# Patient Record
Sex: Female | Born: 1950
Health system: Southern US, Community
[De-identification: ages and names within clinical notes are randomized; demographics above are authoritative.]

## PROBLEM LIST (undated history)

## (undated) DIAGNOSIS — K219 Gastro-esophageal reflux disease without esophagitis: Secondary | ICD-10-CM

## (undated) DIAGNOSIS — B029 Zoster without complications: Secondary | ICD-10-CM

## (undated) DIAGNOSIS — I2510 Atherosclerotic heart disease of native coronary artery without angina pectoris: Secondary | ICD-10-CM

## (undated) DIAGNOSIS — M2550 Pain in unspecified joint: Secondary | ICD-10-CM

## (undated) DIAGNOSIS — R600 Localized edema: Secondary | ICD-10-CM

## (undated) DIAGNOSIS — G479 Sleep disorder, unspecified: Secondary | ICD-10-CM

## (undated) DIAGNOSIS — J439 Emphysema, unspecified: Secondary | ICD-10-CM

## (undated) DIAGNOSIS — E785 Hyperlipidemia, unspecified: Secondary | ICD-10-CM

## (undated) DIAGNOSIS — R0602 Shortness of breath: Secondary | ICD-10-CM

## (undated) DIAGNOSIS — M8580 Other specified disorders of bone density and structure, unspecified site: Secondary | ICD-10-CM

## (undated) DIAGNOSIS — K649 Unspecified hemorrhoids: Secondary | ICD-10-CM

## (undated) DIAGNOSIS — M549 Dorsalgia, unspecified: Secondary | ICD-10-CM

## (undated) DIAGNOSIS — K589 Irritable bowel syndrome without diarrhea: Secondary | ICD-10-CM

## (undated) DIAGNOSIS — I8390 Asymptomatic varicose veins of unspecified lower extremity: Secondary | ICD-10-CM

## (undated) DIAGNOSIS — E559 Vitamin D deficiency, unspecified: Secondary | ICD-10-CM

## (undated) DIAGNOSIS — J449 Chronic obstructive pulmonary disease, unspecified: Secondary | ICD-10-CM

## (undated) DIAGNOSIS — E039 Hypothyroidism, unspecified: Secondary | ICD-10-CM

## (undated) DIAGNOSIS — I10 Essential (primary) hypertension: Secondary | ICD-10-CM

## (undated) DIAGNOSIS — R609 Edema, unspecified: Secondary | ICD-10-CM

## (undated) DIAGNOSIS — M1990 Unspecified osteoarthritis, unspecified site: Secondary | ICD-10-CM

## (undated) DIAGNOSIS — I509 Heart failure, unspecified: Secondary | ICD-10-CM

## (undated) DIAGNOSIS — R251 Tremor, unspecified: Secondary | ICD-10-CM

## (undated) DIAGNOSIS — H3530 Unspecified macular degeneration: Secondary | ICD-10-CM

## (undated) HISTORY — PX: COLONOSCOPY: SHX174

## (undated) HISTORY — PX: OVARIAN CYST REMOVAL: SHX89

## (undated) HISTORY — PX: JOINT REPLACEMENT: SHX530

## (undated) HISTORY — DX: Localized edema: R60.0

## (undated) HISTORY — DX: Emphysema, unspecified: J43.9

## (undated) HISTORY — DX: Chronic obstructive pulmonary disease, unspecified: J44.9

## (undated) HISTORY — DX: Shortness of breath: R06.02

## (undated) HISTORY — DX: Other specified disorders of bone density and structure, unspecified site: M85.80

## (undated) HISTORY — DX: Unspecified macular degeneration: H35.30

## (undated) HISTORY — DX: Dorsalgia, unspecified: M54.9

## (undated) HISTORY — DX: Unspecified osteoarthritis, unspecified site: M19.90

## (undated) HISTORY — DX: Unspecified hemorrhoids: K64.9

## (undated) HISTORY — PX: INCISION / DRAINAGE HAND / FINGER: SUR695

## (undated) HISTORY — DX: Vitamin D deficiency, unspecified: E55.9

## (undated) HISTORY — DX: Pain in unspecified joint: M25.50

## (undated) HISTORY — DX: Irritable bowel syndrome, unspecified: K58.9

## (undated) HISTORY — DX: Gastro-esophageal reflux disease without esophagitis: K21.9

## (undated) HISTORY — DX: Hypothyroidism, unspecified: E03.9

## (undated) HISTORY — PX: CATARACT EXTRACTION: SUR2

## (undated) HISTORY — PX: ROTATOR CUFF REPAIR: SHX139

## (undated) HISTORY — PX: ESOPHAGOGASTRODUODENOSCOPY: SHX1529

## (undated) HISTORY — DX: Hyperlipidemia, unspecified: E78.5

## (undated) HISTORY — DX: Zoster without complications: B02.9

## (undated) HISTORY — DX: Asymptomatic varicose veins of unspecified lower extremity: I83.90

---
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
## 1992-08-17 HISTORY — PX: CHOLECYSTECTOMY: SHX55

## 1998-02-27 ENCOUNTER — Ambulatory Visit (HOSPITAL_COMMUNITY): Admission: RE | Admit: 1998-02-27 | Discharge: 1998-02-27 | Payer: Self-pay | Admitting: Obstetrics & Gynecology

## 1998-05-13 ENCOUNTER — Other Ambulatory Visit: Admission: RE | Admit: 1998-05-13 | Discharge: 1998-05-13 | Payer: Self-pay | Admitting: Obstetrics & Gynecology

## 1998-08-17 HISTORY — PX: ABDOMINAL HYSTERECTOMY: SHX81

## 2002-08-17 DIAGNOSIS — B029 Zoster without complications: Secondary | ICD-10-CM

## 2002-08-17 HISTORY — PX: ERCP: SHX60

## 2002-08-17 HISTORY — DX: Zoster without complications: B02.9

## 2006-11-24 ENCOUNTER — Ambulatory Visit (HOSPITAL_COMMUNITY): Admission: RE | Admit: 2006-11-24 | Discharge: 2006-11-24 | Payer: Self-pay | Admitting: Gastroenterology

## 2007-01-06 ENCOUNTER — Ambulatory Visit: Payer: Self-pay | Admitting: Internal Medicine

## 2007-01-27 ENCOUNTER — Encounter: Payer: Self-pay | Admitting: Internal Medicine

## 2007-01-27 ENCOUNTER — Ambulatory Visit: Payer: Self-pay | Admitting: Internal Medicine

## 2007-01-27 DIAGNOSIS — K2100 Gastro-esophageal reflux disease with esophagitis, without bleeding: Secondary | ICD-10-CM | POA: Insufficient documentation

## 2007-06-10 ENCOUNTER — Ambulatory Visit (HOSPITAL_COMMUNITY): Admission: RE | Admit: 2007-06-10 | Discharge: 2007-06-10 | Payer: Self-pay | Admitting: Orthopedic Surgery

## 2007-07-27 ENCOUNTER — Emergency Department (HOSPITAL_COMMUNITY): Admission: EM | Admit: 2007-07-27 | Discharge: 2007-07-27 | Payer: Self-pay | Admitting: Family Medicine

## 2008-01-06 ENCOUNTER — Telehealth: Payer: Self-pay | Admitting: Internal Medicine

## 2008-04-30 ENCOUNTER — Emergency Department (HOSPITAL_COMMUNITY): Admission: EM | Admit: 2008-04-30 | Discharge: 2008-04-30 | Payer: Self-pay | Admitting: Emergency Medicine

## 2008-11-06 ENCOUNTER — Ambulatory Visit (HOSPITAL_COMMUNITY): Admission: RE | Admit: 2008-11-06 | Discharge: 2008-11-06 | Payer: Self-pay | Admitting: Orthopedic Surgery

## 2008-11-07 ENCOUNTER — Inpatient Hospital Stay (HOSPITAL_COMMUNITY): Admission: RE | Admit: 2008-11-07 | Discharge: 2008-11-09 | Payer: Self-pay | Admitting: Orthopedic Surgery

## 2009-01-31 ENCOUNTER — Ambulatory Visit (HOSPITAL_COMMUNITY): Admission: RE | Admit: 2009-01-31 | Discharge: 2009-01-31 | Payer: Self-pay | Admitting: Gastroenterology

## 2009-01-31 ENCOUNTER — Encounter (INDEPENDENT_AMBULATORY_CARE_PROVIDER_SITE_OTHER): Payer: Self-pay | Admitting: Gastroenterology

## 2009-05-14 ENCOUNTER — Emergency Department (HOSPITAL_COMMUNITY): Admission: EM | Admit: 2009-05-14 | Discharge: 2009-05-14 | Payer: Self-pay | Admitting: Family Medicine

## 2009-10-28 ENCOUNTER — Emergency Department (HOSPITAL_COMMUNITY): Admission: EM | Admit: 2009-10-28 | Discharge: 2009-10-28 | Payer: Self-pay | Admitting: Emergency Medicine

## 2009-11-18 ENCOUNTER — Ambulatory Visit (HOSPITAL_COMMUNITY): Admission: RE | Admit: 2009-11-18 | Discharge: 2009-11-18 | Payer: Self-pay | Admitting: Orthopedic Surgery

## 2009-12-13 ENCOUNTER — Ambulatory Visit (HOSPITAL_COMMUNITY): Admission: RE | Admit: 2009-12-13 | Discharge: 2009-12-13 | Payer: Self-pay | Admitting: Orthopedic Surgery

## 2009-12-18 ENCOUNTER — Encounter: Admission: RE | Admit: 2009-12-18 | Discharge: 2010-03-10 | Payer: Self-pay | Admitting: Orthopedic Surgery

## 2010-02-26 ENCOUNTER — Ambulatory Visit (HOSPITAL_COMMUNITY): Admission: RE | Admit: 2010-02-26 | Discharge: 2010-02-26 | Payer: Self-pay | Admitting: Orthopedic Surgery

## 2010-06-17 HISTORY — PX: TOTAL HIP ARTHROPLASTY: SHX124

## 2010-06-18 ENCOUNTER — Inpatient Hospital Stay (HOSPITAL_COMMUNITY)
Admission: RE | Admit: 2010-06-18 | Discharge: 2010-06-22 | Payer: Self-pay | Source: Home / Self Care | Admitting: Orthopedic Surgery

## 2010-07-24 ENCOUNTER — Encounter
Admission: RE | Admit: 2010-07-24 | Discharge: 2010-08-14 | Payer: Self-pay | Source: Home / Self Care | Attending: Orthopedic Surgery | Admitting: Orthopedic Surgery

## 2010-08-18 ENCOUNTER — Encounter
Admission: RE | Admit: 2010-08-18 | Discharge: 2010-09-16 | Payer: Self-pay | Source: Home / Self Care | Attending: Orthopedic Surgery | Admitting: Orthopedic Surgery

## 2010-08-22 ENCOUNTER — Encounter: Admit: 2010-08-22 | Payer: Self-pay | Admitting: Orthopedic Surgery

## 2010-08-26 ENCOUNTER — Encounter: Admit: 2010-08-26 | Payer: Self-pay | Admitting: Orthopedic Surgery

## 2010-08-29 ENCOUNTER — Encounter: Admit: 2010-08-29 | Payer: Self-pay | Admitting: Orthopedic Surgery

## 2010-09-07 ENCOUNTER — Encounter: Payer: Self-pay | Admitting: Gastroenterology

## 2010-09-15 ENCOUNTER — Encounter
Admit: 2010-09-15 | Discharge: 2010-09-16 | Payer: Self-pay | Attending: Orthopedic Surgery | Admitting: Orthopedic Surgery

## 2010-09-17 ENCOUNTER — Ambulatory Visit: Payer: Commercial Managed Care - PPO | Attending: Orthopedic Surgery | Admitting: Physical Therapy

## 2010-09-17 ENCOUNTER — Ambulatory Visit: Payer: Commercial Managed Care - PPO | Admitting: Physical Therapy

## 2010-09-17 ENCOUNTER — Ambulatory Visit: Payer: Self-pay | Admitting: Physical Therapy

## 2010-09-17 ENCOUNTER — Encounter: Payer: Self-pay | Admitting: Physical Therapy

## 2010-09-17 DIAGNOSIS — R262 Difficulty in walking, not elsewhere classified: Secondary | ICD-10-CM | POA: Insufficient documentation

## 2010-09-17 DIAGNOSIS — IMO0001 Reserved for inherently not codable concepts without codable children: Secondary | ICD-10-CM | POA: Insufficient documentation

## 2010-09-17 DIAGNOSIS — M25579 Pain in unspecified ankle and joints of unspecified foot: Secondary | ICD-10-CM | POA: Insufficient documentation

## 2010-09-22 ENCOUNTER — Encounter: Payer: Self-pay | Admitting: Physical Therapy

## 2010-09-24 ENCOUNTER — Ambulatory Visit: Payer: Commercial Managed Care - PPO | Admitting: Physical Therapy

## 2010-09-24 ENCOUNTER — Encounter: Payer: Self-pay | Admitting: Physical Therapy

## 2010-10-28 LAB — BASIC METABOLIC PANEL WITH GFR
BUN: 9 mg/dL (ref 6–23)
CO2: 30 meq/L (ref 19–32)
Calcium: 8.5 mg/dL (ref 8.4–10.5)
Chloride: 105 meq/L (ref 96–112)
Creatinine, Ser: 0.87 mg/dL (ref 0.4–1.2)
GFR calc non Af Amer: >60 mL/min
Glucose, Bld: 106 mg/dL — ABNORMAL HIGH (ref 70–99)
Potassium: 4.4 meq/L (ref 3.5–5.1)
Sodium: 140 meq/L (ref 135–145)

## 2010-10-28 LAB — CBC
HCT: 31.9 % — ABNORMAL LOW (ref 36.0–46.0)
HCT: 32.5 % — ABNORMAL LOW (ref 36.0–46.0)
Hemoglobin: 10.1 g/dL — ABNORMAL LOW (ref 12.0–15.0)
Hemoglobin: 10.9 g/dL — ABNORMAL LOW (ref 12.0–15.0)
Hemoglobin: 11.2 g/dL — ABNORMAL LOW (ref 12.0–15.0)
MCH: 31.6 pg (ref 26.0–34.0)
MCH: 32.2 pg (ref 26.0–34.0)
MCHC: 33.9 g/dL (ref 30.0–36.0)
MCHC: 34.2 g/dL (ref 30.0–36.0)
MCV: 93.2 fL (ref 78.0–100.0)
MCV: 93.4 fL (ref 78.0–100.0)
MCV: 93.4 fL (ref 78.0–100.0)
RBC: 3.2 MIL/uL — ABNORMAL LOW (ref 3.87–5.11)
RBC: 3.48 MIL/uL — ABNORMAL LOW (ref 3.87–5.11)
RDW: 13.1 % (ref 11.5–15.5)

## 2010-10-28 LAB — TYPE AND SCREEN
ABO/RH(D): B POS
Antibody Screen: NEGATIVE

## 2010-10-28 LAB — PROTIME-INR
INR: 1.08 (ref 0.00–1.49)
INR: 2.27 — ABNORMAL HIGH (ref 0.00–1.49)

## 2010-10-28 LAB — BASIC METABOLIC PANEL
BUN: 10 mg/dL (ref 6–23)
CO2: 28 mEq/L (ref 19–32)
Chloride: 105 mEq/L (ref 96–112)
Chloride: 108 mEq/L (ref 96–112)
GFR calc Af Amer: >60 mL/min (ref >60)
GFR calc non Af Amer: >60 mL/min (ref >60)
Glucose, Bld: 100 mg/dL — ABNORMAL HIGH (ref 70–99)
Glucose, Bld: 117 mg/dL — ABNORMAL HIGH (ref 70–99)
Potassium: 6 mEq/L — ABNORMAL HIGH (ref 3.5–5.1)
Sodium: 138 mEq/L (ref 135–145)
Sodium: 139 mEq/L (ref 135–145)

## 2010-10-28 LAB — HEMOGLOBIN AND HEMATOCRIT, BLOOD
HCT: 31.8 % — ABNORMAL LOW (ref 36.0–46.0)
Hemoglobin: 10.8 g/dL — ABNORMAL LOW (ref 12.0–15.0)

## 2010-10-28 LAB — URINALYSIS, ROUTINE W REFLEX MICROSCOPIC
Bilirubin Urine: NEGATIVE
Glucose, UA: NEGATIVE mg/dL
Ketones, ur: NEGATIVE mg/dL
pH: 5.5 (ref 5.0–8.0)

## 2010-10-28 LAB — ABO/RH: ABO/RH(D): B POS

## 2010-10-29 LAB — CBC
HCT: 43 % (ref 36.0–46.0)
MCV: 92.2 fL (ref 78.0–100.0)
Platelets: 296 10*3/uL (ref 150–400)
RBC: 4.66 MIL/uL (ref 3.87–5.11)
WBC: 6.6 10*3/uL (ref 4.0–10.5)

## 2010-10-29 LAB — URINALYSIS, ROUTINE W REFLEX MICROSCOPIC
Bilirubin Urine: NEGATIVE
Glucose, UA: NEGATIVE mg/dL
Hgb urine dipstick: NEGATIVE
Specific Gravity, Urine: 1.022 (ref 1.005–1.030)
Urobilinogen, UA: 0.2 mg/dL (ref 0.0–1.0)
pH: 5.5 (ref 5.0–8.0)

## 2010-10-29 LAB — URINE MICROSCOPIC-ADD ON

## 2010-10-29 LAB — COMPREHENSIVE METABOLIC PANEL
ALT: 15 U/L (ref 0–35)
Albumin: 3.8 g/dL (ref 3.5–5.2)
Alkaline Phosphatase: 92 U/L (ref 39–117)
BUN: 14 mg/dL (ref 6–23)
Chloride: 104 mEq/L (ref 96–112)
Glucose, Bld: 91 mg/dL (ref 70–99)
Potassium: 4.6 mEq/L (ref 3.5–5.1)
Sodium: 138 mEq/L (ref 135–145)
Total Bilirubin: 0.4 mg/dL (ref 0.3–1.2)

## 2010-10-29 LAB — SURGICAL PCR SCREEN
MRSA, PCR: NEGATIVE
Staphylococcus aureus: NEGATIVE

## 2010-10-29 LAB — PROTIME-INR: INR: 0.96 (ref 0.00–1.49)

## 2010-11-17 ENCOUNTER — Other Ambulatory Visit: Payer: Self-pay | Admitting: Obstetrics & Gynecology

## 2010-11-17 DIAGNOSIS — R928 Other abnormal and inconclusive findings on diagnostic imaging of breast: Secondary | ICD-10-CM

## 2010-11-18 ENCOUNTER — Ambulatory Visit
Admission: RE | Admit: 2010-11-18 | Discharge: 2010-11-18 | Disposition: A | Discharge status: Home / Self Care | Payer: Commercial Managed Care - PPO | Source: Ambulatory Visit | Attending: Obstetrics & Gynecology | Admitting: Obstetrics & Gynecology

## 2010-11-18 DIAGNOSIS — R928 Other abnormal and inconclusive findings on diagnostic imaging of breast: Secondary | ICD-10-CM

## 2010-11-27 LAB — GRAM STAIN

## 2010-11-27 LAB — ANAEROBIC CULTURE

## 2010-11-27 LAB — WOUND CULTURE: Culture: NO GROWTH

## 2010-11-27 LAB — CBC
HCT: 41.4 % (ref 36.0–46.0)
Hemoglobin: 14.1 g/dL (ref 12.0–15.0)
MCHC: 34 g/dL (ref 30.0–36.0)
MCV: 92.4 fL (ref 78.0–100.0)
Platelets: 243 10*3/uL (ref 150–400)
RBC: 4.48 MIL/uL (ref 3.87–5.11)
RDW: 13.5 % (ref 11.5–15.5)
WBC: 7 10*3/uL (ref 4.0–10.5)

## 2010-12-30 NOTE — Op Note (Signed)
NAMEMARGARETMARY, [name]               ACCOUNT NO.:  [id number]   MEDICAL RECORD NO.:  [id number]          PATIENT TYPE:  AMB   LOCATION:  ENDO                         FACILITY:  [facility]   PHYSICIAN:  Anselmo Rod, M.D.  DATE OF BIRTH:  Nov 26, 1950   DATE OF PROCEDURE:  01/31/2009  DATE OF DISCHARGE:  01/31/2009                               OPERATIVE REPORT   PROCEDURE PERFORMED:  Colonoscopy with snare polypectomy x 2.   ENDOSCOPIST:  Anselmo Rod, M.D.   INSTRUMENT USED:  Pentax video colonoscope.   INDICATIONS FOR PROCEDURE:  A 60 year old white female with a personal  history of colonic polyps, underwent surveillance colonoscopy to rule  out recurrent colonic polyps.   PREPROCEDURE PREPARATION:  Informed consent was procured from the  patient. The patient was fasted for 4 hours prior to the procedure and  prepped with Moviprep the night of and the morning of the procedure.  Risks and benefits of the procedure including a 10% miss rate of polyps  and cancer were discussed with the patient as well.   PREPROCEDURE PHYSICAL:  The patient had stable vital signs.  NECK:  Supple.  CHEST:  Clear to auscultation. S1-S2 regular.  ABDOMEN:  Soft with normal bowel sounds.   DESCRIPTION OF PROCEDURE:  The patient was placed in a left lateral  decubitus position and sedated with 325 mg of Propofol, 500 mcg of  Alfentanil and 2.5 mg of Versed.  Once the patient was adequately  sedated and maintained on low-flow oxygen and continuous cardiac  monitoring, the Pentax video colonoscope was advanced from the rectum to  the cecum without difficulty.  The patient had a good prep.  The  appendiceal orifice and ileocecal valve were clearly visualized and  photographed.  The terminal ileum appeared healthy.  The ascending  colon, transverse colon, left colon, and the rectosigmoid colon appeared  normal.  Two small flat rectal polyps were removed by hot snare  (150/50).  Six small sessile rectal  polyps were ablated with the tip of  the snare.  Retroflexion in the rectum revealed no abnormalities.  The  patient tolerated the procedure well without any immediate  complications.   IMPRESSION:  Two rectal polyps removed by hot snare and 6 small sessile  rectal polyps ablated with the tip of the snare.  Otherwise, normal  colonoscopy to the terminal ileum.   RECOMMENDATIONS:  1. Await pathology results.  2. Avoid all nonsteroidals including aspirin for the next 2 weeks.  3. Repeat colonoscopy depending on pathology results, most probably      within the next 5 years  4. Outpatient followup as need arises in the future.      Anselmo Rod, M.D.  Electronically Signed     JNM/MEDQ  D:  02/01/2009  T:  02/01/2009  Job:  [id number]   cc:   Gretta Arab. Valentina Lucks, M.D.  Fax: [phone]

## 2010-12-30 NOTE — Op Note (Signed)
Annette [name], Annette [name]               ACCOUNT NO.:  [id number]   MEDICAL RECORD NO.:  [id number]          PATIENT TYPE:  INP   LOCATION:  5016                         FACILITY:  [facility]   PHYSICIAN:  Artist Pais. Weingold, M.D.DATE OF BIRTH:  12-15-1950   DATE OF PROCEDURE:  11/07/2008  DATE OF DISCHARGE:                               OPERATIVE REPORT   PREOPERATIVE DIAGNOSIS:  Left index finger flexor sheath infection.   POSTOPERATIVE DIAGNOSIS:  Left index finger flexor sheath infection.   PROCEDURE:  Incision and drainage, left index finger flexor sheath with  cultures and placement of indwelling catheter.   SURGEON:  Artist Pais. Mina Marble, MD   ASSISTANT:  None.   ANESTHESIA:  General.   TOURNIQUET TIME:  21 minutes.   COMPLICATIONS:  No complication.   DRAINS:  No drains.   One indwelling catheter placed.   Cultures x2 sent.   OPERATIVE REPORT:  The patient was taken to the operating suite.  After  induction of adequate general anesthesia, left upper extremity was  prepped and draped in usual sterile fashion.  An Esmarch was used to  exsanguinate the limb.  Tourniquet was then inflated to 250 mmHg.  At  this point in time, an incision was made over the A1 pulley area of the  left index finger.  Skin was incised.  Neurovascular bundle was  carefully identified and retracted.  Once the flexor sheath was entered,  a cloudy fluid was encountered, and it was cultured.  The edge of the A1  pulley was identified.  At this point in time, an incision was made mid  lateral over the ulnar side of the distal interphalangeal joint, index  finger, left.  Dissection was carried down the flexor sheath and just  distal to the A4 pulley, sheath was entered.  Cloudy fluid was seen  there as well.  At this point in time, a #5 pediatric feeding tube was  placed in the flexor sheath, and the flexor sheath was irrigated with  300 mL of normal saline.  Also, this was  then followed by 10 mL of  Marcaine.  The proximal wound was closed with  4-0 nylon as well as the distal wound loosely to allow drainage.  The  patient was then dressed with Xeroform, 4x4s fluffs, and a compressive  dressing.  The patient tolerated the procedure well and taken to  recovery in stable fashion.      Artist Pais Mina Marble, M.D.  Electronically Signed     MAW/MEDQ  D:  11/07/2008  T:  11/08/2008  Job:  [id number]

## 2010-12-30 NOTE — Assessment & Plan Note (Signed)
[facility]                         GASTROENTEROLOGY OFFICE NOTE   SHAWNE, BULOW                      MRN:          [id number]  DATE:01/06/2007                            DOB:          03/07/1951    [name] is a 60 year old, white female.  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  She comes today with several GI  issues.  One is loose stools like sludge that occur two to three times a  day which is different from her usual bowel habits of having formed  stool daily.  She also has bouts of diarrhea.  This has been occurring  now off and on for several months.  Another complaint has been increased  gastroesophageal reflux which occurs mostly at night and wakes her up.  This usually results in esophageal spasm.  Several years ago, cardiac  evaluation of chest pain did not show any primary cardiac problems and  the patient was diagnosed with esophageal spasm.  She occasionally takes  Pepcid AC or antacids.  Her weight has recently increased from her usual  165 pounds to 197 pounds since she stopped smoking 8 months ago.  She  has had increased stress with her elderly parents.  The third problem  has been a right upper quadrant pressure which occurs 1-2 days at a time  and results in loose stools.  This is localized anteriorly and does not  radiate to the back.  [name] has a history of jaundice which  occurred in 2004.  She recalled uncomplicated cholecystectomy for  cholelithiasis in 1992, and underwent ERCP in 2004, with findings of a  sludge in the common bile duct.  It is not clear whether she had a  sphincterotomy at that time.  She has not had any recurrence of the  symptoms since then.  Upper endoscopy in 1992, apparently was normal.   MEDICATIONS:  None.   PAST MEDICAL HISTORY:  Arthritis.   PAST SURGICAL HISTORY:  1. Cholecystectomy.  2. Hysterectomy.  3. Inguinal hernia repair.   FAMILY HISTORY:   Positive for heart disease in her mother and father.  Prostate cancer in father.  Breast cancer in sister.  Alcoholism in  mother.   SOCIAL HISTORY:  Married with one child.  She works as a Engineer, civil (consulting).  She  smokes.  She drinks alcohol rarely.   REVIEW OF SYSTEMS:  Positive for skin rash, night sweats, new headaches,  no chills or musculoskeletal pains.   PHYSICAL EXAMINATION:  VITAL SIGNS:  Blood pressure 118/82, pulse 64,  weight 197 pounds.  GENERAL:  She was alert and oriented in no distress.  LUNGS:  No cough or hoarseness.  Clear to auscultation.  CARDIAC:  S1, S2.  ABDOMEN:  Soft with normoactive bowel sounds.  Mild tenderness in the  left lower quadrant above the inguinal area.  No recurrent hernia.  No  distention.  Liver edge at costal margin.  Post laparoscopic  cholecystectomy scar well-healed.  RECTAL:  Normal rectal tone.  Stool was Hemoccult negative.   IMPRESSION:  82. A 60 year old,  white female with variety of digestive symptoms.      She has a gastroesophageal reflux disease which needs to be treated      with proton pump inhibitor.  This could have been precipitated by      stress as well as by weight gain.  2. Right upper quadrant discomfort.  This could be due to irritable      bowel syndrome.  There is a history of jaundice due to extrahepatic      biliary problems.  This should have been taken care of by      endoscopic retrograde cholangiopancreatography and possible      sphincterotomy.  We need to obtain the records from [place] to      review that.  3. Change in bowel habits.  This needs to be further evaluated.      Recent CT scan of the abdomen showed questionable swelling or edema      of the small bowel wall suggestive of inflammatory bowel disease,      but her terminal ileum was normal.  Her ultrasound of the upper      abdomen within the last 3 weeks was unremarkable with normal size      common bile duct.   PLAN:  1. Nexium 40 mg daily.  Samples  as well as prescription given.  2. Levsin sublingual 0.25 mg every 4-6 hours p.r.n. abdominal      discomfort.  3. Request records from ERCP and endoscopy from [facility].  4. Schedule upper endoscopy.  5. Chantix package x3 months to stop smoking.  6. Xanax 0.25 mg dispense #30, one p.o. p.r.n.  7. She may need further evaluation depending on the review of the      records from [facility].     Dora M. Juanda Chance, MD  Electronically Signed    DMB/MedQ  DD: 01/06/2007  DT: 01/06/2007  Job #: [id number]

## 2011-09-11 ENCOUNTER — Encounter: Payer: Self-pay | Admitting: Gastroenterology

## 2011-10-15 ENCOUNTER — Other Ambulatory Visit: Payer: Self-pay | Admitting: Obstetrics & Gynecology

## 2011-10-15 DIAGNOSIS — N6009 Solitary cyst of unspecified breast: Secondary | ICD-10-CM

## 2011-11-23 ENCOUNTER — Ambulatory Visit
Admission: RE | Admit: 2011-11-23 | Discharge: 2011-11-23 | Disposition: A | Discharge status: Home / Self Care | Payer: 59 | Source: Ambulatory Visit | Attending: Obstetrics & Gynecology | Admitting: Obstetrics & Gynecology

## 2011-11-23 DIAGNOSIS — N6009 Solitary cyst of unspecified breast: Secondary | ICD-10-CM

## 2012-03-15 ENCOUNTER — Telehealth: Payer: Self-pay

## 2012-03-15 NOTE — Telephone Encounter (Signed)
Delorse Lek (now known as Leanord Hawking). 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 Has had colonoscopy previously with Dr. Charna Elizabeth, feels she will be due for another in about a year. Can you get records sent from Dr. Kenna Gilbert office, previous colonoscopies, any associated pathology reports for me to review, determine proper timing of next exam. Her contact number is [phone] Thanks    Pt needs to fill out Release Left message on machine to call back

## 2012-03-17 NOTE — Telephone Encounter (Signed)
The pt returned call and will have records sent here for review

## 2012-04-19 ENCOUNTER — Other Ambulatory Visit: Payer: Self-pay | Admitting: Obstetrics & Gynecology

## 2012-04-19 DIAGNOSIS — N6489 Other specified disorders of breast: Secondary | ICD-10-CM

## 2012-04-19 DIAGNOSIS — N6459 Other signs and symptoms in breast: Secondary | ICD-10-CM

## 2012-04-21 ENCOUNTER — Ambulatory Visit
Admission: RE | Admit: 2012-04-21 | Discharge: 2012-04-21 | Disposition: A | Discharge status: Home / Self Care | Payer: 59 | Source: Ambulatory Visit | Attending: Obstetrics & Gynecology | Admitting: Obstetrics & Gynecology

## 2012-04-21 DIAGNOSIS — N6459 Other signs and symptoms in breast: Secondary | ICD-10-CM

## 2012-04-21 DIAGNOSIS — N6489 Other specified disorders of breast: Secondary | ICD-10-CM

## 2012-09-13 ENCOUNTER — Other Ambulatory Visit (HOSPITAL_COMMUNITY): Payer: Self-pay | Admitting: Orthopedic Surgery

## 2012-09-13 DIAGNOSIS — T84038A Mechanical loosening of other internal prosthetic joint, initial encounter: Secondary | ICD-10-CM

## 2012-09-13 DIAGNOSIS — M25551 Pain in right hip: Secondary | ICD-10-CM

## 2012-09-22 ENCOUNTER — Encounter (HOSPITAL_COMMUNITY)
Admission: RE | Admit: 2012-09-22 | Discharge: 2012-09-22 | Disposition: A | Discharge status: Home / Self Care | Payer: 59 | Source: Ambulatory Visit | Attending: Orthopedic Surgery | Admitting: Orthopedic Surgery

## 2012-09-22 DIAGNOSIS — Z96649 Presence of unspecified artificial hip joint: Secondary | ICD-10-CM | POA: Insufficient documentation

## 2012-09-22 DIAGNOSIS — T84038A Mechanical loosening of other internal prosthetic joint, initial encounter: Secondary | ICD-10-CM

## 2012-09-22 DIAGNOSIS — M25559 Pain in unspecified hip: Secondary | ICD-10-CM | POA: Insufficient documentation

## 2012-09-22 DIAGNOSIS — M25551 Pain in right hip: Secondary | ICD-10-CM

## 2012-09-22 MED ORDER — TECHNETIUM TC 99M MEDRONATE IV KIT
25.0000 | PACK | Freq: Once | INTRAVENOUS | Status: AC | PRN
  Administered 2012-09-22: 25 via INTRAVENOUS

## 2012-12-21 ENCOUNTER — Other Ambulatory Visit (HOSPITAL_COMMUNITY): Payer: Self-pay | Admitting: Orthopedic Surgery

## 2012-12-21 DIAGNOSIS — M25551 Pain in right hip: Secondary | ICD-10-CM

## 2012-12-24 ENCOUNTER — Ambulatory Visit (HOSPITAL_COMMUNITY): Payer: 59

## 2012-12-26 ENCOUNTER — Ambulatory Visit (HOSPITAL_COMMUNITY): Payer: 59

## 2013-01-25 ENCOUNTER — Other Ambulatory Visit (HOSPITAL_COMMUNITY): Payer: Self-pay | Admitting: Orthopedic Surgery

## 2013-01-25 DIAGNOSIS — M25551 Pain in right hip: Secondary | ICD-10-CM

## 2013-01-27 ENCOUNTER — Ambulatory Visit (HOSPITAL_COMMUNITY)
Admission: RE | Admit: 2013-01-27 | Discharge: 2013-01-27 | Disposition: A | Discharge status: Home / Self Care | Payer: 59 | Source: Ambulatory Visit | Attending: Orthopedic Surgery | Admitting: Orthopedic Surgery

## 2013-01-27 DIAGNOSIS — M25551 Pain in right hip: Secondary | ICD-10-CM

## 2013-01-27 DIAGNOSIS — Z96649 Presence of unspecified artificial hip joint: Secondary | ICD-10-CM | POA: Insufficient documentation

## 2013-01-27 DIAGNOSIS — M1610 Unilateral primary osteoarthritis, unspecified hip: Secondary | ICD-10-CM | POA: Insufficient documentation

## 2013-01-27 DIAGNOSIS — M169 Osteoarthritis of hip, unspecified: Secondary | ICD-10-CM | POA: Insufficient documentation

## 2013-01-27 DIAGNOSIS — M25559 Pain in unspecified hip: Secondary | ICD-10-CM | POA: Insufficient documentation

## 2013-06-01 ENCOUNTER — Other Ambulatory Visit (INDEPENDENT_AMBULATORY_CARE_PROVIDER_SITE_OTHER): Payer: 59

## 2013-06-01 ENCOUNTER — Ambulatory Visit (INDEPENDENT_AMBULATORY_CARE_PROVIDER_SITE_OTHER): Payer: 59 | Admitting: Internal Medicine

## 2013-06-01 ENCOUNTER — Other Ambulatory Visit: Payer: 59

## 2013-06-01 ENCOUNTER — Encounter: Payer: Self-pay | Admitting: Internal Medicine

## 2013-06-01 VITALS — BP 110/70 | HR 66 | Ht 67.0 in | Wt 183.0 lb

## 2013-06-01 DIAGNOSIS — R198 Other specified symptoms and signs involving the digestive system and abdomen: Secondary | ICD-10-CM

## 2013-06-01 DIAGNOSIS — R1915 Other abnormal bowel sounds: Secondary | ICD-10-CM

## 2013-06-01 DIAGNOSIS — R197 Diarrhea, unspecified: Secondary | ICD-10-CM

## 2013-06-01 DIAGNOSIS — R109 Unspecified abdominal pain: Secondary | ICD-10-CM

## 2013-06-01 LAB — CBC WITH DIFFERENTIAL/PLATELET
Basophils Absolute: 0.1 10*3/uL (ref 0.0–0.1)
Eosinophils Absolute: 0.4 10*3/uL (ref 0.0–0.7)
Lymphocytes Relative: 23.9 % (ref 12.0–46.0)
MCHC: 34 g/dL (ref 30.0–36.0)
Neutrophils Relative %: 63.1 % (ref 43.0–77.0)
RDW: 13.5 % (ref 11.5–14.6)

## 2013-06-01 NOTE — Progress Notes (Signed)
Subjective:    Patient ID: Annette [name], female    DOB: 11-Nov-1950, 62 y.o.   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 She has tried align with slight benefit at best. She cannot identify triggers and denies significant stressors (new). No bleeding. Has had a colonoscopy 2012 with diminutive rectal hyperplastic polyps otherwise NL into terminal ileum.  Milk products do not bother her and she does not reall use, has tried gluten-free w/o benefit.  She has had IBS problems when under significant stress - especially at time of hu  Allergies  Allergen Reactions  . Augmentin [Amoxicillin-Pot Clavulanate]   . Codeine Other (See Comments)    Causes her to stay awake   . Other     IV contrast    Outpatient Prescriptions Prior to Visit  Medication Sig Dispense Refill  . azithromycin (ZITHROMAX) 1 G powder Take 1 packet by mouth once.       No facility-administered medications prior to visit.   Past Medical History  Diagnosis Date  . Arthritis     Right hip, end stage  . Shingles 2004  . Varicose veins   . Pneumonia   . Gallbladder disease   . Measles     as a child  . Mumps     as a child   Past Surgical History  Procedure Laterality Date  . Esophagogastroduodenoscopy    . Abdominal hysterectomy  2000  . Rotator cuff repair      left  . Cholecystectomy  1994  . Ovarian cyst removal  [id number]  . Total hip arthroplasty  06/2010    right  . Colonoscopy    . Ercp  2004   History   Social History  . Marital Status: Divorced    Spouse Name: N/A    Number of Children: 1  . Years of Education: RN   Occupational History  . RN [employer]   Social History Main Topics  . Smoking status: Former Games developer  . Smokeless tobacco: None  . Alcohol Use: Yes     Comment: social  . Drug Use: No    Family History  Problem Relation Age of Onset  .  Alzheimer's disease Father   . Stroke Mother        Review of Systems This is positive for those things mentiones in the HPI, also positive for  Arthritis, insomnia, voice change. All other review of systems are negative.      Objective:   Physical Exam General:  Well-developed, well-nourished and in no acute distress Eyes:  anicteric. ENT:   Mouth and posterior pharynx free of lesions.  Neck:   supple w/o thyromegaly or mass.  Lungs: Clear to auscultation bilaterally. Heart:  S1S2, no rubs, murmurs, gallops. Abdomen:  soft, non-tender, no hepatosplenomegaly, hernia, or mass and BS+.  Extremities:   no edema Skin   no rash. Neuro:  A&O x 3.  Psych:  appropriate mood and  Affect.   Data Reviewed: Prior [facility] GI notes, colonoscopy, pathology, labs    Assessment & Plan:  Frequent loose stools - Plan: CBC with Differential, Ova and parasite screen, Clostridium Difficile by PCR, Fecal lactoferrin  Borborygmi - Plan: CBC with Differential, Ova and parasite screen, Clostridium Difficile by PCR, Fecal lactoferrin  Abdominal cramps - Plan: CBC with Differential, Ova and parasite screen, Clostridium Difficile by PCR, Fecal lactoferrin  1. Most likely has IBS exacerbation but will look for infection/inflammation. 2. Pending these results consider empiric Abx (? SIBO), anti-cholinergics, other w/u.

## 2013-06-01 NOTE — Patient Instructions (Signed)
Your physician has requested that you go to the basement for lab work before leaving today.     I appreciate the opportunity to care for you.   

## 2013-06-02 LAB — OVA AND PARASITE SCREEN: OP: NONE SEEN

## 2013-06-02 LAB — FECAL LACTOFERRIN, QUANT: Lactoferrin: NEGATIVE

## 2013-06-03 ENCOUNTER — Encounter: Payer: Self-pay | Admitting: Internal Medicine

## 2013-06-04 LAB — CLOSTRIDIUM DIFFICILE BY PCR: Toxigenic C. Difficile by PCR: NOT DETECTED

## 2013-06-05 ENCOUNTER — Other Ambulatory Visit: Payer: Self-pay

## 2013-06-05 MED ORDER — DICYCLOMINE HCL 10 MG PO CAPS: 10.0000 mg | ORAL_CAPSULE | Freq: Three times a day (TID) | ORAL | Status: DC

## 2013-06-05 NOTE — Progress Notes (Signed)
Quick Note:  Let her know all tests negative - no signs infection or inflammation Suspect IBS problem  Try dicyclomine 20 mg before meals #90 1 refill Ask her to let me know how that is working in 1-2 weeks (call me or tell me when she sees me) This is anti-spasmodic to slow things down after she eats if too strong (sleepy, dry mouth) take 1/2 tab and could even start that way ______

## 2013-12-21 ENCOUNTER — Encounter: Payer: Self-pay | Admitting: Internal Medicine

## 2013-12-21 ENCOUNTER — Ambulatory Visit (INDEPENDENT_AMBULATORY_CARE_PROVIDER_SITE_OTHER): Payer: 59 | Admitting: Internal Medicine

## 2013-12-21 VITALS — BP 118/84 | HR 80 | Ht 66.5 in | Wt 193.2 lb

## 2013-12-21 DIAGNOSIS — K648 Other hemorrhoids: Secondary | ICD-10-CM

## 2013-12-21 DIAGNOSIS — K589 Irritable bowel syndrome without diarrhea: Secondary | ICD-10-CM | POA: Insufficient documentation

## 2013-12-21 MED ORDER — HYDROCORTISONE ACETATE 25 MG RE SUPP: 25.0000 mg | Freq: Every day | RECTAL | Status: DC

## 2013-12-21 MED ORDER — ALOSETRON HCL 0.5 MG PO TABS: 0.5000 mg | ORAL_TABLET | Freq: Two times a day (BID) | ORAL | Status: DC

## 2013-12-21 NOTE — Assessment & Plan Note (Signed)
These are present in all 3 positions, grade 2, she has the largest one in the left lateral decubitus position seen on anoscopy. I think banding hemorrhoids is an option and she is willing to do this. For now [facility] suppositories and then control the IBS diarrhea and then if still having problems consider hemorrhoid banding.

## 2013-12-21 NOTE — Assessment & Plan Note (Signed)
Dicyclomine helps but she has significant side effects. I think the loose stools are contributing to hemorrhoid problems. We discussed using Lotronex and she will try 0.5 mg twice a day. Informed consent and signed, I reviewed that she needs to stop this if she becomes constipated. Further plans pending clinical course, she will contact me over the next few weeks with the results of this therapeutic trial.

## 2013-12-21 NOTE — Patient Instructions (Addendum)
Today you have read/signed a consent for Lotronex and been given a written rx for this.  Stop your generic bentyl.  We have sent the following medications to your pharmacy for you to pick up at your convenience: [facility]   Let Dr. Carlean Purl know how your doing when you see him at the hospital.   I appreciate the opportunity to care for you.

## 2013-12-21 NOTE — Progress Notes (Signed)
Subjective:    Patient ID: Annette [name], female    DOB: [birth year], 63 y.o.   MRN: [id number]  HPI The patient is here because of concerns about her rectal mass. She does have known hemorrhoids and swelling of the normal dose and is seeing some bright red blood on the tissue paper. She continues to have urgent postprandial defecation and loose stools it is helped by dicyclomine but dicyclomine makes her very sleepy. Her quality of life and social life is significantly impaired by this. She had a colonoscopy in 2010 with 2 rectal hyperplastic polyps. Allergies  Allergen Reactions  . Augmentin [Amoxicillin-Pot Clavulanate] Nausea And Vomiting  . Codeine Other (See Comments)    Causes her to stay awake   . Other Hives    IV contrast    Outpatient Prescriptions Prior to Visit  Medication Sig Dispense Refill  . levothyroxine (SYNTHROID, LEVOTHROID) 75 MCG tablet Take 75 mcg by mouth daily before breakfast.      . propranolol (INDERAL) 60 MG tablet Take 60 mg by mouth daily.      Marland Kitchen dicyclomine (BENTYL) 10 MG capsule Take 1 capsule (10 mg total) by mouth 3 (three) times daily before meals.  90 capsule  1   No facility-administered medications prior to visit.   Past Medical History  Diagnosis Date  . Arthritis     Right hip, end stage  . Shingles 2004  . Varicose veins   . Pneumonia   . Gallbladder disease   . Measles     as a child  . Mumps     as a child  . Hypothyroidism   . Irritable bowel syndrome   . Hemorrhoids    Past Surgical History  Procedure Laterality Date  . Esophagogastroduodenoscopy    . Abdominal hysterectomy  2000  . Rotator cuff repair      left  . Cholecystectomy  1994  . Ovarian cyst removal  [id number]  . Total hip arthroplasty  06/2010    right  . Colonoscopy    . Ercp  2004    sludge in CBD (jaundiced)   Review of Systems As above    Objective:   Physical Exam Well-developed nourished no acute distress  Rectal exam with Patti  Martinique CMA present demonstrates some anal tags, otherwise normal anoderm. There is soft stool in the vault, it is brown. No tenderness. Normal descent with Valsalva normal resting tone.  Anoscopy is performed after verbal informed consent and it demonstrates regular hemorrhoids in all positions the most prominent left lateral decubitus.       Assessment & Plan:  IBS (irritable bowel syndrome) Dicyclomine helps but she has significant side effects. I think the loose stools are contributing to hemorrhoid problems. We discussed using Lotronex and she will try 0.5 mg twice a day. Informed consent and signed, I reviewed that she needs to stop this if she becomes constipated. Further plans pending clinical course, she will contact me over the next few weeks with the results of this therapeutic trial.  Internal hemorrhoids with prolapse and bleeding These are present in all 3 positions, grade 2, she has the largest one in the left lateral decubitus position seen on anoscopy. I think banding hemorrhoids is an option and she is willing to do this. For now [facility] suppositories and then control the IBS diarrhea and then if still having problems consider hemorrhoid banding.    Meds ordered this encounter  Medications  . alosetron (LOTRONEX) 0.5  MG tablet    Sig: Take 1 tablet (0.5 mg total) by mouth 2 (two) times daily.    Dispense:  60 tablet    Refill:  1  . hydrocortisone (ANUSOL-HC) 25 MG suppository    Sig: Place 1 suppository (25 mg total) rectally at bedtime. X 5 days then as needed    Dispense:  24 suppository    Refill:  0   I appreciate the opportunity to care for this patient. CC: Osborne Casco, MD

## 2014-02-22 ENCOUNTER — Ambulatory Visit (INDEPENDENT_AMBULATORY_CARE_PROVIDER_SITE_OTHER): Payer: 59 | Admitting: Internal Medicine

## 2014-02-22 VITALS — BP 110/70 | HR 68 | Ht 66.5 in | Wt 196.2 lb

## 2014-02-22 DIAGNOSIS — K648 Other hemorrhoids: Secondary | ICD-10-CM

## 2014-02-22 DIAGNOSIS — K5900 Constipation, unspecified: Secondary | ICD-10-CM | POA: Insufficient documentation

## 2014-02-22 NOTE — Assessment & Plan Note (Signed)
LL banded today Benefiber +/- dulcolax , MiraLax

## 2014-02-22 NOTE — Patient Instructions (Addendum)
Today you have been given a handout on benefiber to read and follow.  Take 2 tablespoons daily.  You may use dulcolax as needed for constipation.  We will see you back at your next appointment.  HEMORRHOID BANDING PROCEDURE    FOLLOW-UP CARE   1. The procedure you have had should have been relatively painless since the banding of the area involved does not have nerve endings and there is no pain sensation.  The rubber band cuts off the blood supply to the hemorrhoid and the band may fall off as soon as 48 hours after the banding (the band may occasionally be seen in the toilet bowl following a bowel movement). You may notice a temporary feeling of fullness in the rectum which should respond adequately to plain Tylenol or Motrin.  2. Following the banding, avoid strenuous exercise that evening and resume full activity the next day.  A sitz bath (soaking in a warm tub) or bidet is soothing, and can be useful for cleansing the area after bowel movements.     3. To avoid constipation, take two tablespoons of natural wheat bran, natural oat bran, flax, Benefiber or any over the counter fiber supplement and increase your water intake to 7-8 glasses daily.    4. Unless you have been prescribed anorectal medication, do not put anything inside your rectum for two weeks: No suppositories, enemas, fingers, etc.  5. Occasionally, you may have more bleeding than usual after the banding procedure.  This is often from the untreated hemorrhoids rather than the treated one.  Don't be concerned if there is a tablespoon or so of blood.  If there is more blood than this, lie flat with your bottom higher than your head and apply an ice pack to the area. If the bleeding does not stop within a half an hour or if you feel faint, call our office at [phone] or go to the emergency room.  6. Problems are not common; however, if there is a substantial amount of bleeding, severe pain, chills, fever or difficulty  passing urine (very rare) or other problems, you should call us at [phone] or report to the nearest emergency room.  7. Do not stay seated continuously for more than 2-3 hours for a day or two after the procedure.  Tighten your buttock muscles 10-15 times every two hours and take 10-15 deep breaths every 1-2 hours.  Do not spend more than a few minutes on the toilet if you cannot empty your bowel; instead re-visit the toilet at a later time.       I appreciate the opportunity to care for you.

## 2014-02-22 NOTE — Assessment & Plan Note (Signed)
Change in IBS - benefiber +/- dulcolax

## 2014-02-22 NOTE — Progress Notes (Signed)
Patient ID: Annette [name], female   DOB: 01/07/1951, 63 y.o.   MRN: [id number]  The patient is here for hemorrhoidal ligation, hemorrhoids previously diagnosed. She was having IBS and diarrhea but is now been constipated for about a week or so.  PROCEDURE NOTE: The patient presents with symptomatic grade 2  hemorrhoids, requesting rubber band ligation of his/her hemorrhoidal disease.  All risks, benefits and alternative forms of therapy were described and informed consent was obtained.  The decision was made to band the LL internal hemorrhoid, and the [place] was used to perform band ligation without complication.  Digital anorectal examination was then performed to assure proper positioning of the band, and to adjust the banded tissue as required.  The patient was discharged home without pain or other issues.  Dietary and behavioral recommendations were given and along with follow-up instructions.     The following adjunctive treatments were recommended:  Benefiber for constipation, intermittent Dulcolax is okay, may need to move to MiraLax.  The patient will return July 30 for  follow-up and possible additional banding as required. No complications were encountered and the patient tolerated the procedure well.  I appreciate the opportunity to care for this patient. CC: Osborne Casco, MD

## 2014-04-04 ENCOUNTER — Encounter: Payer: 59 | Admitting: Internal Medicine

## 2014-04-12 ENCOUNTER — Encounter: Payer: 59 | Admitting: Internal Medicine

## 2014-05-18 ENCOUNTER — Other Ambulatory Visit (HOSPITAL_COMMUNITY): Payer: Self-pay | Admitting: Orthopedic Surgery

## 2014-05-18 DIAGNOSIS — T84030A Mechanical loosening of internal right hip prosthetic joint, initial encounter: Secondary | ICD-10-CM

## 2014-05-18 DIAGNOSIS — M8430XA Stress fracture, unspecified site, initial encounter for fracture: Secondary | ICD-10-CM

## 2014-05-31 ENCOUNTER — Encounter (HOSPITAL_COMMUNITY): Payer: 59

## 2014-12-11 ENCOUNTER — Other Ambulatory Visit: Payer: Self-pay | Admitting: Family Medicine

## 2014-12-11 ENCOUNTER — Ambulatory Visit
Admission: RE | Admit: 2014-12-11 | Discharge: 2014-12-11 | Disposition: A | Discharge status: Home / Self Care | Payer: Medicare Other | Source: Ambulatory Visit | Attending: Family Medicine | Admitting: Family Medicine

## 2014-12-11 DIAGNOSIS — R600 Localized edema: Secondary | ICD-10-CM

## 2015-02-11 ENCOUNTER — Other Ambulatory Visit: Payer: Self-pay

## 2015-03-05 ENCOUNTER — Ambulatory Visit: Payer: Self-pay | Admitting: Orthopedic Surgery

## 2015-03-05 NOTE — Progress Notes (Signed)
Preoperative surgical orders have been place into the Epic hospital system for Annette [name] on 03/05/2015, 12:55 PM  by Mickel Crow for surgery on 03-22-2015.  Preop Total Hip - Anterior Approach orders including IV Tylenol, and IV Decadron as long as there are no contraindications to the above medications. Arlee Muslim, PA-C

## 2015-03-14 NOTE — Patient Instructions (Addendum)
YOUR PROCEDURE IS SCHEDULED ON :  03/22/15  REPORT TO [facility] MAIN ENTRANCE FOLLOW SIGNS TO EAST ELEVATOR - GO TO 3rd FLOOR CHECK IN AT 3 EAST NURSES STATION (SHORT STAY) AT:  12:00 PM  CALL THIS NUMBER IF YOU HAVE PROBLEMS THE MORNING OF SURGERY [phone]  REMEMBER:ONLY 1 PER PERSON MAY GO TO SHORT STAY WITH YOU TO GET READY THE MORNING OF YOUR SURGERY  DO NOT EAT FOOD  AFTER MIDNIGHT  MAY HAVE CLEAR LIQUIDS UNTIL 8:30 AM  TAKE THESE MEDICINES THE MORNING OF SURGERY: PROPRANOLOL / LEVOTHYROXINE     CLEAR LIQUID DIET   Foods Allowed                                                                     Foods Excluded  Coffee and tea, regular and decaf                             liquids that you cannot  Plain Jell-O in any flavor                                             see through such as: Fruit ices (not with fruit pulp)                                     milk, soups, orange juice  Iced Popsicles                                                 All solid food Carbonated beverages, regular and diet                                    Cranberry, grape and apple juices Sports drinks like Gatorade Lightly seasoned clear broth or consume(fat free) Sugar, honey syrup  _____________________________________________________________________  YOU MAY NOT HAVE ANY METAL ON YOUR BODY INCLUDING HAIR PINS AND PIERCING'S. DO NOT WEAR JEWELRY, MAKEUP, LOTIONS, POWDERS OR PERFUMES. DO NOT WEAR NAIL POLISH. DO NOT SHAVE 48 HRS PRIOR TO SURGERY. MEN MAY SHAVE FACE AND NECK.  DO NOT [facility]. [facility] IS NOT RESPONSIBLE FOR VALUABLES.  CONTACTS, DENTURES OR PARTIALS MAY NOT BE WORN TO SURGERY. LEAVE SUITCASE IN CAR. CAN BE BROUGHT TO ROOM AFTER SURGERY.  PATIENTS DISCHARGED THE DAY OF SURGERY WILL NOT BE ALLOWED TO DRIVE HOME.  PLEASE READ OVER THE FOLLOWING INSTRUCTION  SHEETS _________________________________________________________________________________                                          [facility] - PREPARING FOR SURGERY  Before surgery, you can play an important role.  Because skin is  not sterile, your skin needs to be as free of germs as possible.  You can reduce the number of germs on your skin by washing with CHG (chlorahexidine gluconate) soap before surgery.  CHG is an antiseptic cleaner which kills germs and bonds with the skin to continue killing germs even after washing. Please DO NOT use if you have an allergy to CHG or antibacterial soaps.  If your skin becomes reddened/irritated stop using the CHG and inform your nurse when you arrive at Short Stay. Do not shave (including legs and underarms) for at least 48 hours prior to the first CHG shower.  You may shave your face. Please follow these instructions carefully:   1.  Shower with CHG Soap the night before surgery and the  morning of Surgery.   2.  If you choose to wash your hair, wash your hair first as usual with your  normal  Shampoo.   3.  After you shampoo, rinse your hair and body thoroughly to remove the  shampoo.                                         4.  Use CHG as you would any other liquid soap.  You can apply chg directly  to the skin and wash . Gently wash with scrungie or clean wascloth    5.  Apply the CHG Soap to your body ONLY FROM THE NECK DOWN.   Do not use on open                           Wound or open sores. Avoid contact with eyes, ears mouth and genitals (private parts).                        Genitals (private parts) with your normal soap.              6.  Wash thoroughly, paying special attention to the area where your surgery  will be performed.   7.  Thoroughly rinse your body with warm water from the neck down.   8.  DO NOT shower/wash with your normal soap after using and rinsing off  the CHG Soap .                9.  Pat yourself dry with a clean  towel.             10.  Wear clean night clothes to bed after shower             11.  Place clean sheets on your bed the night of your first shower and do not  sleep with pets.  Day of Surgery : Do not apply any lotions/deodorants the morning of surgery.  Please wear clean clothes to the hospital/surgery center.  FAILURE TO FOLLOW THESE INSTRUCTIONS MAY RESULT IN THE CANCELLATION OF YOUR SURGERY    PATIENT SIGNATURE_________________________________  ______________________________________________________________________     Annette [name]  An incentive spirometer is a tool that can help keep your lungs clear and active. This tool measures how well you are filling your lungs with each breath. Taking long deep breaths may help reverse or decrease the chance of developing breathing (pulmonary) problems (especially infection) following:  A long period of time when you are unable to move or be active.  BEFORE THE PROCEDURE   If the spirometer includes an indicator to show your best effort, your nurse or respiratory therapist will set it to a desired goal.  If possible, sit up straight or lean slightly forward. Try not to slouch.  Hold the incentive spirometer in an upright position. INSTRUCTIONS FOR USE   Sit on the edge of your bed if possible, or sit up as far as you can in bed or on a chair.  Hold the incentive spirometer in an upright position.  Breathe out normally.  Place the mouthpiece in your mouth and seal your lips tightly around it.  Breathe in slowly and as deeply as possible, raising the piston or the ball toward the top of the column.  Hold your breath for 3-5 seconds or for as long as possible. Allow the piston or ball to fall to the bottom of the column.  Remove the mouthpiece from your mouth and breathe out normally.  Rest for a few seconds and repeat Steps 1 through 7 at least 10 times every 1-2 hours when you are awake. Take your time and take a few  normal breaths between deep breaths.  The spirometer may include an indicator to show your best effort. Use the indicator as a goal to work toward during each repetition.  After each set of 10 deep breaths, practice coughing to be sure your lungs are clear. If you have an incision (the cut made at the time of surgery), support your incision when coughing by placing a pillow or rolled up towels firmly against it. Once you are able to get out of bed, walk around indoors and cough well. You may stop using the incentive spirometer when instructed by your caregiver.  RISKS AND COMPLICATIONS  Take your time so you do not get dizzy or light-headed.  If you are in pain, you may need to take or ask for pain medication before doing incentive spirometry. It is harder to take a deep breath if you are having pain. AFTER USE  Rest and breathe slowly and easily.  It can be helpful to keep track of a log of your progress. Your caregiver can provide you with a simple table to help with this. If you are using the spirometer at home, follow these instructions: [place] IF:   You are having difficultly using the spirometer.  You have trouble using the spirometer as often as instructed.  Your pain medication is not giving enough relief while using the spirometer.  You develop fever of 100.5 F (38.1 C) or higher. SEEK IMMEDIATE MEDICAL CARE IF:   You cough up bloody sputum that had not been present before.  You develop fever of 102 F (38.9 C) or greater.  You develop worsening pain at or near the incision site. MAKE SURE YOU:   Understand these instructions.  Will watch your condition.  Will get help right away if you are not doing well or get worse. Document Released: 12/14/2006 Document Revised: 10/26/2011 Document Reviewed: 02/14/2007 Exit[employer] Patient Information 2014 Exit[employer], [place].   ________________________________________________________________________  WHAT IS A BLOOD  TRANSFUSION? Blood Transfusion Information  A transfusion is the replacement of blood or some of its parts. Blood is made up of multiple cells which provide different functions.  Red blood cells carry oxygen and are used for blood loss replacement.  White blood cells fight against infection.  Platelets control bleeding.  Plasma helps clot blood.  Other blood products are available for specialized needs, such  as hemophilia or other clotting disorders. BEFORE THE TRANSFUSION  Who gives blood for transfusions?   Healthy volunteers who are fully evaluated to make sure their blood is safe. This is blood bank blood. Transfusion therapy is the safest it has ever been in the practice of medicine. Before blood is taken from a donor, a complete history is taken to make sure that person has no history of diseases nor engages in risky social behavior (examples are intravenous drug use or sexual activity with multiple partners). The donor's travel history is screened to minimize risk of transmitting infections, such as malaria. The donated blood is tested for signs of infectious diseases, such as HIV and hepatitis. The blood is then tested to be sure it is compatible with you in order to minimize the chance of a transfusion reaction. If you or a relative donates blood, this is often done in anticipation of surgery and is not appropriate for emergency situations. It takes many days to process the donated blood. RISKS AND COMPLICATIONS Although transfusion therapy is very safe and saves many lives, the main dangers of transfusion include:   Getting an infectious disease.  Developing a transfusion reaction. This is an allergic reaction to something in the blood you were given. Every precaution is taken to prevent this. The decision to have a blood transfusion has been considered carefully by your caregiver before blood is given. Blood is not given unless the benefits outweigh the risks. AFTER THE  TRANSFUSION  Right after receiving a blood transfusion, you will usually feel much better and more energetic. This is especially true if your red blood cells have gotten low (anemic). The transfusion raises the level of the red blood cells which carry oxygen, and this usually causes an energy increase.  The nurse administering the transfusion will monitor you carefully for complications. HOME CARE INSTRUCTIONS  No special instructions are needed after a transfusion. You may find your energy is better. Speak with your caregiver about any limitations on activity for underlying diseases you may have. SEEK MEDICAL CARE IF:   Your condition is not improving after your transfusion.  You develop redness or irritation at the intravenous (IV) site. SEEK IMMEDIATE MEDICAL CARE IF:  Any of the following symptoms occur over the next 12 hours:  Shaking chills.  You have a temperature by mouth above 102 F (38.9 C), not controlled by medicine.  Chest, back, or muscle pain.  People around you feel you are not acting correctly or are confused.  Shortness of breath or difficulty breathing.  Dizziness and fainting.  You get a rash or develop hives.  You have a decrease in urine output.  Your urine turns a dark color or changes to pink, red, or brown. Any of the following symptoms occur over the next 10 days:  You have a temperature by mouth above 102 F (38.9 C), not controlled by medicine.  Shortness of breath.  Weakness after normal activity.  The white part of the eye turns yellow (jaundice).  You have a decrease in the amount of urine or are urinating less often.  Your urine turns a dark color or changes to pink, red, or brown. Document Released: 07/31/2000 Document Revised: 10/26/2011 Document Reviewed: 03/19/2008 [facility] Patient Information 2014 [place], [place].  _______________________________________________________________________

## 2015-03-14 NOTE — H&P (Signed)
TOTAL HIP ADMISSION H&P  Patient is admitted for left total hip arthroplasty.  Subjective:  Chief Complaint: left hip pain  HPI: Annette [name], 64 y.o. female, has a history of pain and functional disability in the left hip(s) due to arthritis and patient has failed non-surgical conservative treatments for greater than 12 weeks to include NSAID's and/or analgesics, corticosteriod injections and activity modification.  Onset of symptoms was gradual starting 3 years ago with gradually worsening course since that time.The patient noted no past surgery on the left hip(s).  Patient currently rates pain in the left hip at 8 out of 10 with activity. Patient has night pain, worsening of pain with activity and weight bearing, pain that interfers with activities of daily living and pain with passive range of motion. Patient has evidence of periarticular osteophytes and joint space narrowing by imaging studies. This condition presents safety issues increasing the risk of falls.  There is no current active infection.  Patient Active Problem List   Diagnosis Date Noted  . constipation 02/22/2014  . IBS (irritable bowel syndrome) 12/21/2013  . Internal hemorrhoids with prolapse and bleeding 12/21/2013  . ESOPHAGITIS, REFLUX 01/27/2007   Past Medical History  Diagnosis Date  . Arthritis     Right hip, end stage  . Shingles 2004  . Varicose veins   . Pneumonia   . Gallbladder disease   . Measles     as a child  . Mumps     as a child  . Hypothyroidism   . Irritable bowel syndrome   . Hemorrhoids     Past Surgical History  Procedure Laterality Date  . Esophagogastroduodenoscopy    . Abdominal hysterectomy  2000  . Rotator cuff repair      left  . Cholecystectomy  1994  . Ovarian cyst removal  [id number]  . Total hip arthroplasty  06/2010    right  . Colonoscopy    . Ercp  2004    sludge in CBD (jaundiced)      Current outpatient prescriptions:  .  aspirin EC 81 MG tablet, Take 81 mg  by mouth daily., Disp: , Rfl:  .  Coenzyme Q10 200 MG capsule, Take 200 mg by mouth daily., Disp: , Rfl:  .  hydrochlorothiazide (MICROZIDE) 12.5 MG capsule, Take 12.5 mg by mouth every morning., Disp: , Rfl:  .  levothyroxine (SYNTHROID, LEVOTHROID) 88 MCG tablet, Take 88 mcg by mouth daily before breakfast., Disp: , Rfl:  .  propranolol (INDERAL) 60 MG tablet, Take 60 mg by mouth every morning. , Disp: , Rfl:   Allergies  Allergen Reactions  . Augmentin [Amoxicillin-Pot Clavulanate] Nausea And Vomiting  . Codeine Other (See Comments)    Causes her to stay awake   . Other Hives    IV contrast     History  Substance Use Topics  . Smoking status: Former Research scientist (life sciences)  . Smokeless tobacco: Never Used  . Alcohol Use: Yes     Comment: social    Family History  Problem Relation Age of Onset  . Alzheimer's disease Father   . Stroke Mother      Review of Systems  Constitutional: Negative.   HENT: Negative.   Eyes: Negative.   Respiratory: Negative.   Cardiovascular: Negative.   Gastrointestinal: Negative.   Genitourinary: Negative.   Musculoskeletal: Positive for joint pain. Negative for myalgias, back pain, falls and neck pain.  Skin: Negative.   Neurological: Positive for tremors. Negative for dizziness, tingling, sensory change,  speech change, focal weakness, seizures and loss of consciousness.  Endo/Heme/Allergies: Negative.   Psychiatric/Behavioral: Negative.     Objective:  Physical Exam  Constitutional: She is oriented to person, place, and time. She appears well-developed. No distress.  Overweight  HENT:  Head: Normocephalic and atraumatic.  Right Ear: External ear normal.  Left Ear: External ear normal.  Nose: Nose normal.  Mouth/Throat: Oropharynx is clear and moist.  Eyes: Conjunctivae and EOM are normal.  Neck: Normal range of motion. Neck supple.  Cardiovascular: Normal rate, regular rhythm, normal heart sounds and intact distal pulses.   No murmur  heard. Respiratory: Effort normal and breath sounds normal. No respiratory distress. She has no wheezes.  GI: Soft. Bowel sounds are normal. She exhibits no distension. There is no tenderness.  Musculoskeletal:       Right hip: Normal.       Left hip: She exhibits decreased range of motion.       Right knee: Normal.       Left knee: Normal.  Her left hip shows flexion to about 95, no internal rotation, about 10 to 20 of external rotation, 20 abduction. Her right hip, flexion 120, rotation in 30, out 40, abduction 40 without discomfort.  Neurological: She is alert and oriented to person, place, and time. She has normal strength and normal reflexes. No sensory deficit.  Skin: No rash noted. She is not diaphoretic. No erythema.  Psychiatric: She has a normal mood and affect. Her behavior is normal.     Vitals  Weight: 200 lb Height: 67in Body Surface Area: 2.02 m Body Mass Index: 31.32 kg/m  Pulse: 76 (Regular)  BP: 122/84 (Sitting, Left Arm, Standard)   Imaging Review Plain radiographs demonstrate severe degenerative joint disease of the left hip(s). The bone quality appears to be good for age and reported activity level.  Assessment/Plan:  End stage primary osteoarthritis, left hip(s)  The patient history, physical examination, clinical judgement of the provider and imaging studies are consistent with end stage degenerative joint disease of the left hip(s) and total hip arthroplasty is deemed medically necessary. The treatment options including medical management, injection therapy, arthroscopy and arthroplasty were discussed at length. The risks and benefits of total hip arthroplasty were presented and reviewed. The risks due to aseptic loosening, infection, stiffness, dislocation/subluxation,  thromboembolic complications and other imponderables were discussed.  The patient acknowledged the explanation, agreed to proceed with the plan and consent was signed. Patient is being  admitted for inpatient treatment for surgery, pain control, PT, OT, prophylactic antibiotics, VTE prophylaxis, progressive ambulation and ADL's and discharge planning.The patient is planning to be discharged home with home health services    PCP: Dr. Laurann Montana TXA IV   Ardeen Jourdain, PA-C

## 2015-03-15 ENCOUNTER — Encounter (HOSPITAL_COMMUNITY)
Admission: RE | Admit: 2015-03-15 | Discharge: 2015-03-15 | Disposition: A | Discharge status: Home / Self Care | Payer: Medicare Other | Source: Ambulatory Visit | Attending: Orthopedic Surgery | Admitting: Orthopedic Surgery

## 2015-03-15 ENCOUNTER — Encounter (HOSPITAL_COMMUNITY): Payer: Self-pay

## 2015-03-15 DIAGNOSIS — M1612 Unilateral primary osteoarthritis, left hip: Secondary | ICD-10-CM | POA: Diagnosis not present

## 2015-03-15 DIAGNOSIS — Z01812 Encounter for preprocedural laboratory examination: Secondary | ICD-10-CM | POA: Insufficient documentation

## 2015-03-15 HISTORY — DX: Edema, unspecified: R60.9

## 2015-03-15 HISTORY — DX: Sleep disorder, unspecified: G47.9

## 2015-03-15 HISTORY — DX: Tremor, unspecified: R25.1

## 2015-03-15 HISTORY — DX: Essential (primary) hypertension: I10

## 2015-03-15 LAB — URINALYSIS, ROUTINE W REFLEX MICROSCOPIC
Bilirubin Urine: NEGATIVE
GLUCOSE, UA: NEGATIVE mg/dL
HGB URINE DIPSTICK: NEGATIVE
KETONES UR: NEGATIVE mg/dL
Leukocytes, UA: NEGATIVE
NITRITE: NEGATIVE
PROTEIN: NEGATIVE mg/dL
Specific Gravity, Urine: 1.014 (ref 1.005–1.030)
Urobilinogen, UA: 0.2 mg/dL (ref 0.0–1.0)
pH: 6 (ref 5.0–8.0)

## 2015-03-15 LAB — CBC
HEMATOCRIT: 42.4 % (ref 36.0–46.0)
Hemoglobin: 14.2 g/dL (ref 12.0–15.0)
MCH: 31.1 pg (ref 26.0–34.0)
MCHC: 33.5 g/dL (ref 30.0–36.0)
MCV: 92.8 fL (ref 78.0–100.0)
PLATELETS: 332 10*3/uL (ref 150–400)
RBC: 4.57 MIL/uL (ref 3.87–5.11)
RDW: 13.2 % (ref 11.5–15.5)
WBC: 7.4 10*3/uL (ref 4.0–10.5)

## 2015-03-15 LAB — PROTIME-INR
INR: 1.01 (ref 0.00–1.49)
Prothrombin Time: 13.5 seconds (ref 11.6–15.2)

## 2015-03-15 LAB — SURGICAL PCR SCREEN
MRSA, PCR: NEGATIVE
STAPHYLOCOCCUS AUREUS: NEGATIVE

## 2015-03-15 LAB — APTT: APTT: 31 s (ref 24–37)

## 2015-03-21 LAB — TYPE AND SCREEN
ABO/RH(D): B POS
Antibody Screen: NEGATIVE

## 2015-03-22 ENCOUNTER — Inpatient Hospital Stay (HOSPITAL_COMMUNITY): Payer: Medicare Other | Admitting: Anesthesiology

## 2015-03-22 ENCOUNTER — Encounter (HOSPITAL_COMMUNITY)
Admission: RE | Disposition: A | Discharge status: Home Health | Payer: Self-pay | Source: Ambulatory Visit | Attending: Orthopedic Surgery

## 2015-03-22 ENCOUNTER — Inpatient Hospital Stay (HOSPITAL_COMMUNITY): Payer: Medicare Other

## 2015-03-22 ENCOUNTER — Inpatient Hospital Stay (HOSPITAL_COMMUNITY)
Admission: RE | Admit: 2015-03-22 | Discharge: 2015-03-23 | DRG: 470 | Disposition: A | Discharge status: Home Health | Payer: Medicare Other | Source: Ambulatory Visit | Attending: Orthopedic Surgery | Admitting: Orthopedic Surgery

## 2015-03-22 ENCOUNTER — Encounter (HOSPITAL_COMMUNITY): Payer: Self-pay | Admitting: *Deleted

## 2015-03-22 DIAGNOSIS — Z01812 Encounter for preprocedural laboratory examination: Secondary | ICD-10-CM

## 2015-03-22 DIAGNOSIS — K210 Gastro-esophageal reflux disease with esophagitis: Secondary | ICD-10-CM | POA: Diagnosis present

## 2015-03-22 DIAGNOSIS — M1612 Unilateral primary osteoarthritis, left hip: Principal | ICD-10-CM | POA: Diagnosis present

## 2015-03-22 DIAGNOSIS — Z91041 Radiographic dye allergy status: Secondary | ICD-10-CM | POA: Diagnosis not present

## 2015-03-22 DIAGNOSIS — Z79899 Other long term (current) drug therapy: Secondary | ICD-10-CM

## 2015-03-22 DIAGNOSIS — Z90710 Acquired absence of both cervix and uterus: Secondary | ICD-10-CM

## 2015-03-22 DIAGNOSIS — M169 Osteoarthritis of hip, unspecified: Secondary | ICD-10-CM | POA: Diagnosis present

## 2015-03-22 DIAGNOSIS — Z87891 Personal history of nicotine dependence: Secondary | ICD-10-CM | POA: Diagnosis not present

## 2015-03-22 DIAGNOSIS — Z823 Family history of stroke: Secondary | ICD-10-CM

## 2015-03-22 DIAGNOSIS — M25552 Pain in left hip: Secondary | ICD-10-CM | POA: Diagnosis present

## 2015-03-22 DIAGNOSIS — K589 Irritable bowel syndrome without diarrhea: Secondary | ICD-10-CM | POA: Diagnosis present

## 2015-03-22 DIAGNOSIS — I10 Essential (primary) hypertension: Secondary | ICD-10-CM | POA: Diagnosis present

## 2015-03-22 DIAGNOSIS — Z820 Family history of epilepsy and other diseases of the nervous system: Secondary | ICD-10-CM | POA: Diagnosis not present

## 2015-03-22 DIAGNOSIS — E039 Hypothyroidism, unspecified: Secondary | ICD-10-CM | POA: Diagnosis present

## 2015-03-22 DIAGNOSIS — Z885 Allergy status to narcotic agent status: Secondary | ICD-10-CM

## 2015-03-22 DIAGNOSIS — Z880 Allergy status to penicillin: Secondary | ICD-10-CM

## 2015-03-22 DIAGNOSIS — Z7982 Long term (current) use of aspirin: Secondary | ICD-10-CM

## 2015-03-22 DIAGNOSIS — Z96649 Presence of unspecified artificial hip joint: Secondary | ICD-10-CM

## 2015-03-22 HISTORY — PX: TOTAL HIP ARTHROPLASTY: SHX124

## 2015-03-22 SURGERY — ARTHROPLASTY, HIP, TOTAL, ANTERIOR APPROACH
Anesthesia: Spinal | Site: Hip | Laterality: Left

## 2015-03-22 MED ORDER — ACETAMINOPHEN 10 MG/ML IV SOLN
INTRAVENOUS | Status: AC
  Filled 2015-03-22: qty 100

## 2015-03-22 MED ORDER — PROPRANOLOL HCL 60 MG PO TABS: 60.0000 mg | ORAL_TABLET | Freq: Every morning | ORAL | Status: DC

## 2015-03-22 MED ORDER — DEXAMETHASONE SODIUM PHOSPHATE 10 MG/ML IJ SOLN
10.0000 mg | Freq: Once | INTRAMUSCULAR | Status: AC
  Administered 2015-03-23: 10 mg via INTRAVENOUS
  Filled 2015-03-22: qty 1

## 2015-03-22 MED ORDER — MIDAZOLAM HCL 5 MG/5ML IJ SOLN
INTRAMUSCULAR | Status: DC | PRN
  Administered 2015-03-22: 2 mg via INTRAVENOUS

## 2015-03-22 MED ORDER — BUPIVACAINE HCL (PF) 0.5 % IJ SOLN
INTRAMUSCULAR | Status: DC | PRN
  Administered 2015-03-22: 3 mL

## 2015-03-22 MED ORDER — LACTATED RINGERS IV SOLN
INTRAVENOUS | Status: DC
  Administered 2015-03-22: 1000 mL via INTRAVENOUS
  Administered 2015-03-22 (×2): via INTRAVENOUS

## 2015-03-22 MED ORDER — DOCUSATE SODIUM 100 MG PO CAPS
100.0000 mg | ORAL_CAPSULE | Freq: Two times a day (BID) | ORAL | Status: DC
  Administered 2015-03-22 – 2015-03-23 (×2): 100 mg via ORAL

## 2015-03-22 MED ORDER — FENTANYL CITRATE (PF) 100 MCG/2ML IJ SOLN
INTRAMUSCULAR | Status: DC | PRN
  Administered 2015-03-22: 100 ug via INTRAVENOUS

## 2015-03-22 MED ORDER — ONDANSETRON HCL 4 MG PO TABS: 4.0000 mg | ORAL_TABLET | Freq: Four times a day (QID) | ORAL | Status: DC | PRN

## 2015-03-22 MED ORDER — BISACODYL 10 MG RE SUPP: 10.0000 mg | Freq: Every day | RECTAL | Status: DC | PRN

## 2015-03-22 MED ORDER — LIDOCAINE HCL (CARDIAC) 20 MG/ML IV SOLN
INTRAVENOUS | Status: AC
  Filled 2015-03-22: qty 5

## 2015-03-22 MED ORDER — HYDROCHLOROTHIAZIDE 12.5 MG PO CAPS
12.5000 mg | ORAL_CAPSULE | Freq: Every morning | ORAL | Status: DC
  Filled 2015-03-22: qty 1

## 2015-03-22 MED ORDER — MENTHOL 3 MG MT LOZG: 1.0000 | LOZENGE | OROMUCOSAL | Status: DC | PRN

## 2015-03-22 MED ORDER — ACETAMINOPHEN 325 MG PO TABS: 650.0000 mg | ORAL_TABLET | Freq: Four times a day (QID) | ORAL | Status: DC | PRN

## 2015-03-22 MED ORDER — ONDANSETRON HCL 4 MG/2ML IJ SOLN
INTRAMUSCULAR | Status: DC | PRN
  Administered 2015-03-22: 4 mg via INTRAVENOUS

## 2015-03-22 MED ORDER — PHENYLEPHRINE 40 MCG/ML (10ML) SYRINGE FOR IV PUSH (FOR BLOOD PRESSURE SUPPORT)
PREFILLED_SYRINGE | INTRAVENOUS | Status: AC
  Filled 2015-03-22: qty 10

## 2015-03-22 MED ORDER — EPHEDRINE SULFATE 50 MG/ML IJ SOLN
INTRAMUSCULAR | Status: AC
  Filled 2015-03-22: qty 1

## 2015-03-22 MED ORDER — PROMETHAZINE HCL 25 MG/ML IJ SOLN: 6.2500 mg | INTRAMUSCULAR | Status: DC | PRN

## 2015-03-22 MED ORDER — HYDROMORPHONE HCL 2 MG PO TABS
2.0000 mg | ORAL_TABLET | ORAL | Status: DC | PRN
Start: 2015-03-22 — End: 2015-03-23

## 2015-03-22 MED ORDER — DEXTROSE 5 % IV SOLN
500.0000 mg | Freq: Four times a day (QID) | INTRAVENOUS | Status: DC | PRN
  Administered 2015-03-22: 500 mg via INTRAVENOUS
  Filled 2015-03-22 (×2): qty 5

## 2015-03-22 MED ORDER — SODIUM CHLORIDE 0.9 % IV SOLN
INTRAVENOUS | Status: DC
  Administered 2015-03-22: 18:00:00 via INTRAVENOUS

## 2015-03-22 MED ORDER — EPHEDRINE SULFATE 50 MG/ML IJ SOLN
INTRAMUSCULAR | Status: DC | PRN
  Administered 2015-03-22 (×3): 5 mg via INTRAVENOUS
  Administered 2015-03-22: 10 mg via INTRAVENOUS

## 2015-03-22 MED ORDER — HYDROMORPHONE HCL 2 MG PO TABS: 2.0000 mg | ORAL_TABLET | ORAL | Status: DC | PRN

## 2015-03-22 MED ORDER — FENTANYL CITRATE (PF) 100 MCG/2ML IJ SOLN: 25.0000 ug | INTRAMUSCULAR | Status: DC | PRN

## 2015-03-22 MED ORDER — PHENYLEPHRINE HCL 10 MG/ML IJ SOLN
INTRAMUSCULAR | Status: DC | PRN
  Administered 2015-03-22: 40 ug via INTRAVENOUS
  Administered 2015-03-22: 80 ug via INTRAVENOUS
  Administered 2015-03-22 (×3): 40 ug via INTRAVENOUS

## 2015-03-22 MED ORDER — PROPOFOL 10 MG/ML IV BOLUS
INTRAVENOUS | Status: AC
  Filled 2015-03-22: qty 20

## 2015-03-22 MED ORDER — CEFAZOLIN SODIUM-DEXTROSE 2-3 GM-% IV SOLR
2.0000 g | INTRAVENOUS | Status: AC
  Administered 2015-03-22: 2 g via INTRAVENOUS

## 2015-03-22 MED ORDER — BUPIVACAINE HCL (PF) 0.25 % IJ SOLN
INTRAMUSCULAR | Status: DC | PRN
  Administered 2015-03-22: 30 mL

## 2015-03-22 MED ORDER — MEPERIDINE HCL 50 MG/ML IJ SOLN: 6.2500 mg | INTRAMUSCULAR | Status: DC | PRN

## 2015-03-22 MED ORDER — BUPIVACAINE HCL (PF) 0.5 % IJ SOLN
INTRAMUSCULAR | Status: AC
  Filled 2015-03-22: qty 30

## 2015-03-22 MED ORDER — METOCLOPRAMIDE HCL 5 MG/ML IJ SOLN: 5.0000 mg | Freq: Three times a day (TID) | INTRAMUSCULAR | Status: DC | PRN

## 2015-03-22 MED ORDER — METOCLOPRAMIDE HCL 5 MG PO TABS
5.0000 mg | ORAL_TABLET | Freq: Three times a day (TID) | ORAL | Status: DC | PRN
  Filled 2015-03-22: qty 2

## 2015-03-22 MED ORDER — SODIUM CHLORIDE 0.9 % IV SOLN
1000.0000 mg | INTRAVENOUS | Status: AC
  Administered 2015-03-22: 1000 mg via INTRAVENOUS
  Filled 2015-03-22: qty 10

## 2015-03-22 MED ORDER — DEXAMETHASONE SODIUM PHOSPHATE 10 MG/ML IJ SOLN
INTRAMUSCULAR | Status: AC
  Filled 2015-03-22: qty 1

## 2015-03-22 MED ORDER — LACTATED RINGERS IV SOLN: INTRAVENOUS | Status: DC

## 2015-03-22 MED ORDER — ACETAMINOPHEN 500 MG PO TABS
1000.0000 mg | ORAL_TABLET | Freq: Four times a day (QID) | ORAL | Status: DC
  Administered 2015-03-22 – 2015-03-23 (×3): 1000 mg via ORAL
  Filled 2015-03-22 (×4): qty 2

## 2015-03-22 MED ORDER — CHLORHEXIDINE GLUCONATE 4 % EX LIQD: 60.0000 mL | Freq: Once | CUTANEOUS | Status: DC

## 2015-03-22 MED ORDER — LIDOCAINE HCL (CARDIAC) 20 MG/ML IV SOLN
INTRAVENOUS | Status: DC | PRN
  Administered 2015-03-22: 50 mg via INTRAVENOUS

## 2015-03-22 MED ORDER — ACETAMINOPHEN 650 MG RE SUPP: 650.0000 mg | Freq: Four times a day (QID) | RECTAL | Status: DC | PRN

## 2015-03-22 MED ORDER — CEFAZOLIN SODIUM-DEXTROSE 2-3 GM-% IV SOLR
2.0000 g | Freq: Four times a day (QID) | INTRAVENOUS | Status: AC
  Administered 2015-03-22 – 2015-03-23 (×2): 2 g via INTRAVENOUS
  Filled 2015-03-22 (×2): qty 50

## 2015-03-22 MED ORDER — PROPRANOLOL HCL ER 60 MG PO CP24
60.0000 mg | ORAL_CAPSULE | Freq: Every day | ORAL | Status: DC
  Filled 2015-03-22: qty 1

## 2015-03-22 MED ORDER — METHOCARBAMOL 500 MG PO TABS: 500.0000 mg | ORAL_TABLET | Freq: Four times a day (QID) | ORAL | Status: DC | PRN

## 2015-03-22 MED ORDER — FENTANYL CITRATE (PF) 100 MCG/2ML IJ SOLN
INTRAMUSCULAR | Status: AC
  Filled 2015-03-22: qty 4

## 2015-03-22 MED ORDER — CEFAZOLIN SODIUM-DEXTROSE 2-3 GM-% IV SOLR
INTRAVENOUS | Status: AC
  Filled 2015-03-22: qty 50

## 2015-03-22 MED ORDER — RIVAROXABAN 10 MG PO TABS: 10.0000 mg | ORAL_TABLET | Freq: Every day | ORAL | Status: DC

## 2015-03-22 MED ORDER — SODIUM CHLORIDE 0.9 % IV SOLN: INTRAVENOUS | Status: DC

## 2015-03-22 MED ORDER — 0.9 % SODIUM CHLORIDE (POUR BTL) OPTIME
TOPICAL | Status: DC | PRN
  Administered 2015-03-22: 1000 mL

## 2015-03-22 MED ORDER — RIVAROXABAN 10 MG PO TABS
10.0000 mg | ORAL_TABLET | Freq: Every day | ORAL | Status: DC
  Administered 2015-03-23: 10 mg via ORAL
  Filled 2015-03-22 (×2): qty 1

## 2015-03-22 MED ORDER — MIDAZOLAM HCL 2 MG/2ML IJ SOLN
INTRAMUSCULAR | Status: AC
  Filled 2015-03-22: qty 4

## 2015-03-22 MED ORDER — METHOCARBAMOL 500 MG PO TABS
500.0000 mg | ORAL_TABLET | Freq: Four times a day (QID) | ORAL | Status: DC | PRN
  Administered 2015-03-23: 500 mg via ORAL
  Filled 2015-03-22: qty 1

## 2015-03-22 MED ORDER — TRAMADOL HCL 50 MG PO TABS
50.0000 mg | ORAL_TABLET | Freq: Four times a day (QID) | ORAL | Status: DC | PRN
  Administered 2015-03-22 – 2015-03-23 (×3): 50 mg via ORAL
  Filled 2015-03-22 (×3): qty 1

## 2015-03-22 MED ORDER — FLEET ENEMA 7-19 GM/118ML RE ENEM: 1.0000 | ENEMA | Freq: Once | RECTAL | Status: AC | PRN

## 2015-03-22 MED ORDER — BUPIVACAINE HCL (PF) 0.25 % IJ SOLN
INTRAMUSCULAR | Status: AC
  Filled 2015-03-22: qty 30

## 2015-03-22 MED ORDER — PHENOL 1.4 % MT LIQD
1.0000 | OROMUCOSAL | Status: DC | PRN
  Filled 2015-03-22: qty 177

## 2015-03-22 MED ORDER — POLYETHYLENE GLYCOL 3350 17 G PO PACK: 17.0000 g | PACK | Freq: Every day | ORAL | Status: DC | PRN

## 2015-03-22 MED ORDER — DIPHENHYDRAMINE HCL 12.5 MG/5ML PO ELIX: 12.5000 mg | ORAL_SOLUTION | ORAL | Status: DC | PRN

## 2015-03-22 MED ORDER — PROPOFOL INFUSION 10 MG/ML OPTIME
INTRAVENOUS | Status: DC | PRN
  Administered 2015-03-22: 50 ug/kg/min via INTRAVENOUS

## 2015-03-22 MED ORDER — TRAMADOL HCL 50 MG PO TABS: 50.0000 mg | ORAL_TABLET | Freq: Four times a day (QID) | ORAL | Status: DC | PRN

## 2015-03-22 MED ORDER — DEXAMETHASONE SODIUM PHOSPHATE 10 MG/ML IJ SOLN
10.0000 mg | Freq: Once | INTRAMUSCULAR | Status: AC
  Administered 2015-03-22: 10 mg via INTRAVENOUS

## 2015-03-22 MED ORDER — HYDROMORPHONE HCL 1 MG/ML IJ SOLN
0.5000 mg | INTRAMUSCULAR | Status: DC | PRN
  Administered 2015-03-22 (×2): 0.5 mg via INTRAVENOUS
  Administered 2015-03-23 (×2): 1 mg via INTRAVENOUS
  Filled 2015-03-22 (×4): qty 1

## 2015-03-22 MED ORDER — ONDANSETRON HCL 4 MG/2ML IJ SOLN
INTRAMUSCULAR | Status: AC
  Filled 2015-03-22: qty 2

## 2015-03-22 MED ORDER — KETOROLAC TROMETHAMINE 15 MG/ML IJ SOLN: 7.5000 mg | Freq: Four times a day (QID) | INTRAMUSCULAR | Status: AC | PRN

## 2015-03-22 MED ORDER — ACETAMINOPHEN 10 MG/ML IV SOLN
1000.0000 mg | Freq: Once | INTRAVENOUS | Status: AC
  Administered 2015-03-22: 1000 mg via INTRAVENOUS
  Filled 2015-03-22: qty 100

## 2015-03-22 MED ORDER — ONDANSETRON HCL 4 MG/2ML IJ SOLN: 4.0000 mg | Freq: Four times a day (QID) | INTRAMUSCULAR | Status: DC | PRN

## 2015-03-22 MED ORDER — LEVOTHYROXINE SODIUM 88 MCG PO TABS
88.0000 ug | ORAL_TABLET | Freq: Every day | ORAL | Status: DC
  Administered 2015-03-23: 88 ug via ORAL
  Filled 2015-03-22 (×2): qty 1

## 2015-03-22 SURGICAL SUPPLY — 43 items
BAG DECANTER FOR FLEXI CONT (MISCELLANEOUS) ×3 IMPLANT
BAG SPEC THK2 15X12 ZIP CLS (MISCELLANEOUS)
BAG ZIPLOCK 12X15 (MISCELLANEOUS) IMPLANT
BLADE EXTENDED COATED 6.5IN (ELECTRODE) ×3 IMPLANT
BLADE SAG 18X100X1.27 (BLADE) ×3 IMPLANT
CAPT HIP TOTAL 2 ×2 IMPLANT
CLOSURE WOUND 1/2 X4 (GAUZE/BANDAGES/DRESSINGS) ×1
COVER PERINEAL POST (MISCELLANEOUS) ×3 IMPLANT
DECANTER SPIKE VIAL GLASS SM (MISCELLANEOUS) ×3 IMPLANT
DRAPE C-ARM 42X120 X-RAY (DRAPES) ×3 IMPLANT
DRAPE STERI IOBAN 125X83 (DRAPES) ×3 IMPLANT
DRAPE U-SHAPE 47X51 STRL (DRAPES) ×9 IMPLANT
DRSG ADAPTIC 3X8 NADH LF (GAUZE/BANDAGES/DRESSINGS) ×3 IMPLANT
DRSG MEPILEX BORDER 4X4 (GAUZE/BANDAGES/DRESSINGS) ×3 IMPLANT
DRSG MEPILEX BORDER 4X8 (GAUZE/BANDAGES/DRESSINGS) ×3 IMPLANT
DURAPREP 26ML APPLICATOR (WOUND CARE) ×3 IMPLANT
ELECT REM PT RETURN 9FT ADLT (ELECTROSURGICAL) ×3
ELECTRODE REM PT RTRN 9FT ADLT (ELECTROSURGICAL) ×1 IMPLANT
EVACUATOR 1/8 PVC DRAIN (DRAIN) ×3 IMPLANT
FACESHIELD WRAPAROUND (MASK) ×12 IMPLANT
FACESHIELD WRAPAROUND OR TEAM (MASK) ×4 IMPLANT
GLOVE BIO SURGEON STRL SZ7.5 (GLOVE) ×3 IMPLANT
GLOVE BIO SURGEON STRL SZ8 (GLOVE) ×6 IMPLANT
GLOVE BIOGEL PI IND STRL 8 (GLOVE) ×2 IMPLANT
GLOVE BIOGEL PI INDICATOR 8 (GLOVE) ×4
GOWN STRL REUS W/TWL LRG LVL3 (GOWN DISPOSABLE) ×3 IMPLANT
GOWN STRL REUS W/TWL XL LVL3 (GOWN DISPOSABLE) ×3 IMPLANT
KIT BASIN OR (CUSTOM PROCEDURE TRAY) ×3 IMPLANT
NDL SAFETY ECLIPSE 18X1.5 (NEEDLE) ×1 IMPLANT
NEEDLE HYPO 18GX1.5 SHARP (NEEDLE) ×3
PACK TOTAL JOINT (CUSTOM PROCEDURE TRAY) ×3 IMPLANT
PEN SKIN MARKING BROAD (MISCELLANEOUS) ×3 IMPLANT
STRIP CLOSURE SKIN 1/2X4 (GAUZE/BANDAGES/DRESSINGS) ×2 IMPLANT
SUT ETHIBOND NAB CT1 #1 30IN (SUTURE) ×3 IMPLANT
SUT MNCRL AB 4-0 PS2 18 (SUTURE) ×3 IMPLANT
SUT VIC AB 2-0 CT1 27 (SUTURE) ×6
SUT VIC AB 2-0 CT1 TAPERPNT 27 (SUTURE) ×2 IMPLANT
SUT VLOC 180 0 24IN GS25 (SUTURE) ×3 IMPLANT
SYR 30ML LL (SYRINGE) ×3 IMPLANT
SYR 50ML LL SCALE MARK (SYRINGE) IMPLANT
TOWEL OR 17X26 10 PK STRL BLUE (TOWEL DISPOSABLE) ×3 IMPLANT
TOWEL OR NON WOVEN STRL DISP B (DISPOSABLE) ×2 IMPLANT
TRAY FOLEY W/METER SILVER 14FR (SET/KITS/TRAYS/PACK) ×3 IMPLANT

## 2015-03-22 NOTE — Anesthesia Preprocedure Evaluation (Signed)
Anesthesia Evaluation  Patient identified by MRN, date of birth, ID band Patient awake    Reviewed: Allergy & Precautions, NPO status , Patient's Chart, lab work & pertinent test results  Airway Mallampati: II  TM Distance: >3 FB Neck ROM: Full    Dental no notable dental hx. (+) Caps   Pulmonary neg pulmonary ROS, former smoker,  breath sounds clear to auscultation  Pulmonary exam normal       Cardiovascular hypertension, Pt. on medications Normal cardiovascular examRhythm:Regular Rate:Normal     Neuro/Psych negative neurological ROS  negative psych ROS   GI/Hepatic negative GI ROS, Neg liver ROS,   Endo/Other  negative endocrine ROS  Renal/GU negative Renal ROS  negative genitourinary   Musculoskeletal negative musculoskeletal ROS (+)   Abdominal   Peds negative pediatric ROS (+)  Hematology negative hematology ROS (+)   Anesthesia Other Findings   Reproductive/Obstetrics negative OB ROS                             Anesthesia Physical Anesthesia Plan  ASA: II  Anesthesia Plan: Spinal   Post-op Pain Management:    Induction: Intravenous  Airway Management Planned: Simple Face Mask  Additional Equipment:   Intra-op Plan:   Post-operative Plan:   Informed Consent: I have reviewed the patients History and Physical, chart, labs and discussed the procedure including the risks, benefits and alternatives for the proposed anesthesia with the patient or authorized representative who has indicated his/her understanding and acceptance.   Dental advisory given  Plan Discussed with: CRNA  Anesthesia Plan Comments:         Anesthesia Quick Evaluation

## 2015-03-22 NOTE — Anesthesia Postprocedure Evaluation (Signed)
  Anesthesia Post-op Note  Patient: Annette [name]  Procedure(s) Performed: Procedure(s) (LRB): LEFT TOTAL HIP ARTHROPLASTY ANTERIOR APPROACH (Left)  Patient Location: PACU  Anesthesia Type: Spinal  Level of Consciousness: awake and alert   Airway and Oxygen Therapy: Patient Spontanous Breathing  Post-op Pain: mild  Post-op Assessment: Post-op Vital signs reviewed, Patient's Cardiovascular Status Stable, Respiratory Function Stable, Patent Airway and No signs of Nausea or vomiting  Last Vitals:  Filed Vitals:   03/22/15 1724  BP: 101/54  Pulse: 42  Temp: 36.7 C  Resp: 14    Post-op Vital Signs: stable   Complications: No apparent anesthesia complications

## 2015-03-22 NOTE — Anesthesia Procedure Notes (Signed)
Spinal Patient location during procedure: OR End time: 03/22/2015 2:10 PM Staffing Resident/CRNA: ,  D Performed by: anesthesiologist and resident/CRNA  Preanesthetic Checklist Completed: patient identified, site marked, surgical consent, pre-op evaluation, timeout performed, IV checked, risks and benefits discussed and monitors and equipment checked Spinal Block Patient position: sitting Prep: Betadine Patient monitoring: heart rate, continuous pulse ox and blood pressure Approach: midline Location: L2-3 Injection technique: single-shot Needle Needle type: Sprotte  Needle gauge: 24 G Needle length: 9 cm Assessment Sensory level: T6 Additional Notes Expiration date of kit checked and confirmed. Patient tolerated procedure well, without complications.     

## 2015-03-22 NOTE — Interval H&P Note (Signed)
History and Physical Interval Note:  03/22/2015 1:49 PM  Annette [name]  has presented today for surgery, with the diagnosis of OA LEFT HIP  The various methods of treatment have been discussed with the patient and family. After consideration of risks, benefits and other options for treatment, the patient has consented to  Procedure(s): LEFT TOTAL HIP ARTHROPLASTY ANTERIOR APPROACH (Left) as a surgical intervention .  The patient's history has been reviewed, patient examined, no change in status, stable for surgery.  I have reviewed the patient's chart and labs.  Questions were answered to the patient's satisfaction.     Gearlean Alf

## 2015-03-22 NOTE — Op Note (Signed)
OPERATIVE REPORT  PREOPERATIVE DIAGNOSIS: Osteoarthritis of the Left hip.   POSTOPERATIVE DIAGNOSIS: Osteoarthritis of the Left  hip.   PROCEDURE: Left total hip arthroplasty, anterior approach.   SURGEON: Gaynelle Arabian, MD   ASSISTANT: Arlee Muslim, PA-C  ANESTHESIA:  Spinal  ESTIMATED BLOOD LOSS:-300 ml    DRAINS: Hemovac x1.   COMPLICATIONS: None   CONDITION: PACU - hemodynamically stable.   BRIEF CLINICAL NOTE: Annette [name] is a 64 y.o. female who has advanced end-  stage arthritis of her Left  hip with progressively worsening pain and  dysfunction.The patient has failed nonoperative management and presents for  total hip arthroplasty.   PROCEDURE IN DETAIL: After successful administration of spinal  anesthetic, the traction boots for the [facility] bed were placed on both  feet and the patient was placed onto the [facility] bed, boots placed into the leg  holders. The Left hip was then isolated from the perineum with plastic  drapes and prepped and draped in the usual sterile fashion. ASIS and  greater trochanter were marked and a oblique incision was made, starting  at about 1 cm lateral and 2 cm distal to the ASIS and coursing towards  the anterior cortex of the femur. The skin was cut with a 10 blade  through subcutaneous tissue to the level of the fascia overlying the  tensor fascia lata muscle. The fascia was then incised in line with the  incision at the junction of the anterior third and posterior 2/3rd. The  muscle was teased off the fascia and then the interval between the TFL  and the rectus was developed. The Hohmann retractor was then placed at  the top of the femoral neck over the capsule. The vessels overlying the  capsule were cauterized and the fat on top of the capsule was removed.  A Hohmann retractor was then placed anterior underneath the rectus  femoris to give exposure to the entire anterior capsule. A T-shaped  capsulotomy was performed. The  edges were tagged and the femoral head  was identified.       Osteophytes are removed off the superior acetabulum.  The femoral neck was then cut in situ with an oscillating saw. Traction  was then applied to the left lower extremity utilizing the [facility]  traction. The femoral head was then removed. Retractors were placed  around the acetabulum and then circumferential removal of the labrum was  performed. Osteophytes were also removed. Reaming starts at 47 mm to  medialize and  Increased in 2 mm increments to 49 mm. We reamed in  approximately 40 degrees of abduction, 20 degrees anteversion. A 50 mm  pinnacle acetabular shell was then impacted in anatomic position under  fluoroscopic guidance with excellent purchase. We did not need to place  any additional dome screws. A 32 mm neutral + 4 marathon liner was then  placed into the acetabular shell.       The femoral lift was then placed along the lateral aspect of the femur  just distal to the vastus ridge. The leg was  externally rotated and capsule  was stripped off the inferior aspect of the femoral neck down to the  level of the lesser trochanter, this was done with electrocautery. The femur was lifted after this was performed. The  leg was then placed and extended in adducted position to essentially delivering the femur. We also removed the capsule superiorly and the  piriformis from the  piriformis fossa to gain excellent exposure of the  proximal femur. Rongeur was used to remove some cancellous bone to get  into the lateral portion of the proximal femur for placement of the  initial starter reamer. The starter broaches was placed  the starter broach  and was shown to go down the center of the canal. Broaching  with the  Corail system was then performed starting at size 8, coursing  Up to size 13. A size 13 had excellent torsional and rotational  and axial stability. The trial high offset neck was then placed  with a 32 + 5 trial head.  The hip was then reduced. We confirmed that  the stem was in the canal both on AP and lateral x-rays. It also has excellent sizing. The hip was reduced with outstanding stability through full extension, full external rotation,  and then flexion in adduction internal rotation. AP pelvis was taken  and the leg lengths were measured and found to be exactly equal. Hip  was then dislocated again and the femoral head and neck removed. The  femoral broach was removed. Size 13 Corail stem with a high offset  neck was then impacted into the femur following native anteversion. Has  excellent purchase in the canal. Excellent torsional and rotational and  axial stability. It is confirmed to be in the canal on AP and lateral  fluoroscopic views. The 32 + 5 ceramic head was placed and the hip  reduced with outstanding stability. Again AP pelvis was taken and it  confirmed that the leg lengths were equal. The wound was then copiously  irrigated with saline solution and the capsule reattached and repaired  with Ethibond suture.  30 ml of .25% Bupivicaine injected into the capsule and into the edge of the tensor fascia lata as well as subcutaneous tissue. The fascia overlying the tensor fascia lata was  then closed with a running #1 V-Loc. Subcu was closed with interrupted  2-0 Vicryl and subcuticular running 4-0 Monocryl. Incision was cleaned  and dried. Steri-Strips and a bulky sterile dressing applied. Hemovac  drain was hooked to suction and then she was awakened and transported to  recovery in stable condition.        Please note that a surgical assistant was a medical necessity for this procedure to perform it in a safe and expeditious manner. Assistant was necessary to provide appropriate retraction of vital neurovascular structures and to prevent femoral fracture and allow for anatomic placement of the prosthesis.  Gaynelle Arabian, M.D.

## 2015-03-22 NOTE — Discharge Instructions (Signed)
Dr. Gaynelle Arabian Total Joint Specialist [facility] [address] [place], [place] [address] [phone]  ANTERIOR APPROACH TOTAL HIP REPLACEMENT POSTOPERATIVE DIRECTIONS   Hip Rehabilitation, Guidelines Following Surgery  The results of a hip operation are greatly improved after range of motion and muscle strengthening exercises. Follow all safety measures which are given to protect your hip. If any of these exercises cause increased pain or swelling in your joint, decrease the amount until you are comfortable again. Then slowly increase the exercises. Call your caregiver if you have problems or questions.   HOME CARE INSTRUCTIONS  Remove items at home which could result in a fall. This includes throw rugs or furniture in walking pathways.   ICE to the affected hip every three hours for 30 minutes at a time and then as needed for pain and swelling.  Continue to use ice on the hip for pain and swelling from surgery. You may notice swelling that will progress down to the foot and ankle.  This is normal after surgery.  Elevate the leg when you are not up walking on it.    Continue to use the breathing machine which will help keep your temperature down.  It is common for your temperature to cycle up and down following surgery, especially at night when you are not up moving around and exerting yourself.  The breathing machine keeps your lungs expanded and your temperature down.   DIET You may resume your previous home diet once your are discharged from the hospital.  DRESSING / WOUND CARE / SHOWERING You may shower 3 days after surgery, but keep the wounds dry during showering.  You may use an occlusive plastic wrap (Press'n Seal for example), NO SOAKING/SUBMERGING IN THE BATHTUB.  If the bandage gets wet, change with a clean dry gauze.  If the incision gets wet, pat the wound dry with a clean towel. You may start showering once you are discharged home but do not  submerge the incision under water. Just pat the incision dry and apply a dry gauze dressing on daily. Change the surgical dressing daily and reapply a dry dressing each time.  ACTIVITY Walk with your walker as instructed. Use walker as long as suggested by your caregivers. Avoid periods of inactivity such as sitting longer than an hour when not asleep. This helps prevent blood clots.  You may resume a sexual relationship in one month or when given the OK by your doctor.  You may return to work once you are cleared by your doctor.  Do not drive a car for 6 weeks or until released by you surgeon.  Do not drive while taking narcotics.  WEIGHT BEARING Other:  Weight bearing as tolerated  POSTOPERATIVE CONSTIPATION PROTOCOL Constipation - defined medically as fewer than three stools per week and severe constipation as less than one stool per week.  One of the most common issues patients have following surgery is constipation.  Even if you have a regular bowel pattern at home, your normal regimen is likely to be disrupted due to multiple reasons following surgery.  Combination of anesthesia, postoperative narcotics, change in appetite and fluid intake all can affect your bowels.  In order to avoid complications following surgery, here are some recommendations in order to help you during your recovery period.  Colace (docusate) - Pick up an over-the-counter form of Colace or another stool softener and take twice a day as long as you are requiring postoperative pain medications.  Take with a full glass of water daily.  If you experience loose stools or diarrhea, hold the colace until you stool forms back up.  If your symptoms do not get better within 1 week or if they get worse, check with your doctor.  Dulcolax (bisacodyl) - Pick up over-the-counter and take as directed by the product packaging as needed to assist with the movement of your bowels.  Take with a full glass of water.  Use this product as  needed if not relieved by Colace only.   MiraLax (polyethylene glycol) - Pick up over-the-counter to have on hand.  MiraLax is a solution that will increase the amount of water in your bowels to assist with bowel movements.  Take as directed and can mix with a glass of water, juice, soda, coffee, or tea.  Take if you go more than two days without a movement. Do not use MiraLax more than once per day. Call your doctor if you are still constipated or irregular after using this medication for 7 days in a row.  If you continue to have problems with postoperative constipation, please contact the office for further assistance and recommendations.  If you experience "the worst abdominal pain ever" or develop nausea or vomiting, please contact the office immediatly for further recommendations for treatment.  ITCHING  If you experience itching with your medications, try taking only a single pain pill, or even half a pain pill at a time.  You can also use Benadryl over the counter for itching or also to help with sleep.   TED HOSE STOCKINGS Wear the elastic stockings on both legs for three weeks following surgery during the day but you may remove then at night for sleeping.  MEDICATIONS See your medication summary on the After Visit Summary that the nursing staff will review with you prior to discharge.  You may have some home medications which will be placed on hold until you complete the course of blood thinner medication.  It is important for you to complete the blood thinner medication as prescribed by your surgeon.  Continue your approved medications as instructed at time of discharge.  PRECAUTIONS If you experience chest pain or shortness of breath - call 911 immediately for transfer to the hospital emergency department.  If you develop a fever greater that 101 F, purulent drainage from wound, increased redness or drainage from wound, foul odor from the wound/dressing, or calf pain - CONTACT YOUR  SURGEON.                                                   FOLLOW-UP APPOINTMENTS Make sure you keep all of your appointments after your operation with your surgeon and caregivers. You should call the office at the above phone number and make an appointment for approximately two weeks after the date of your surgery or on the date instructed by your surgeon outlined in the "After Visit Summary".  RANGE OF MOTION AND STRENGTHENING EXERCISES  These exercises are designed to help you keep full movement of your hip joint. Follow your caregiver's or physical therapist's instructions. Perform all exercises about fifteen times, three times per day or as directed. Exercise both hips, even if you have had only one joint replacement. These exercises can be done on a training (exercise) mat, on the floor, on a table  or on a bed. Use whatever works the best and is most comfortable for you. Use music or television while you are exercising so that the exercises are a pleasant break in your day. This will make your life better with the exercises acting as a break in routine you can look forward to.  Lying on your back, slowly slide your foot toward your buttocks, raising your knee up off the floor. Then slowly slide your foot back down until your leg is straight again.  Lying on your back spread your legs as far apart as you can without causing discomfort.  Lying on your side, raise your upper leg and foot straight up from the floor as far as is comfortable. Slowly lower the leg and repeat.  Lying on your back, tighten up the muscle in the front of your thigh (quadriceps muscles). You can do this by keeping your leg straight and trying to raise your heel off the floor. This helps strengthen the largest muscle supporting your knee.  Lying on your back, tighten up the muscles of your buttocks both with the legs straight and with the knee bent at a comfortable angle while keeping your heel on the floor.   IF YOU ARE  TRANSFERRED TO A SKILLED REHAB FACILITY If the patient is transferred to a skilled rehab facility following release from the hospital, a list of the current medications will be sent to the facility for the patient to continue.  When discharged from the skilled rehab facility, please have the facility set up the patient's [place] prior to being released. Also, the skilled facility will be responsible for providing the patient with their medications at time of release from the facility to include their pain medication, the muscle relaxants, and their blood thinner medication. If the patient is still at the rehab facility at time of the two week follow up appointment, the skilled rehab facility will also need to assist the patient in arranging follow up appointment in our office and any transportation needs.  MAKE SURE YOU:  Understand these instructions.  Get help right away if you are not doing well or get worse.    Pick up stool softner and laxative for home use following surgery while on pain medications. Do not submerge incision under water. Please use good hand washing techniques while changing dressing each day. May shower starting three days after surgery. Please use a clean towel to pat the incision dry following showers. Continue to use ice for pain and swelling after surgery. Do not use any lotions or creams on the incision until instructed by your surgeon.

## 2015-03-22 NOTE — Transfer of Care (Signed)
Immediate Anesthesia Transfer of Care Note  Patient: Annette [name]  Procedure(s) Performed: Procedure(s): LEFT TOTAL HIP ARTHROPLASTY ANTERIOR APPROACH (Left)  Patient Location: PACU  Anesthesia Type:Regional  Level of Consciousness: awake, alert  and oriented  Airway & Oxygen Therapy: Patient Spontanous Breathing and Patient connected to face mask oxygen  Post-op Assessment: Report given to RN and Post -op Vital signs reviewed and stable  Post vital signs: Reviewed and stable  Last Vitals:  Filed Vitals:   03/22/15 1145  BP: 127/68  Pulse: 64  Temp: 36.4 C  Resp: 16    Complications: No apparent anesthesia complications

## 2015-03-23 LAB — BASIC METABOLIC PANEL
ANION GAP: 7 (ref 5–15)
BUN: 11 mg/dL (ref 6–20)
CALCIUM: 8.8 mg/dL — AB (ref 8.9–10.3)
CO2: 27 mmol/L (ref 22–32)
CREATININE: 0.71 mg/dL (ref 0.44–1.00)
Chloride: 102 mmol/L (ref 101–111)
GFR calc non Af Amer: >60 mL/min (ref >60)
Glucose, Bld: 155 mg/dL — ABNORMAL HIGH (ref 65–99)
POTASSIUM: 4.4 mmol/L (ref 3.5–5.1)
Sodium: 136 mmol/L (ref 135–145)

## 2015-03-23 LAB — CBC
HCT: 33.1 % — ABNORMAL LOW (ref 36.0–46.0)
Hemoglobin: 11.2 g/dL — ABNORMAL LOW (ref 12.0–15.0)
MCH: 30.4 pg (ref 26.0–34.0)
MCHC: 33.8 g/dL (ref 30.0–36.0)
MCV: 89.7 fL (ref 78.0–100.0)
Platelets: 228 10*3/uL (ref 150–400)
RBC: 3.69 MIL/uL — AB (ref 3.87–5.11)
RDW: 12.7 % (ref 11.5–15.5)
WBC: 11.6 10*3/uL — AB (ref 4.0–10.5)

## 2015-03-23 NOTE — Progress Notes (Signed)
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  Spouse present and reviewed stairs and car transfers.  Follow Up Recommendations  Home health PT     Equipment Recommendations  None recommended by PT    Recommendations for Other Services OT consult     Precautions / Restrictions Precautions Precautions: Fall Restrictions Weight Bearing Restrictions: No Other Position/Activity Restrictions: WBAT    Mobility  Bed Mobility Overal bed mobility: Needs Assistance Bed Mobility: Supine to Sit;Sit to Supine     Supine to sit: Min assist Sit to supine: Min assist   General bed mobility comments: assist for LLE  Transfers Overall transfer level: Needs assistance Equipment used: Rolling walker (2 wheeled) Transfers: Sit to/from Stand Sit to Stand: Supervision         General transfer comment: cues for LE management and use of UEs to self assist  Ambulation/Gait Ambulation/Gait assistance: Min guard;Supervision Ambulation Distance (Feet): 150 Feet Assistive device: Rolling walker (2 wheeled) Gait Pattern/deviations: Step-to pattern;Step-through pattern;Decreased step length - right;Decreased step length - left;Shuffle;Trunk flexed     General Gait Details: cues for seqence, posture and position from RW   Stairs Stairs: Yes Stairs assistance: Min assist Stair Management: Two rails;Step to pattern;Forwards Number of Stairs: 5 General stair comments: cues for sequence and foot placement  Wheelchair Mobility    Modified Rankin (Stroke Patients Only)       Balance                                    Cognition Arousal/Alertness: Awake/alert Behavior During Therapy: WFL for tasks assessed/performed Overall Cognitive Status: Within Functional Limits for tasks  assessed                      Exercises      General Comments        Pertinent Vitals/Pain Pain Assessment: 0-10 Pain Score: 4  Pain Location: L hip/groin Pain Descriptors / Indicators: Aching;Sore Pain Intervention(s): Limited activity within patient's tolerance;Monitored during session;Premedicated before session;Ice applied    Home Living Family/patient expects to be discharged to:: Private residence Living Arrangements: Spouse/significant other Available Help at Discharge: Family         Home Equipment: Bedside commode;Shower seat      Prior Function Level of Independence: Independent          PT Goals (current goals can now be found in the care plan section) Acute Rehab PT Goals Patient Stated Goal: Move with less pain PT Goal Formulation: With patient Time For Goal Achievement: 03/26/15 Potential to Achieve Goals: Good Progress towards PT goals: Progressing toward goals    Frequency  7X/week    PT Plan Current plan remains appropriate    Co-evaluation             End of Session Equipment Utilized During Treatment: Gait belt Activity Tolerance: Patient tolerated treatment well Patient left: in bed;with call bell/phone within reach;with family/visitor present     Time: 1341-1410 PT Time Calculation (min) (ACUTE ONLY): 29 min  Charges:  $Gait Training: 8-22 mins $Therapeutic Activity: 8-22 mins                    G  Codes:      Annette [name] 03/23/2015, 3:33 PM

## 2015-03-23 NOTE — Evaluation (Signed)
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 All education was completed.  No further OT is needed at this time.    Follow Up Recommendations  No OT follow up    Equipment Recommendations  None recommended by OT    Recommendations for Other Services       Precautions / Restrictions Precautions Precautions: Fall      Mobility Bed Mobility   Bed Mobility: Supine to Sit     Supine to sit: Min assist Sit to supine: Min assist   General bed mobility comments: assist for LLE  Transfers   Equipment used: Rolling walker (2 wheeled) Transfers: Sit to/from Stand Sit to Stand: Min guard         General transfer comment: cues for UE placement from bed and LE placement from bed and 3:1    Balance                                            ADL Overall ADL's : Needs assistance/impaired     Grooming: Wash/dry face;Wash/dry hands;Standing;Supervision/safety                   Armed forces technical officer: Min guard;Ambulation;RW;Comfort height toilet   Toileting- Clothing Manipulation and Hygiene: Min guard;Sit to/from stand         General ADL Comments: ambulated to bathroom with min guard for safety.  She has used AE in the past and didn't feel she needed to review this. Pt will wait until she is at home to wash up.   She has a Secondary school teacher but is considering a new AE kit vs. buying a long netted sponge  Reviewed sequence for shower; pt did not wish to practice as her ledge is shorter     Vision     Perception     Praxis      Pertinent Vitals/Pain Pain Score: 7  (when stepping; 2 when standing) Pain Location: Lhip and groin Pain Descriptors / Indicators: Aching Pain Intervention(s): Limited activity within patient's  tolerance;Monitored during session;Premedicated before session;Repositioned;Ice applied     Hand Dominance     Extremity/Trunk Assessment Upper Extremity Assessment Upper Extremity Assessment: Overall WFL for tasks assessed           Communication Communication Communication: No difficulties   Cognition Arousal/Alertness: Awake/alert Behavior During Therapy: WFL for tasks assessed/performed Overall Cognitive Status: Within Functional Limits for tasks assessed                     General Comments       Exercises       Shoulder Instructions      Home Living Family/patient expects to be discharged to:: Private residence Living Arrangements: Spouse/significant other Available Help at Discharge: Family               Bathroom Shower/Tub: Walk-in Psychologist, prison and probation services: Standard     Home Equipment: Bedside commode;Shower seat          Prior Functioning/Environment Level of Independence: Independent             OT Diagnosis: Acute pain   OT Problem List:  OT Treatment/Interventions:      OT Goals(Current goals can be found in the care plan section) Acute Rehab OT Goals Patient Stated Goal: Move with less pain  OT Frequency:     Barriers to D/C:            Co-evaluation              End of Session    Activity Tolerance: Patient tolerated treatment well Patient left: in bed;with call bell/phone within reach;with family/visitor present   Time: 3539-1225 OT Time Calculation (min): 25 min Charges:  OT General Charges $OT Visit: 1 Procedure OT Evaluation $Initial OT Evaluation Tier I: 1 Procedure G-Codes:    Roshawn Lacina 2015-04-20, 12:59 PM  Lesle Chris, OTR/L [phone] 04-20-15

## 2015-03-23 NOTE — Discharge Summary (Signed)
Reviewed d/c instructions with pt and spouse including follow-up appointments, incision care, medications, and precautions.  Pt verbalized good understanding without questions.  Pt being d/c to home with [facility] care into care of spouse.

## 2015-03-23 NOTE — Care Management Note (Signed)
Case Management Note  Patient Details  Name: VERLEY PARISEAU MRN: [id number] Date of Birth: 12-Jun-1951  Subjective/Objective:                  Left Total Hip  Action/Plan: Home health services  Expected Discharge Date:  03/23/15               Expected Discharge Plan:  [place]  In-House Referral:  NA  Discharge planning Services  CM Consult  Post Acute Care Choice:  Home Health Choice offered to:  Patient  DME Arranged:  N/A DME Agency:  NA  HH Arranged:  PT HH Agency:  [place]  Status of Service:  Completed, signed off  Medicare Important Message Given:    Date Medicare IM Given:    Medicare IM give by:    Date Additional Medicare IM Given:    Additional Medicare Important Message give by:     If discussed at [place] of Stay Meetings, dates discussed:    Additional Comments: CM spoke with patient at the bedside. [place] was set-up preoperatively, patient agrees. Has a RW, BSC, shower chair and cane at home. Patient has assistance at home.  Apolonio Schneiders, RN 03/23/2015, 11:36 AM

## 2015-03-23 NOTE — Progress Notes (Signed)
   Subjective: 1 Day Post-Op Procedure(s) (LRB): LEFT TOTAL HIP ARTHROPLASTY ANTERIOR APPROACH (Left)  Pt c/o mild to moderate pain but tolerable Would like to go home today after PT Denies any new symptoms or issues Patient reports pain as moderate.  Objective:   VITALS:   Filed Vitals:   03/23/15 0537  BP: 103/38  Pulse: 45  Temp: 98 F (36.7 C)  Resp: 17    Left hip incision healing well nv intact distally No rashes or edema Drain pulled  LABS  Recent Labs  03/23/15 0440  HGB 11.2*  HCT 33.1*  WBC 11.6*  PLT 228     Recent Labs  03/23/15 0440  NA 136  K 4.4  BUN 11  CREATININE 0.71  GLUCOSE 155*     Assessment/Plan: 1 Day Post-Op Procedure(s) (LRB): LEFT TOTAL HIP ARTHROPLASTY ANTERIOR APPROACH (Left) PT/OT If safe from therapy standpoint, d/c home later today Pain control as needed    Merla Riches, MPAS, PA-C  03/23/2015, 7:27 AM

## 2015-03-23 NOTE — Evaluation (Signed)
Physical Therapy Evaluation Patient Details Name: Annette [name] MRN: [id number] DOB: 08-21-1950 Today's Date: 03/23/2015   History of Present Illness  L THR - s/p R THR (11)  Clinical Impression  Pt s/p L THR presents with decreased L LE strength/ROM and post op pain limiting functional mobility.  Pt should progress to dc home with family assist and HHPT follow up.      Follow Up Recommendations Home health PT    Equipment Recommendations  None recommended by PT    Recommendations for Other Services OT consult     Precautions / Restrictions Precautions Precautions: Fall Restrictions Weight Bearing Restrictions: No Other Position/Activity Restrictions: WBAT      Mobility  Bed Mobility Overal bed mobility: Needs Assistance Bed Mobility: Supine to Sit     Supine to sit: Min assist     General bed mobility comments: cues for sequence and [place] of R LE to self assist  Transfers Overall transfer level: Needs assistance Equipment used: Rolling walker (2 wheeled) Transfers: Sit to/from Stand Sit to Stand: Min guard         General transfer comment: cues for LE management and use of UEs to self assist  Ambulation/Gait Ambulation/Gait assistance: Min assist Ambulation Distance (Feet): 60 Feet Assistive device: Rolling walker (2 wheeled) Gait Pattern/deviations: Step-to pattern;Decreased step length - right;Decreased step length - left;Shuffle;Trunk flexed     General Gait Details: cues for seqence, posture and position from [employer]            Wheelchair Mobility    Modified Rankin (Stroke Patients Only)       Balance                                             Pertinent Vitals/Pain Pain Assessment: 0-10 Pain Score: 5  Pain Location: L hip Pain Descriptors / Indicators: Aching;Sore Pain Intervention(s): Limited activity within patient's tolerance;Monitored during session;Premedicated before session;Ice applied;Patient requesting  pain meds-RN notified    Home Living Family/patient expects to be discharged to:: Private residence Living Arrangements: Spouse/significant other Available Help at Discharge: Family Type of Home: House Home Access: Stairs to enter Entrance Stairs-Rails: Psychiatric nurse of Steps: 2 Home Layout: One level Home Equipment: Environmental consultant - 2 wheels      Prior Function Level of Independence: Independent               Hand Dominance        Extremity/Trunk Assessment   Upper Extremity Assessment: Overall WFL for tasks assessed           Lower Extremity Assessment: LLE deficits/detail   LLE Deficits / Details: 2+/5 hip with AAROM at hip to 80 flex and 20 abd  Cervical / Trunk Assessment: Normal  Communication   Communication: No difficulties  Cognition Arousal/Alertness: Awake/alert Behavior During Therapy: WFL for tasks assessed/performed Overall Cognitive Status: Within Functional Limits for tasks assessed                      General Comments      Exercises Total Joint Exercises Ankle Circles/Pumps: AROM;Both;15 reps;Supine Quad Sets: AROM;Both;10 reps;Supine Heel Slides: AAROM;15 reps;Supine;Left Hip ABduction/ADduction: AAROM;Left;10 reps;Supine      Assessment/Plan    PT Assessment Patient needs continued PT services  PT Diagnosis Difficulty walking   PT Problem List Decreased strength;Decreased range of motion;Decreased activity  tolerance;Decreased mobility;Decreased knowledge of use of DME;Pain  PT Treatment Interventions DME instruction;Gait training;Stair training;Functional mobility training;Therapeutic activities;Therapeutic exercise;Patient/family education   PT Goals (Current goals can be found in the Care Plan section) Acute Rehab PT Goals Patient Stated Goal: Move with less pain PT Goal Formulation: With patient Time For Goal Achievement: 03/26/15 Potential to Achieve Goals: Good    Frequency 7X/week   Barriers to  discharge        Co-evaluation               End of Session Equipment Utilized During Treatment: Gait belt Activity Tolerance: Patient limited by fatigue;Patient limited by pain Patient left: in chair;with call bell/phone within reach;with nursing/sitter in room Nurse Communication: Mobility status         Time: 6195-0932 PT Time Calculation (min) (ACUTE ONLY): 27 min   Charges:     PT Treatments $Therapeutic Exercise: 8-22 mins   PT G Codes:        Theressa Piedra 03-27-15, 12:16 PM

## 2015-03-25 ENCOUNTER — Encounter (HOSPITAL_COMMUNITY): Payer: Self-pay | Admitting: Orthopedic Surgery

## 2015-08-19 MED FILL — LEVOTHYROXINE 88 MCG TABLET: 88 | 90 days supply | Qty: 90 | Fill #1

## 2015-08-30 DIAGNOSIS — L820 Inflamed seborrheic keratosis: Secondary | ICD-10-CM | POA: Diagnosis not present

## 2015-09-10 DIAGNOSIS — H2511 Age-related nuclear cataract, right eye: Secondary | ICD-10-CM | POA: Diagnosis not present

## 2015-09-10 DIAGNOSIS — H18412 Arcus senilis, left eye: Secondary | ICD-10-CM | POA: Diagnosis not present

## 2015-09-10 DIAGNOSIS — H2512 Age-related nuclear cataract, left eye: Secondary | ICD-10-CM | POA: Diagnosis not present

## 2015-09-10 DIAGNOSIS — H18411 Arcus senilis, right eye: Secondary | ICD-10-CM | POA: Diagnosis not present

## 2015-09-10 DIAGNOSIS — M5136 Other intervertebral disc degeneration, lumbar region: Secondary | ICD-10-CM | POA: Diagnosis not present

## 2015-09-10 MED FILL — BESIVANCE 0.6% SUSP: 0.6 | 30 days supply | Qty: 5 | Fill #0

## 2015-09-18 DIAGNOSIS — J309 Allergic rhinitis, unspecified: Secondary | ICD-10-CM | POA: Diagnosis not present

## 2015-09-18 DIAGNOSIS — K580 Irritable bowel syndrome with diarrhea: Secondary | ICD-10-CM | POA: Diagnosis not present

## 2015-09-18 DIAGNOSIS — I10 Essential (primary) hypertension: Secondary | ICD-10-CM | POA: Diagnosis not present

## 2015-09-18 DIAGNOSIS — Z23 Encounter for immunization: Secondary | ICD-10-CM | POA: Diagnosis not present

## 2015-09-18 DIAGNOSIS — E7800 Pure hypercholesterolemia, unspecified: Secondary | ICD-10-CM | POA: Diagnosis not present

## 2015-09-18 DIAGNOSIS — R251 Tremor, unspecified: Secondary | ICD-10-CM | POA: Diagnosis not present

## 2015-09-18 DIAGNOSIS — M1990 Unspecified osteoarthritis, unspecified site: Secondary | ICD-10-CM | POA: Diagnosis not present

## 2015-09-18 DIAGNOSIS — Z0000 Encounter for general adult medical examination without abnormal findings: Secondary | ICD-10-CM | POA: Diagnosis not present

## 2015-09-18 DIAGNOSIS — E039 Hypothyroidism, unspecified: Secondary | ICD-10-CM | POA: Diagnosis not present

## 2015-09-18 MED FILL — VALACYCLOVIR HCL 500 MG TAB: 500 | 90 days supply | Qty: 90 | Fill #0

## 2015-09-18 MED FILL — HYDROCHLOROTHIAZIDE 12.5 MG: 12.5 | 20 days supply | Qty: 20 | Fill #0

## 2015-09-20 DIAGNOSIS — M5136 Other intervertebral disc degeneration, lumbar region: Secondary | ICD-10-CM | POA: Diagnosis not present

## 2015-10-03 MED FILL — LEVOTHYROXINE 100 MCG TAB: 100 | 90 days supply | Qty: 90 | Fill #0

## 2015-10-03 MED FILL — HYDROCHLOROTHIAZIDE 12.5 MG: 12.5 | 90 days supply | Qty: 90 | Fill #0

## 2015-10-04 DIAGNOSIS — H25811 Combined forms of age-related cataract, right eye: Secondary | ICD-10-CM | POA: Diagnosis not present

## 2015-10-04 DIAGNOSIS — H2511 Age-related nuclear cataract, right eye: Secondary | ICD-10-CM | POA: Diagnosis not present

## 2015-10-07 MED FILL — BESIVANCE 0.6% SUSP: 0.6 | 30 days supply | Qty: 5 | Fill #1

## 2015-10-14 DIAGNOSIS — H59021 Cataract (lens) fragments in eye following cataract surgery, right eye: Secondary | ICD-10-CM | POA: Diagnosis not present

## 2015-10-14 DIAGNOSIS — H44751 Retained (nonmagnetic) (old) foreign body in vitreous body, right eye: Secondary | ICD-10-CM | POA: Diagnosis not present

## 2015-10-22 MED FILL — PROPRANOLOL ER 60 MG CAP: 60 | 90 days supply | Qty: 90 | Fill #0

## 2015-11-04 DIAGNOSIS — Z1231 Encounter for screening mammogram for malignant neoplasm of breast: Secondary | ICD-10-CM | POA: Diagnosis not present

## 2015-11-04 DIAGNOSIS — Z01419 Encounter for gynecological examination (general) (routine) without abnormal findings: Secondary | ICD-10-CM | POA: Diagnosis not present

## 2015-11-04 DIAGNOSIS — Z6832 Body mass index (BMI) 32.0-32.9, adult: Secondary | ICD-10-CM | POA: Diagnosis not present

## 2015-12-23 DIAGNOSIS — E039 Hypothyroidism, unspecified: Secondary | ICD-10-CM | POA: Diagnosis not present

## 2015-12-27 ENCOUNTER — Encounter (INDEPENDENT_AMBULATORY_CARE_PROVIDER_SITE_OTHER): Payer: PPO | Admitting: Ophthalmology

## 2015-12-27 DIAGNOSIS — H538 Other visual disturbances: Secondary | ICD-10-CM

## 2015-12-27 DIAGNOSIS — H2512 Age-related nuclear cataract, left eye: Secondary | ICD-10-CM

## 2015-12-27 DIAGNOSIS — I10 Essential (primary) hypertension: Secondary | ICD-10-CM

## 2015-12-27 DIAGNOSIS — H35033 Hypertensive retinopathy, bilateral: Secondary | ICD-10-CM | POA: Diagnosis not present

## 2015-12-27 DIAGNOSIS — H43813 Vitreous degeneration, bilateral: Secondary | ICD-10-CM | POA: Diagnosis not present

## 2016-01-01 MED FILL — LEVOTHYROXINE 100 MCG TAB: 100 | 90 days supply | Qty: 90 | Fill #1

## 2016-01-01 MED FILL — HYDROCHLOROTHIAZIDE 12.5 MG: 12.5 | 90 days supply | Qty: 90 | Fill #1

## 2016-01-01 MED FILL — VALACYCLOVIR HCL 500 MG TAB: 500 | 90 days supply | Qty: 90 | Fill #1

## 2016-01-30 MED FILL — PROPRANOLOL ER 60 MG CAPSUL: 60 | 90 days supply | Qty: 90 | Fill #1

## 2016-03-26 DIAGNOSIS — E039 Hypothyroidism, unspecified: Secondary | ICD-10-CM | POA: Diagnosis not present

## 2016-03-26 DIAGNOSIS — R635 Abnormal weight gain: Secondary | ICD-10-CM | POA: Diagnosis not present

## 2016-03-26 DIAGNOSIS — G4700 Insomnia, unspecified: Secondary | ICD-10-CM | POA: Diagnosis not present

## 2016-03-26 DIAGNOSIS — I10 Essential (primary) hypertension: Secondary | ICD-10-CM | POA: Diagnosis not present

## 2016-03-26 MED FILL — traZODone HCL 50 MG TABS: 50 | 30 days supply | Qty: 30 | Fill #0

## 2016-03-30 MED FILL — LEVOTHYROXINE 100 MCG TAB: 100 | 90 days supply | Qty: 90 | Fill #0

## 2016-03-30 MED FILL — HYDROCHLOROTHIAZIDE 12.5 MG: 12.5 | 90 days supply | Qty: 90 | Fill #2

## 2016-04-09 MED FILL — VALACYCLOVIR HCL 500 MG TAB: 500 | 90 days supply | Qty: 90 | Fill #2

## 2016-04-14 DIAGNOSIS — L309 Dermatitis, unspecified: Secondary | ICD-10-CM | POA: Diagnosis not present

## 2016-04-14 DIAGNOSIS — L820 Inflamed seborrheic keratosis: Secondary | ICD-10-CM | POA: Diagnosis not present

## 2016-04-14 DIAGNOSIS — D1800 Hemangioma unspecified site: Secondary | ICD-10-CM | POA: Diagnosis not present

## 2016-04-14 DIAGNOSIS — L720 Epidermal cyst: Secondary | ICD-10-CM | POA: Diagnosis not present

## 2016-04-14 MED FILL — FLUOCINONIDE 0.05% SOLUTION: 0.05 | 30 days supply | Qty: 60 | Fill #0

## 2016-04-26 MED FILL — PROPRANOLOL ER 60 MG CAPSUL: 60 | 90 days supply | Qty: 90 | Fill #2

## 2016-05-04 DIAGNOSIS — D485 Neoplasm of uncertain behavior of skin: Secondary | ICD-10-CM | POA: Diagnosis not present

## 2016-05-04 DIAGNOSIS — L989 Disorder of the skin and subcutaneous tissue, unspecified: Secondary | ICD-10-CM | POA: Diagnosis not present

## 2016-05-17 HISTORY — PX: INCISE AND DRAIN ABCESS: PRO64

## 2016-05-31 ENCOUNTER — Encounter (HOSPITAL_COMMUNITY): Payer: Self-pay | Admitting: *Deleted

## 2016-05-31 ENCOUNTER — Ambulatory Visit (HOSPITAL_COMMUNITY)
Admission: EM | Admit: 2016-05-31 | Discharge: 2016-05-31 | Disposition: A | Discharge status: Home / Self Care | Payer: PPO | Attending: Internal Medicine | Admitting: Internal Medicine

## 2016-05-31 DIAGNOSIS — Z23 Encounter for immunization: Secondary | ICD-10-CM

## 2016-05-31 DIAGNOSIS — M25532 Pain in left wrist: Secondary | ICD-10-CM

## 2016-05-31 MED ORDER — TETANUS-DIPHTH-ACELL PERTUSSIS 5-2.5-18.5 LF-MCG/0.5 IM SUSP
INTRAMUSCULAR | Status: AC
  Filled 2016-05-31: qty 0.5

## 2016-05-31 MED ORDER — TETANUS-DIPHTH-ACELL PERTUSSIS 5-2.5-18.5 LF-MCG/0.5 IM SUSP
0.5000 mL | Freq: Once | INTRAMUSCULAR | Status: AC
  Administered 2016-05-31: 0.5 mL via INTRAMUSCULAR

## 2016-05-31 MED ORDER — DOXYCYCLINE HYCLATE 100 MG PO CAPS: 100.0000 mg | ORAL_CAPSULE | Freq: Two times a day (BID) | ORAL | 0 refills | Status: DC

## 2016-05-31 NOTE — ED Triage Notes (Signed)
Pain  l   Wrist    Started  Getting   Sore  Last      Night         Swelling      Tender  To  The  Touch      The     Pt   denys  Any  specefic  Injury

## 2016-05-31 NOTE — Discharge Instructions (Signed)
YOU SHOULD SEE DR. Burney Gauze FOR FOLLOW UP   CONTINUE SYMPTOMATIC CARE AT HOME.

## 2016-06-01 ENCOUNTER — Other Ambulatory Visit: Payer: Self-pay | Admitting: Orthopedic Surgery

## 2016-06-01 DIAGNOSIS — L03124 Acute lymphangitis of left upper limb: Secondary | ICD-10-CM | POA: Diagnosis not present

## 2016-06-01 DIAGNOSIS — L089 Local infection of the skin and subcutaneous tissue, unspecified: Secondary | ICD-10-CM | POA: Diagnosis not present

## 2016-06-01 DIAGNOSIS — M65132 Other infective (teno)synovitis, left wrist: Secondary | ICD-10-CM | POA: Diagnosis not present

## 2016-06-01 DIAGNOSIS — L03114 Cellulitis of left upper limb: Secondary | ICD-10-CM | POA: Diagnosis not present

## 2016-06-01 DIAGNOSIS — M65842 Other synovitis and tenosynovitis, left hand: Secondary | ICD-10-CM | POA: Diagnosis not present

## 2016-06-01 DIAGNOSIS — M79642 Pain in left hand: Secondary | ICD-10-CM | POA: Diagnosis not present

## 2016-06-01 NOTE — ED Provider Notes (Signed)
CSN: [id number]     Arrival date & time 05/31/16  1848 History   First MD Initiated Contact with Patient 05/31/16 2014     Chief Complaint  Patient presents with  . Wrist Pain   (Consider location/radiation/quality/duration/timing/severity/associated sxs/prior Treatment) HPI Annette 65 y/o f retired [employer] [name] states that she has has pain swelling in her left wrist for 1 day, thinks it may be infectious and requesting antibx.  She states she had a small scab that she picked off and thinks bacteria entered this wound and has infected her. She states she has a previous infection that required surgical drainage without  redness or swelling. Also requesting tetanus booster Past Medical History:  Diagnosis Date  . Arthritis    Right hip, end stage  . Difficulty sleeping    DUE TO PAIN  . Fluid retention    TAKES HCTZ  . Hemorrhoids   . Hypertension   . Hypothyroidism   . Irritable bowel syndrome   . Shingles 2004  . Tremors of nervous system    TAKES PROPRANOLOL TO TX  . Varicose veins    Past Surgical History:  Procedure Laterality Date  . ABDOMINAL HYSTERECTOMY  2000  . CHOLECYSTECTOMY  1994  . COLONOSCOPY    . ERCP  2004   sludge in CBD (jaundiced)  . ESOPHAGOGASTRODUODENOSCOPY    . INCISION / DRAINAGE HAND / FINGER     LEFT INDEX FINGER  . JOINT REPLACEMENT    . OVARIAN CYST REMOVAL  [id number]  . ROTATOR CUFF REPAIR     left  . TOTAL HIP ARTHROPLASTY  06/2010   right  . TOTAL HIP ARTHROPLASTY Left 03/22/2015   Procedure: LEFT TOTAL HIP ARTHROPLASTY ANTERIOR APPROACH;  Surgeon: Gaynelle Arabian, MD;  Location: [facility];  Service: Orthopedics;  Laterality: Left;   Family History  Problem Relation Age of Onset  . Alzheimer's disease Father   . Stroke Mother    Social History  Substance Use Topics  . Smoking status: Former Smoker    Quit date: 03/14/2009  . Smokeless tobacco: Never Used  . Alcohol use Yes     Comment: RARE   OB History    No data available     Review of  Systems  Denies: HEADACHE, NAUSEA, ABDOMINAL PAIN, CHEST PAIN, CONGESTION, DYSURIA, SHORTNESS OF BREATH  Allergies  Augmentin [amoxicillin-pot clavulanate]; Codeine; and Other  Home Medications   Prior to Admission medications   Medication Sig Start Date End Date Taking? Authorizing Provider  doxycycline (VIBRAMYCIN) 100 MG capsule Take 1 capsule (100 mg total) by mouth 2 (two) times daily. 05/31/16   Konrad Felix, PA  hydrochlorothiazide (MICROZIDE) 12.5 MG capsule Take 12.5 mg by mouth every morning.    Historical Provider, MD  HYDROmorphone (DILAUDID) 2 MG tablet Take 1-2 tablets (2-4 mg total) by mouth every 4 (four) hours as needed for severe pain. 03/22/15   Gaynelle Arabian, MD  levothyroxine (SYNTHROID, LEVOTHROID) 88 MCG tablet Take 88 mcg by mouth daily before breakfast.    Historical Provider, MD  methocarbamol (ROBAXIN) 500 MG tablet Take 1 tablet (500 mg total) by mouth every 6 (six) hours as needed for muscle spasms. 03/22/15   Gaynelle Arabian, MD  propranolol ER (INDERAL LA) 60 MG 24 hr capsule Take 60 mg by mouth daily.    Historical Provider, MD  rivaroxaban (XARELTO) 10 MG TABS tablet Take 1 tablet (10 mg total) by mouth daily with breakfast. 03/23/15   Gaynelle Arabian, MD  traMADol Veatrice Bourbon)  50 MG tablet Take 1-2 tablets (50-100 mg total) by mouth every 6 (six) hours as needed for moderate pain. 03/22/15   Gaynelle Arabian, MD   Meds Ordered and Administered this Visit   Medications  Tdap (BOOSTRIX) injection 0.5 mL (0.5 mLs Intramuscular Given 05/31/16 2031)    BP 140/69 (BP Location: Right Arm)   Pulse 76   Temp 98.6 F (37 C) (Oral)   Resp 18   SpO2 95%  No data found.   Physical Exam NURSES NOTES AND VITAL SIGNS REVIEWED. CONSTITUTIONAL: Well developed, well nourished, no acute distress HEENT: normocephalic, atraumatic EYES: Conjunctiva normal NECK:normal ROM, supple, no adenopathy PULMONARY:No respiratory distress, normal effort ABDOMINAL: Soft, ND, NT BS+, No  CVAT MUSCULOSKELETAL: Normal ROM of all extremities, right wrist is tender distal radial/carpal junction. Warm to touch. Some redness, no suggestion of cellulitis  SKIN: warm and dry without rash PSYCHIATRIC: Mood and affect, behavior are normal  Urgent Care Course   Clinical Course    Procedures (including critical care time)  Labs Review Labs Reviewed - No data to display  Imaging Review No results found.   Visual Acuity Review  Right Eye Distance:   Left Eye Distance:   Bilateral Distance:    Right Eye Near:   Left Eye Near:    Bilateral Near:       Discussed the possibility of gout with patient, she is certain that she has an infection and would like antibx (doxy) and she will arrange follow up with Dr. Burney Gauze.    MDM   1. Acute pain of left wrist     Patient is reassured that there are no issues that require transfer to higher level of care at this time or additional tests. Patient is advised to continue home symptomatic treatment. Patient is advised that if there are new or worsening symptoms to attend the emergency department, contact primary care provider, or return to UC. Instructions of care provided discharged home in stable condition.    THIS NOTE WAS GENERATED USING A VOICE RECOGNITION SOFTWARE PROGRAM. ALL REASONABLE EFFORTS  WERE MADE TO PROOFREAD THIS DOCUMENT FOR ACCURACY.  I have verbally reviewed the discharge instructions with the patient. A printed AVS was given to the patient.  All questions were answered prior to discharge.      Konrad Felix, [place] 06/01/16 1432

## 2016-06-04 DIAGNOSIS — L03114 Cellulitis of left upper limb: Secondary | ICD-10-CM | POA: Diagnosis not present

## 2016-06-04 DIAGNOSIS — M79642 Pain in left hand: Secondary | ICD-10-CM | POA: Diagnosis not present

## 2016-06-30 DIAGNOSIS — Z23 Encounter for immunization: Secondary | ICD-10-CM | POA: Diagnosis not present

## 2016-06-30 DIAGNOSIS — L820 Inflamed seborrheic keratosis: Secondary | ICD-10-CM | POA: Diagnosis not present

## 2016-06-30 DIAGNOSIS — Z85828 Personal history of other malignant neoplasm of skin: Secondary | ICD-10-CM | POA: Diagnosis not present

## 2016-07-03 MED FILL — LEVOTHYROXINE 100 MCG TAB: 100 | 90 days supply | Qty: 90 | Fill #1

## 2016-07-03 MED FILL — HYDROCHLOROTHIAZIDE 12.5 MG: 12.5 | 90 days supply | Qty: 90 | Fill #0

## 2016-07-21 MED FILL — VALACYCLOVIR HCL 500 MG TAB: 500 | 90 days supply | Qty: 90 | Fill #3

## 2016-07-21 MED FILL — PROPRANOLOL ER 60 MG CAPSUL: 60 | 90 days supply | Qty: 90 | Fill #0

## 2016-09-29 DIAGNOSIS — Z0000 Encounter for general adult medical examination without abnormal findings: Secondary | ICD-10-CM | POA: Diagnosis not present

## 2016-09-29 DIAGNOSIS — G4700 Insomnia, unspecified: Secondary | ICD-10-CM | POA: Diagnosis not present

## 2016-09-29 DIAGNOSIS — M8588 Other specified disorders of bone density and structure, other site: Secondary | ICD-10-CM | POA: Diagnosis not present

## 2016-09-29 DIAGNOSIS — I10 Essential (primary) hypertension: Secondary | ICD-10-CM | POA: Diagnosis not present

## 2016-09-29 DIAGNOSIS — E039 Hypothyroidism, unspecified: Secondary | ICD-10-CM | POA: Diagnosis not present

## 2016-09-29 DIAGNOSIS — E7800 Pure hypercholesterolemia, unspecified: Secondary | ICD-10-CM | POA: Diagnosis not present

## 2016-09-29 DIAGNOSIS — K580 Irritable bowel syndrome with diarrhea: Secondary | ICD-10-CM | POA: Diagnosis not present

## 2016-09-29 DIAGNOSIS — M1990 Unspecified osteoarthritis, unspecified site: Secondary | ICD-10-CM | POA: Diagnosis not present

## 2016-09-29 DIAGNOSIS — R251 Tremor, unspecified: Secondary | ICD-10-CM | POA: Diagnosis not present

## 2016-09-29 MED FILL — HYDROCHLOROTHIAZIDE 12.5 MG: 12.5 | 90 days supply | Qty: 90 | Fill #1

## 2016-09-30 MED FILL — LEVOTHYROXINE 100 MCG TAB: 100 | 90 days supply | Qty: 90 | Fill #0 | Status: TO

## 2016-10-15 NOTE — Progress Notes (Signed)
Subjective:   Annette [name] was seen in consultation in the movement disorder clinic at the request of Osborne Casco, MD.  The evaluation is for tremor.  Tremor started approximately 15 years ago and involves the bilateral hands but the L is worse than the R.  She is R hand dominant.  Tremor is most noticeable when the hand is in use, especially with fine motor skills.  She has trouble with makeup, sewing things.  It is socially embarrassing.  She notes it at restaurants.   There is a family hx of tremor in her mother and maternal GF and maternal sibling.    Affected by caffeine:  Doesn't drink (doesn't drink caffeine) Affected by alcohol:  Yes.   (rare EtOH) Affected by stress:  Yes.   Affected by fatigue: unknown Spills soup if on spoon:  Yes.   Spills glass of liquid if full:  Yes.   Affects ADL's (tying shoes, brushing teeth, etc):  Yes.    Current/Previously tried tremor medications: Inderal LA, 60 mg - on that for several years and it has helped but she thinks that she has become used to the medication.  No SE  Current medications that may exacerbate tremor:  n/a  Outside reports reviewed: historical medical records, lab reports and referral letter/letters.  Allergies  Allergen Reactions  . Augmentin [Amoxicillin-Pot Clavulanate] Nausea And Vomiting  . Codeine Other (See Comments)    Causes her to stay awake   . Other Hives    IV contrast     Outpatient Encounter Prescriptions as of 10/19/2016  Medication Sig  . hydrochlorothiazide (MICROZIDE) 12.5 MG capsule Take 12.5 mg by mouth every morning.  Marland Kitchen levothyroxine (SYNTHROID, LEVOTHROID) 88 MCG tablet Take 88 mcg by mouth daily before breakfast.  . propranolol ER (INDERAL LA) 60 MG 24 hr capsule Take 60 mg by mouth daily.  . valACYclovir (VALTREX) 500 MG tablet   . [DISCONTINUED] doxycycline (VIBRAMYCIN) 100 MG capsule Take 1 capsule (100 mg total) by mouth 2 (two) times daily.  . [DISCONTINUED] HYDROmorphone  (DILAUDID) 2 MG tablet Take 1-2 tablets (2-4 mg total) by mouth every 4 (four) hours as needed for severe pain.  . [DISCONTINUED] methocarbamol (ROBAXIN) 500 MG tablet Take 1 tablet (500 mg total) by mouth every 6 (six) hours as needed for muscle spasms.  . [DISCONTINUED] rivaroxaban (XARELTO) 10 MG TABS tablet Take 1 tablet (10 mg total) by mouth daily with breakfast.  . [DISCONTINUED] traMADol (ULTRAM) 50 MG tablet Take 1-2 tablets (50-100 mg total) by mouth every 6 (six) hours as needed for moderate pain.   No facility-administered encounter medications on file as of 10/19/2016.     Past Medical History:  Diagnosis Date  . Arthritis    Right hip, end stage  . Difficulty sleeping    DUE TO PAIN  . Fluid retention    TAKES HCTZ  . Hemorrhoids   . Hypertension   . Hypothyroidism   . Irritable bowel syndrome   . Shingles 2004  . Tremors of nervous system    TAKES PROPRANOLOL TO TX  . Varicose veins     Past Surgical History:  Procedure Laterality Date  . ABDOMINAL HYSTERECTOMY  2000  . CATARACT EXTRACTION Right   . CHOLECYSTECTOMY  1994  . COLONOSCOPY    . ERCP  2004   sludge in CBD (jaundiced)  . ESOPHAGOGASTRODUODENOSCOPY    . INCISE AND DRAIN ABCESS Left 05/2016   wrist  . INCISION / DRAINAGE HAND /  FINGER     LEFT INDEX FINGER  . JOINT REPLACEMENT    . OVARIAN CYST REMOVAL  [id number]  . ROTATOR CUFF REPAIR     left  . TOTAL HIP ARTHROPLASTY  06/2010   right  . TOTAL HIP ARTHROPLASTY Left 03/22/2015   Procedure: LEFT TOTAL HIP ARTHROPLASTY ANTERIOR APPROACH;  Surgeon: Gaynelle Arabian, MD;  Location: [facility];  Service: Orthopedics;  Laterality: Left;    Social History   Social History  . Marital status: Divorced    Spouse name: N/A  . Number of children: 1  . Years of education: N/A   Occupational History  . retired     Multimedia programmer   Social History Main Topics  . Smoking status: Former Smoker    Quit date: 03/14/2009  . Smokeless tobacco: Never Used  .  Alcohol use Yes     Comment: beer every 6-8 months  . Drug use: No  . Sexual activity: Not on file   Other Topics Concern  . Not on file   Social History Narrative   [employer]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 1 son   1 caffeine drink daily   Updated 06/01/2013    Family Status  Relation Status  . Father Deceased  . Mother Deceased  . Sister Alive  . Sister Alive  . Son Alive    Review of Systems Some MS CP - "not substernal."  Some DOE that she attributes to being out of shape.  Admits to fatigue.  Admits to some holocephalic headaches without lateralizing features.  A complete 10 system ROS was obtained and was negative apart from what is mentioned.   Objective:   VITALS:   Vitals:   10/19/16 0853  BP: 100/70  Pulse: 82  SpO2: 97%  Weight: 206 lb (93.4 kg)  Height: 5\' 7"  (1.702 m)   Gen:  Appears stated age and in NAD. HEENT:  Normocephalic, atraumatic. The mucous membranes are moist. The superficial temporal arteries are without ropiness or tenderness. Cardiovascular: Regular rate and rhythm. Lungs: Clear to auscultation bilaterally. Neck: There are no carotid bruits noted bilaterally.  NEUROLOGICAL:  Orientation:  The patient is alert and oriented x 3.  Recent and remote memory are intact.  Attention span and concentration are normal.  Able to name objects and repeat without trouble.  Fund of knowledge is appropriate Cranial nerves: There is good facial symmetry. The pupils are equal round and reactive to light bilaterally. Fundoscopic exam reveals clear disc margins bilaterally. Extraocular muscles are intact and visual fields are full to confrontational testing. Speech is fluent and clear. Soft palate rises symmetrically and there is no tongue deviation. Hearing is intact to conversational tone. Tone: Tone is good throughout. Sensation: Sensation is intact to light touch and pinprick throughout (facial, extremities - UE/LE). Vibration is intact at the bilateral big  toe. There is no extinction with double simultaneous stimulation. There is no sensory dermatomal level identified. Coordination:  The patient has no dysdiadichokinesia or dysmetria. Motor: Strength is 5/5 in the bilateral upper and lower extremities.  Shoulder shrug is equal bilaterally.  There is no pronator drift.  There are no fasciculations noted. DTR's: Deep tendon reflexes are 2-/4 at the bilateral biceps, triceps, brachioradialis, patella and 1/4 at the bilateral achilles.  Plantar responses are downgoing bilaterally. Gait and Station: The patient is able to ambulate without difficulty. The patient has minimal trouble ambulating in a tandem fashion. The patient is able to ambulate in a tandem  fashion. The patient is able to stand in the Romberg position.   MOVEMENT EXAM: Tremor:  There is a tremor in the UE, noted most significantly with action; it is mild and somewhat worse when given a weight.  She has trouble with Archimedes spirals, left more than right.  She is right-hand dominant.  She has no tremor at rest.  She spills water when asked to pour the water from one glass to another.    Labs:    I have lab work dated 09/29/2016.  Sodium was 138, potassium 4.2, chloride 97, CO2 34, BUN 14 and creatinine 0.82.  Glucose was 102.  AST 18, ALT 12 and alkaline phosphatase 87.  Total cholesterol was elevated at 205, but was improved compared to one year prior at 284.  Her TSH was normal at 1.94.     Assessment/Plan:   1.  Essential Tremor.  -This is evidenced by the symmetrical nature and longstanding hx of gradually getting worse.  She also has a strong family history of tremor.  We discussed nature and pathophysiology.  We discussed that this can continue to gradually get worse with time.  We discussed that some medications can worsen this, as can caffeine use.  We discussed medication therapy as well as surgical therapy.  Talked about raising her propranolol, but she already has fatigue and  this can worsen that.  In addition, her blood pressure is already fairly low, although her pulse could likely tolerate the increase.  Ultimately, we decided to add primidone.   Risks, benefits, side effects and alternative therapies were discussed.  The opportunity to ask questions was given and they were answered to the best of my ability.  The patient expressed understanding and willingness to follow the outlined treatment protocols.    2.  Follow up is anticipated in the next few months, sooner should new neurologic issues arise.  Much greater than 50% of this visit was spent in counseling and coordinating care.  Total face to face time:  45 min  CC:  Osborne Casco, MD

## 2016-10-19 ENCOUNTER — Encounter: Payer: Self-pay | Admitting: Neurology

## 2016-10-19 ENCOUNTER — Ambulatory Visit (INDEPENDENT_AMBULATORY_CARE_PROVIDER_SITE_OTHER): Payer: PPO | Admitting: Neurology

## 2016-10-19 VITALS — BP 100/70 | HR 82 | Ht 67.0 in | Wt 206.0 lb

## 2016-10-19 DIAGNOSIS — G250 Essential tremor: Secondary | ICD-10-CM | POA: Diagnosis not present

## 2016-10-19 MED ORDER — PRIMIDONE 50 MG PO TABS: 50.0000 mg | ORAL_TABLET | Freq: Every day | ORAL | 2 refills | Status: DC

## 2016-10-19 MED FILL — PRIMIDONE 50 MG TABLET: 50 | 30 days supply | Qty: 30 | Fill #0

## 2016-10-19 NOTE — Patient Instructions (Signed)
1. Start Primidone 50 mg tablets. Take 1/2 tablet for 4 nights, then increase to 1 tablet. Prescription has been sent to your pharmacy. The first dose of medication can cause some dizziness/nausea that should go away after the first dose.   

## 2016-11-06 MED FILL — VALACYCLOVIR HCL 500 MG TAB: 500 | 90 days supply | Qty: 90 | Fill #0 | Status: TO

## 2016-11-10 DIAGNOSIS — M8588 Other specified disorders of bone density and structure, other site: Secondary | ICD-10-CM | POA: Diagnosis not present

## 2016-11-10 DIAGNOSIS — M810 Age-related osteoporosis without current pathological fracture: Secondary | ICD-10-CM | POA: Diagnosis not present

## 2016-12-03 DIAGNOSIS — L820 Inflamed seborrheic keratosis: Secondary | ICD-10-CM | POA: Diagnosis not present

## 2016-12-03 DIAGNOSIS — L821 Other seborrheic keratosis: Secondary | ICD-10-CM | POA: Diagnosis not present

## 2016-12-03 DIAGNOSIS — D485 Neoplasm of uncertain behavior of skin: Secondary | ICD-10-CM | POA: Diagnosis not present

## 2017-01-20 ENCOUNTER — Ambulatory Visit: Payer: PPO | Admitting: Neurology

## 2017-01-25 DIAGNOSIS — H26491 Other secondary cataract, right eye: Secondary | ICD-10-CM | POA: Diagnosis not present

## 2017-01-25 DIAGNOSIS — H02831 Dermatochalasis of right upper eyelid: Secondary | ICD-10-CM | POA: Diagnosis not present

## 2017-01-25 DIAGNOSIS — H2512 Age-related nuclear cataract, left eye: Secondary | ICD-10-CM | POA: Diagnosis not present

## 2017-01-25 DIAGNOSIS — H35351 Cystoid macular degeneration, right eye: Secondary | ICD-10-CM | POA: Diagnosis not present

## 2017-03-05 DIAGNOSIS — L309 Dermatitis, unspecified: Secondary | ICD-10-CM | POA: Diagnosis not present

## 2017-03-05 DIAGNOSIS — L820 Inflamed seborrheic keratosis: Secondary | ICD-10-CM | POA: Diagnosis not present

## 2017-03-05 DIAGNOSIS — Z85828 Personal history of other malignant neoplasm of skin: Secondary | ICD-10-CM | POA: Diagnosis not present

## 2017-03-05 DIAGNOSIS — L821 Other seborrheic keratosis: Secondary | ICD-10-CM | POA: Diagnosis not present

## 2017-03-09 DIAGNOSIS — H35351 Cystoid macular degeneration, right eye: Secondary | ICD-10-CM | POA: Diagnosis not present

## 2017-03-09 DIAGNOSIS — H02413 Mechanical ptosis of bilateral eyelids: Secondary | ICD-10-CM | POA: Diagnosis not present

## 2017-03-26 DIAGNOSIS — E039 Hypothyroidism, unspecified: Secondary | ICD-10-CM | POA: Diagnosis not present

## 2017-03-29 ENCOUNTER — Other Ambulatory Visit: Payer: Self-pay | Admitting: Acute Care

## 2017-03-29 DIAGNOSIS — Z122 Encounter for screening for malignant neoplasm of respiratory organs: Secondary | ICD-10-CM

## 2017-03-29 DIAGNOSIS — Z87891 Personal history of nicotine dependence: Secondary | ICD-10-CM

## 2017-04-14 ENCOUNTER — Ambulatory Visit (INDEPENDENT_AMBULATORY_CARE_PROVIDER_SITE_OTHER)
Admission: RE | Admit: 2017-04-14 | Discharge: 2017-04-14 | Disposition: A | Discharge status: Home / Self Care | Payer: PPO | Source: Ambulatory Visit | Attending: Acute Care | Admitting: Acute Care

## 2017-04-14 ENCOUNTER — Encounter: Payer: Self-pay | Admitting: Acute Care

## 2017-04-14 ENCOUNTER — Ambulatory Visit (INDEPENDENT_AMBULATORY_CARE_PROVIDER_SITE_OTHER): Payer: PPO | Admitting: Acute Care

## 2017-04-14 DIAGNOSIS — Z122 Encounter for screening for malignant neoplasm of respiratory organs: Secondary | ICD-10-CM

## 2017-04-14 DIAGNOSIS — Z87891 Personal history of nicotine dependence: Secondary | ICD-10-CM | POA: Diagnosis not present

## 2017-04-14 NOTE — Progress Notes (Signed)
Shared Decision Making Visit Lung Cancer Screening Program [phone])   Eligibility:  Age 66 y.o.  Pack Years Smoking History Calculation:  38-pack-year smoking history (# packs/per year x # years smoked)  Recent History of coughing up blood  no  Unexplained weight loss? no ( >Than 15 pounds within the last 6 months )  Prior History Lung / other cancer no (Diagnosis within the last 5 years already requiring surveillance chest CT Scans).  Smoking Status Former Smoker  Former Smokers: Years since quit: 7 years  Quit Date: 2011  Visit Components:  Discussion included one or more decision making aids. yes  Discussion included risk/benefits of screening. yes  Discussion included potential follow up diagnostic testing for abnormal scans. yes  Discussion included meaning and risk of over diagnosis. yes  Discussion included meaning and risk of False Positives. yes  Discussion included meaning of total radiation exposure. yes  Counseling Included:  Importance of adherence to annual lung cancer LDCT screening. yes  Impact of comorbidities on ability to participate in the program. yes  Ability and willingness to under diagnostic treatment. yes  Smoking Cessation Counseling:  Current Smokers:   Discussed importance of smoking cessation. yes  Information about tobacco cessation classes and interventions provided to patient. yes  Patient provided with "ticket" for LDCT Scan. yes  Symptomatic Patient. no  Counseling  Diagnosis Code: Tobacco Use Z72.0  Asymptomatic Patient yes  Counseling : yes intermediate counseling > 3 minutes  Former Smokers:   Discussed the importance of maintaining cigarette abstinence. yes  Diagnosis Code: Personal History of Nicotine Dependence. T73.220  Information about tobacco cessation classes and interventions provided to patient. Yes  Patient provided with "ticket" for LDCT Scan. yes  Written Order for Lung Cancer Screening with  LDCT placed in Epic. Yes (CT Chest Lung Cancer Screening Low Dose W/O CM) [id number] Z12.2-Screening of respiratory organs Z87.891-Personal history of nicotine dependence  I spent 25 minutes of face to face time with Annette [name] discussing the risks and benefits of lung cancer screening. We viewed a power point together that explained in detail the above noted topics. We took the time to pause the power point at intervals to allow for questions to be asked and answered to ensure understanding. We discussed that she had taken the single most powerful action possible to decrease her risk of developing lung cancer when she quit smoking. I counseled Annette [name] to remain smoke free, and to contact me if she ever had the desire to smoke again so that I can provide resources and tools to help support the effort to remain smoke free. We discussed the time and location of the scan, and that either  Doroteo Glassman RN or I will call with the results within  24-48 hours of receiving them. Annette [name] has my card and contact information in the event she needs to speak with me, in addition to a copy of the power point we reviewed as a resource. Annette [name] verbalized understanding of all of the above and had no further questions upon leaving the office.     I explained to the patient that there has been a high incidence of coronary artery disease noted on these exams. I explained that this is a non-gated exam therefore degree or severity cannot be determined. This patient is not currently on statin therapy. I have asked the patient to follow-up with their PCP regarding any incidental finding of coronary artery disease and management with diet or medication as they  feel is clinically indicated. The patient verbalized understanding of the above and had no further questions.      Magdalen Spatz, NP 04/14/2017

## 2017-04-15 ENCOUNTER — Other Ambulatory Visit: Payer: Self-pay | Admitting: Acute Care

## 2017-04-15 DIAGNOSIS — Z122 Encounter for screening for malignant neoplasm of respiratory organs: Secondary | ICD-10-CM

## 2017-04-15 DIAGNOSIS — Z87891 Personal history of nicotine dependence: Secondary | ICD-10-CM

## 2017-05-24 DIAGNOSIS — D481 Neoplasm of uncertain behavior of connective and other soft tissue: Secondary | ICD-10-CM | POA: Diagnosis not present

## 2017-05-24 DIAGNOSIS — H02832 Dermatochalasis of right lower eyelid: Secondary | ICD-10-CM | POA: Diagnosis not present

## 2017-05-24 DIAGNOSIS — H02835 Dermatochalasis of left lower eyelid: Secondary | ICD-10-CM | POA: Diagnosis not present

## 2017-05-24 DIAGNOSIS — H02413 Mechanical ptosis of bilateral eyelids: Secondary | ICD-10-CM | POA: Diagnosis not present

## 2017-09-06 DIAGNOSIS — L500 Allergic urticaria: Secondary | ICD-10-CM | POA: Diagnosis not present

## 2017-10-01 DIAGNOSIS — M79672 Pain in left foot: Secondary | ICD-10-CM | POA: Diagnosis not present

## 2017-10-07 DIAGNOSIS — R0600 Dyspnea, unspecified: Secondary | ICD-10-CM | POA: Diagnosis not present

## 2017-10-07 DIAGNOSIS — M8430XA Stress fracture, unspecified site, initial encounter for fracture: Secondary | ICD-10-CM | POA: Diagnosis not present

## 2017-10-19 DIAGNOSIS — M1990 Unspecified osteoarthritis, unspecified site: Secondary | ICD-10-CM | POA: Diagnosis not present

## 2017-10-19 DIAGNOSIS — Z1389 Encounter for screening for other disorder: Secondary | ICD-10-CM | POA: Diagnosis not present

## 2017-10-19 DIAGNOSIS — R251 Tremor, unspecified: Secondary | ICD-10-CM | POA: Diagnosis not present

## 2017-10-19 DIAGNOSIS — I10 Essential (primary) hypertension: Secondary | ICD-10-CM | POA: Diagnosis not present

## 2017-10-19 DIAGNOSIS — G4700 Insomnia, unspecified: Secondary | ICD-10-CM | POA: Diagnosis not present

## 2017-10-19 DIAGNOSIS — E7800 Pure hypercholesterolemia, unspecified: Secondary | ICD-10-CM | POA: Diagnosis not present

## 2017-10-19 DIAGNOSIS — M8588 Other specified disorders of bone density and structure, other site: Secondary | ICD-10-CM | POA: Diagnosis not present

## 2017-10-19 DIAGNOSIS — Z0000 Encounter for general adult medical examination without abnormal findings: Secondary | ICD-10-CM | POA: Diagnosis not present

## 2017-10-19 DIAGNOSIS — K580 Irritable bowel syndrome with diarrhea: Secondary | ICD-10-CM | POA: Diagnosis not present

## 2017-10-19 DIAGNOSIS — Z1159 Encounter for screening for other viral diseases: Secondary | ICD-10-CM | POA: Diagnosis not present

## 2017-10-19 DIAGNOSIS — E039 Hypothyroidism, unspecified: Secondary | ICD-10-CM | POA: Diagnosis not present

## 2017-10-20 DIAGNOSIS — D2272 Melanocytic nevi of left lower limb, including hip: Secondary | ICD-10-CM | POA: Diagnosis not present

## 2017-10-20 DIAGNOSIS — D2262 Melanocytic nevi of left upper limb, including shoulder: Secondary | ICD-10-CM | POA: Diagnosis not present

## 2017-10-20 DIAGNOSIS — D2271 Melanocytic nevi of right lower limb, including hip: Secondary | ICD-10-CM | POA: Diagnosis not present

## 2017-10-20 DIAGNOSIS — D1801 Hemangioma of skin and subcutaneous tissue: Secondary | ICD-10-CM | POA: Diagnosis not present

## 2017-10-20 DIAGNOSIS — L821 Other seborrheic keratosis: Secondary | ICD-10-CM | POA: Diagnosis not present

## 2017-10-20 DIAGNOSIS — L218 Other seborrheic dermatitis: Secondary | ICD-10-CM | POA: Diagnosis not present

## 2017-10-20 DIAGNOSIS — D225 Melanocytic nevi of trunk: Secondary | ICD-10-CM | POA: Diagnosis not present

## 2017-10-21 DIAGNOSIS — M79672 Pain in left foot: Secondary | ICD-10-CM | POA: Diagnosis not present

## 2017-11-09 ENCOUNTER — Ambulatory Visit: Payer: PPO | Admitting: Cardiology

## 2017-11-11 DIAGNOSIS — M79672 Pain in left foot: Secondary | ICD-10-CM | POA: Diagnosis not present

## 2017-11-26 DIAGNOSIS — L570 Actinic keratosis: Secondary | ICD-10-CM | POA: Diagnosis not present

## 2017-12-13 ENCOUNTER — Encounter

## 2017-12-13 ENCOUNTER — Encounter: Payer: Self-pay | Admitting: Cardiology

## 2017-12-13 ENCOUNTER — Ambulatory Visit: Payer: PPO | Admitting: Cardiology

## 2017-12-13 VITALS — BP 130/82 | HR 62 | Ht 67.0 in | Wt 220.8 lb

## 2017-12-13 DIAGNOSIS — E7849 Other hyperlipidemia: Secondary | ICD-10-CM

## 2017-12-13 DIAGNOSIS — Z8249 Family history of ischemic heart disease and other diseases of the circulatory system: Secondary | ICD-10-CM | POA: Diagnosis not present

## 2017-12-13 DIAGNOSIS — R0609 Other forms of dyspnea: Secondary | ICD-10-CM | POA: Diagnosis not present

## 2017-12-13 DIAGNOSIS — I2510 Atherosclerotic heart disease of native coronary artery without angina pectoris: Secondary | ICD-10-CM | POA: Diagnosis not present

## 2017-12-13 DIAGNOSIS — Z01818 Encounter for other preprocedural examination: Secondary | ICD-10-CM | POA: Diagnosis not present

## 2017-12-13 MED ORDER — METOPROLOL TARTRATE 50 MG PO TABS: 50.0000 mg | ORAL_TABLET | Freq: Once | ORAL | 0 refills | Status: DC

## 2017-12-13 NOTE — Progress Notes (Signed)
PCP: Kelton Pillar, MD  Clinic Note: Chief Complaint  Patient presents with  . New Patient (Initial Visit)    had lung scan, showed plaque in arteries, SOB noted, family hx of heart issues, noted some swelling in legs, denies chest pains    HPI: Annette [name] is a 67 y.o. female with a PMH below who presents today for Cardiology Evaluation 2/2 Coronary Calcification on Chest CT - seen @ the request of Kelton Pillar, MD. She is a former 38-pack-year smoker (quit 7 years ago).  Annette [name] was seen on October 07, 2017 by Dr.Griffin to discuss the results of her lung CT scan -that amongst other findings indicated possible coronary artery calcification..  As a result, she is referred for cardiology evaluation based on the fact she is been noting some exertional dyspnea..  Recent Hospitalizations: None  Studies Personally Reviewed - (if available, images/films reviewed: From Epic Chart or Care Everywhere)  Chest CT reviewed (August 2018): mild bronchial wall thickening and mild centrilobular and paraseptal emphysema..  There was mention of at least 2 vessel coronary arthrosclerosis/calcification.  Interval History: Annette [name] indicates that she does have some exertional dyspnea, but it is always thought that this was related to her being a long-term smoker.  She does have a mild chronic smoker's cough but less productive now.  She has some mild dependent lower extremity edema if she is on her on her feet or sitting for long time.  She denies any PND orthopnea. She is not really limited by exertional dyspnea but just feels a little winded when she goes up flights of steps etc.  She denies any exertional chest tightness or pressure.  No palpitations, lightheadedness, dizziness, weakness or syncope/near syncope. No TIA/amaurosis fugax symptoms. No melena, hematochezia, hematuria, or epstaxis. No claudication.  She takes propranolol for familial tremor.  She uses HCTZ treatments as needed  for mild edema.  Neither is being used for hypertension   ROS: A comprehensive was performed. Review of Systems  Constitutional: Negative for chills, fever and malaise/fatigue.  HENT: Negative for nosebleeds.   Respiratory: Negative for cough.        Per HPI  Cardiovascular:       Per HPI  Gastrointestinal: Negative for abdominal pain, blood in stool, heartburn and melena.  Genitourinary: Negative for hematuria.  Musculoskeletal: Positive for joint pain (Mild arthritis pains).  Neurological: Negative for dizziness.  Psychiatric/Behavioral: Negative.   All other systems reviewed and are negative.  I have reviewed and (if needed) personally updated the patient's problem list, medications, allergies, past medical and surgical history, social and family history.   Past Medical History:  Diagnosis Date  . Arthritis    Right hip, end stage  . Difficulty sleeping    DUE TO PAIN  . Fluid retention    TAKES HCTZ  . Hemorrhoids   . Hypertension   . Hypothyroidism   . Irritable bowel syndrome   . Shingles 2004  . Tremors of nervous system    TAKES PROPRANOLOL TO TX  . Varicose veins     Past Surgical History:  Procedure Laterality Date  . ABDOMINAL HYSTERECTOMY  2000  . CATARACT EXTRACTION Right   . CHOLECYSTECTOMY  1994  . COLONOSCOPY    . ERCP  2004   sludge in CBD (jaundiced)  . ESOPHAGOGASTRODUODENOSCOPY    . INCISE AND DRAIN ABCESS Left 05/2016   wrist  . INCISION / DRAINAGE HAND / FINGER     LEFT INDEX FINGER  .  JOINT REPLACEMENT    . OVARIAN CYST REMOVAL  [id number]  . ROTATOR CUFF REPAIR     left  . TOTAL HIP ARTHROPLASTY  06/2010   right  . TOTAL HIP ARTHROPLASTY Left 03/22/2015   Procedure: LEFT TOTAL HIP ARTHROPLASTY ANTERIOR APPROACH;  Surgeon: Gaynelle Arabian, MD;  Location: [facility];  Service: Orthopedics;  Laterality: Left;    Current Meds  Medication Sig  . aspirin EC 81 MG tablet Take 81 mg by mouth daily.  . hydrochlorothiazide (MICROZIDE) 12.5 MG  capsule Take 12.5 mg by mouth as needed.   Marland Kitchen levothyroxine (SYNTHROID, LEVOTHROID) 100 MCG tablet take 1 tablet by mouth every morning ON AN EMPTY STOMACH  . propranolol ER (INDERAL LA) 60 MG 24 hr capsule Take 60 mg by mouth daily.  . valACYclovir (VALTREX) 500 MG tablet   . [DISCONTINUED] levothyroxine (SYNTHROID, LEVOTHROID) 88 MCG tablet Take 100 mcg by mouth daily before breakfast.     Allergies  Allergen Reactions  . Augmentin [Amoxicillin-Pot Clavulanate] Nausea And Vomiting  . Codeine Other (See Comments)    Causes her to stay awake   . Other Hives    IV contrast     Social History   Tobacco Use  . Smoking status: Former Smoker    Packs/day: 1.00    Years: 38.00    Pack years: 38.00    Last attempt to quit: 03/14/2009    Years since quitting: 8.7  . Smokeless tobacco: Never Used  Substance Use Topics  . Alcohol use: Yes    Comment: beer every 6-8 months  . Drug use: No   Social History   Social History Narrative    GI Endoscopy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 1 son   1 caffeine drink daily   Updated 06/01/2013    family history includes Alzheimer's disease in her father; Breast cancer in her sister; CAD (age of onset: 7) in her mother; Stroke in her mother.  Wt Readings from Last 3 Encounters:  12/13/17 220 lb 12.8 oz (100.2 kg)  10/19/16 206 lb (93.4 kg)  03/22/15 204 lb (92.5 kg)    PHYSICAL EXAM BP 130/82 (BP Location: Right Arm)   Pulse 62   Ht 5\' 7"  (1.702 m)   Wt 220 lb 12.8 oz (100.2 kg)   BMI 34.58 kg/m  Physical Exam  Constitutional: She is oriented to person, place, and time. She appears well-developed and well-nourished. No distress.  Moderately obese.  Otherwise healthy-appearing.  Well-groomed  HENT:  Head: Normocephalic and atraumatic.  Eyes: Pupils are equal, round, and reactive to light. Conjunctivae and EOM are normal.  Neck: Normal range of motion. Neck supple. No hepatojugular reflux and no JVD present. Carotid bruit is not present.    Cardiovascular: Normal rate, regular rhythm, normal heart sounds, intact distal pulses and normal pulses.  No extrasystoles are present. PMI is not displaced (Difficult to palpate). Exam reveals no gallop and no friction rub.  No murmur heard. Pulmonary/Chest: Effort normal and breath sounds normal. No respiratory distress. She has no wheezes. She has no rales.  Abdominal: Soft. Bowel sounds are normal. She exhibits no distension. There is no tenderness. There is no rebound.  Musculoskeletal: Normal range of motion. She exhibits no edema.  Neurological: She is alert and oriented to person, place, and time. No cranial nerve deficit.  Skin: Skin is warm and dry.  Psychiatric: She has a normal mood and affect. Her behavior is normal. Judgment and thought content normal.  Nursing note and  vitals reviewed.    Adult ECG Report  Rate: 62;  Rhythm: normal sinus rhythm and Normal axis, intervals and durations.;   Narrative Interpretation: Essentially normal   Other studies Reviewed: Additional studies/ records that were reviewed today include:  Recent Labs: October 19, 2017: TC 237, TG 130, LDL 159, HDL 53.  BUN/Cr 15/0.9.  TSH 4.17.    ASSESSMENT / PLAN: Problem List Items Addressed This Visit    Hyperlipidemia due to dietary fat intake (Chronic)    Way out of target level LDL.  She has not been on any medications for this, but based on what we find on her coronary CT angiogram, I would suspect that we would need to potentially be more aggressive.  Talked about the importance of dietary modification and increased exercise.      Relevant Medications   aspirin EC 81 MG tablet   Family history of coronary artery disease    Mother had early CAD.  With lipid panel having LDL of 195, she certainly has the substrate for CAD, Especially in a Former Smoker. Plan: Coronary CT Angiogram --likely consider more aggressive risk factor modification.      Relevant Orders   CT CORONARY MORPH W/CTA COR  W/SCORE W/CA W/CM &/OR WO/CM   CT CORONARY FRACTIONAL FLOW RESERVE DATA PREP   CT CORONARY FRACTIONAL FLOW RESERVE FLUID ANALYSIS   Basic metabolic panel   EKG 09-TOIZ   DOE (dyspnea on exertion)    Not overly symptomatic, however in light of her risk factors and coronary artery calcification on CT scan, we will proceed with coronary CT angiogram.  She is not having any heart failure symptoms or significant edema, therefore I am less reluctant to move forward to echocardiogram unless there is more pressing reasoning.      Relevant Orders   CT CORONARY MORPH W/CTA COR W/SCORE W/CA W/CM &/OR WO/CM   CT CORONARY FRACTIONAL FLOW RESERVE DATA PREP   CT CORONARY FRACTIONAL FLOW RESERVE FLUID ANALYSIS   Basic metabolic panel   EKG 12-WPYK   Coronary artery calcification seen on computed tomography (Chronic)    At least 2 vessel coronary artery calcification noted on CT scan.  This does not either quantify or characterize the extent of calcification/CAD.  She is a long-term smoker with very poorly controlled lipids and some exertional dyspnea.  Plan: Check Coronary Calcium Score with Coronary CT Angiogram and possible CT FFR to determine the amount and extent/severity of potential CAD. Based on these results, would probably need to be more aggressive with lipid management.      Relevant Medications   aspirin EC 81 MG tablet   Other Relevant Orders   CT CORONARY MORPH W/CTA COR W/SCORE W/CA W/CM &/OR WO/CM   CT CORONARY FRACTIONAL FLOW RESERVE DATA PREP   CT CORONARY FRACTIONAL FLOW RESERVE FLUID ANALYSIS   EKG 12-Lead    Other Visit Diagnoses    Pre-op testing    -  Primary   Relevant Orders   Basic metabolic panel      Current medicines are reviewed at length with the patient today.  (+/- concerns) n/a The following changes have been made:  n/a -- NEEDS TO CONSIDER STATIN  Patient Instructions  No Medication Changes     WILL SCHEDULE AT [facility] Your physician has  requested that you have cardiac CT. Cardiac computed tomography (CT) is a painless test that uses an x-ray machine to take clear, detailed pictures of your heart. For further information please  visit [email or web address]. Please follow instruction sheet as given.    Marland Kitchen.Your physician recommends that you schedule a follow-up appointment in 2 [place].  Studies Ordered:   Orders Placed This Encounter  Procedures  . CT CORONARY MORPH W/CTA COR W/SCORE W/CA W/CM &/OR WO/CM  . CT CORONARY FRACTIONAL FLOW RESERVE DATA PREP  . CT CORONARY FRACTIONAL FLOW RESERVE FLUID ANALYSIS  . Basic metabolic panel  . EKG 12-Lead      Glenetta Hew, M.D., M.S. Interventional Cardiologist   Pager # [phone] Phone # [phone] [address] [place], [place] [address]   Thank you for choosing [facility] at [facility]

## 2017-12-13 NOTE — Patient Instructions (Addendum)
No Medication Changes     WILL SCHEDULE AT [facility] Your physician has requested that you have cardiac CT. Cardiac computed tomography (CT) is a painless test that uses an x-ray machine to take clear, detailed pictures of your heart. For further information please visit [email or web address]. Please follow instruction sheet as given.    Marland Kitchen.Your physician recommends that you schedule a follow-up appointment in 2 [place].    INSTRUCTIONS FOR  CORONARY CTA    Please arrive at the [facility] main entrance of [facility] at (30-45 minutes prior to test start time)  [facility] [place], [place] [address] [phone]  Proceed to the [facility] Radiology Department (First Floor).  Please follow these instructions carefully (unless otherwise directed):  PLEASE HAVE LABS - BMP  AT LEAST ONE WEEK PRIOR TO TEST  On the Night Before the Test: . Drink plenty of water. . Do not consume any caffeinated/decaffeinated beverages or chocolate 12 hours prior to your test. . Do not take any antihistamines 12 hours prior to your test.    On the Day of the Test: . Drink plenty of water. Do not drink any water within one hour of the test. . Do not eat any food 4 hours prior to the test. . You may take your regular medications prior to the test. . - Take 50 mg of lopressor (metoprolol) one hour before the test. DO NOT TAKE THE PROPRANOLOL  THAT MORNING.   After the Test: . Drink plenty of water. . After receiving IV contrast, you may experience a mild flushed feeling. This is normal. . On occasion, you may experience a mild rash up to 24 hours after the test. This is not dangerous. If this occurs, you can take Benadryl 25 mg and increase your fluid intake. . If you experience trouble breathing, this can be serious. If it is severe call 911 IMMEDIATELY. If it is mild, please call our office.

## 2017-12-14 NOTE — Assessment & Plan Note (Signed)
Not overly symptomatic, however in light of her risk factors and coronary artery calcification on CT scan, we will proceed with coronary CT angiogram.  She is not having any heart failure symptoms or significant edema, therefore I am less reluctant to move forward to echocardiogram unless there is more pressing reasoning.

## 2017-12-14 NOTE — Assessment & Plan Note (Signed)
Way out of target level LDL.  She has not been on any medications for this, but based on what we find on her coronary CT angiogram, I would suspect that we would need to potentially be more aggressive.  Talked about the importance of dietary modification and increased exercise.

## 2017-12-14 NOTE — Assessment & Plan Note (Signed)
Mother had early CAD.  With lipid panel having LDL of 483, she certainly has the substrate for CAD, Especially in a Former Smoker. Plan: Coronary CT Angiogram --likely consider more aggressive risk factor modification.

## 2017-12-14 NOTE — Assessment & Plan Note (Signed)
At least 2 vessel coronary artery calcification noted on CT scan.  This does not either quantify or characterize the extent of calcification/CAD.  She is a long-term smoker with very poorly controlled lipids and some exertional dyspnea.  Plan: Check Coronary Calcium Score with Coronary CT Angiogram and possible CT FFR to determine the amount and extent/severity of potential CAD. Based on these results, would probably need to be more aggressive with lipid management.

## 2017-12-21 ENCOUNTER — Telehealth: Payer: Self-pay | Admitting: Cardiology

## 2017-12-21 NOTE — Telephone Encounter (Signed)
New message  Pt verbalzied that she is calling for RN  She has some questions

## 2017-12-21 NOTE — Telephone Encounter (Signed)
Patient stated that she contacted insurance and they states that they have not received information in regards to ccta needing authorization. She was wondering whether she should pick medication from pharmacy.   RN informed patient to ask pharmacy to place mediation on file until needed. Not sure when pre cert will process order. Patient verbalized understanding and states she will call pharmacy.

## 2017-12-23 ENCOUNTER — Ambulatory Visit: Payer: PPO | Admitting: Nurse Practitioner

## 2018-01-04 DIAGNOSIS — Z01419 Encounter for gynecological examination (general) (routine) without abnormal findings: Secondary | ICD-10-CM | POA: Diagnosis not present

## 2018-01-04 DIAGNOSIS — Z1231 Encounter for screening mammogram for malignant neoplasm of breast: Secondary | ICD-10-CM | POA: Diagnosis not present

## 2018-01-04 DIAGNOSIS — Z6834 Body mass index (BMI) 34.0-34.9, adult: Secondary | ICD-10-CM | POA: Diagnosis not present

## 2018-01-18 DIAGNOSIS — R0609 Other forms of dyspnea: Secondary | ICD-10-CM | POA: Diagnosis not present

## 2018-01-18 DIAGNOSIS — Z01818 Encounter for other preprocedural examination: Secondary | ICD-10-CM | POA: Diagnosis not present

## 2018-01-18 DIAGNOSIS — Z8249 Family history of ischemic heart disease and other diseases of the circulatory system: Secondary | ICD-10-CM | POA: Diagnosis not present

## 2018-01-18 LAB — BASIC METABOLIC PANEL
BUN/Creatinine Ratio: 18 (ref 12–28)
BUN: 16 mg/dL (ref 8–27)
CALCIUM: 9.8 mg/dL (ref 8.7–10.3)
CO2: 27 mmol/L (ref 20–29)
CREATININE: 0.88 mg/dL (ref 0.57–1.00)
Chloride: 97 mmol/L (ref 96–106)
GFR calc Af Amer: 78 mL/min/{1.73_m2} (ref >59)
GFR calc non Af Amer: 68 mL/min/{1.73_m2} (ref >59)
Glucose: 87 mg/dL (ref 65–99)
POTASSIUM: 5.5 mmol/L — AB (ref 3.5–5.2)
Sodium: 139 mmol/L (ref 134–144)

## 2018-01-25 ENCOUNTER — Ambulatory Visit (HOSPITAL_COMMUNITY)
Admission: RE | Admit: 2018-01-25 | Discharge: 2018-01-25 | Disposition: A | Discharge status: Home / Self Care | Payer: PPO | Source: Ambulatory Visit | Attending: Cardiology | Admitting: Cardiology

## 2018-01-25 ENCOUNTER — Telehealth: Payer: Self-pay | Admitting: Cardiology

## 2018-01-25 ENCOUNTER — Encounter (HOSPITAL_COMMUNITY): Payer: Self-pay

## 2018-01-25 DIAGNOSIS — R0609 Other forms of dyspnea: Secondary | ICD-10-CM

## 2018-01-25 DIAGNOSIS — E875 Hyperkalemia: Secondary | ICD-10-CM

## 2018-01-25 DIAGNOSIS — I2510 Atherosclerotic heart disease of native coronary artery without angina pectoris: Secondary | ICD-10-CM

## 2018-01-25 DIAGNOSIS — Z8249 Family history of ischemic heart disease and other diseases of the circulatory system: Secondary | ICD-10-CM

## 2018-01-25 DIAGNOSIS — Z79899 Other long term (current) drug therapy: Secondary | ICD-10-CM

## 2018-01-25 MED ORDER — METOPROLOL TARTRATE 50 MG PO TABS: 50.0000 mg | ORAL_TABLET | Freq: Once | ORAL | 0 refills | Status: DC

## 2018-01-25 MED ORDER — PREDNISONE 50 MG PO TABS: ORAL_TABLET | ORAL | 0 refills | Status: DC

## 2018-01-25 NOTE — Telephone Encounter (Signed)
Spoke to patient.  Patient is aware . Patient  Had a question what dies antihistamine do th the testing ,if sh has to take medication. RN informed patient- per radiology ( [place])  Take benadryl one hour prior to testing time. ( will need a ride after test)    RN informed patient will contact her back with answer-

## 2018-01-25 NOTE — Progress Notes (Signed)
Pt rescheduled per CT

## 2018-01-25 NOTE — Telephone Encounter (Signed)
SPOKE WITH RADIOLOGY - CT  PATIENT unable to have  ccta today .  - 13 hour  Prophylaxis not completed . Need to reschedule

## 2018-01-25 NOTE — Progress Notes (Signed)
Pt here for CT Heart, contrast allergy listed, CT notified

## 2018-01-26 NOTE — Telephone Encounter (Signed)
Spoke to patient. RN inform patient - that antihistamine may increase the heart rate that is why it is recommend not to take at least 12 hours prior, but in her case she will need to take benadryl one hour prior to test due to allergies to IVP CONTRAST.   Patient also reviewed lab results , aware to  To increase hctz  2 days then recheck labs. Lab order placed. Patient voiced understanding.

## 2018-01-26 NOTE — Telephone Encounter (Signed)
-----   Message from Leonie Man, MD sent at 01/25/2018  2:55 PM EDT ----- Lab work for CT scan: Normal kidney function, but potassium is high.   Would actually recommend taking Full tablet of HCTZ for couple days and then recheck.  Otherwise okay for CT scan.  Glenetta Hew, MD

## 2018-01-28 DIAGNOSIS — E875 Hyperkalemia: Secondary | ICD-10-CM | POA: Diagnosis not present

## 2018-01-28 DIAGNOSIS — Z79899 Other long term (current) drug therapy: Secondary | ICD-10-CM | POA: Diagnosis not present

## 2018-01-28 LAB — BASIC METABOLIC PANEL
BUN/Creatinine Ratio: 25 (ref 12–28)
BUN: 20 mg/dL (ref 8–27)
CO2: 28 mmol/L (ref 20–29)
Calcium: 9.9 mg/dL (ref 8.7–10.3)
Chloride: 96 mmol/L (ref 96–106)
Creatinine, Ser: 0.81 mg/dL (ref 0.57–1.00)
GFR, EST AFRICAN AMERICAN: 86 mL/min/{1.73_m2} (ref >59)
GFR, EST NON AFRICAN AMERICAN: 75 mL/min/{1.73_m2} (ref >59)
Glucose: 96 mg/dL (ref 65–99)
Potassium: 5.2 mmol/L (ref 3.5–5.2)
Sodium: 139 mmol/L (ref 134–144)

## 2018-02-07 ENCOUNTER — Ambulatory Visit (HOSPITAL_COMMUNITY): Admission: RE | Admit: 2018-02-07 | Payer: PPO | Source: Ambulatory Visit

## 2018-02-07 ENCOUNTER — Encounter (HOSPITAL_COMMUNITY): Payer: Self-pay

## 2018-02-07 ENCOUNTER — Ambulatory Visit (HOSPITAL_COMMUNITY): Payer: PPO

## 2018-02-07 ENCOUNTER — Ambulatory Visit (HOSPITAL_COMMUNITY): Payer: PPO | Attending: Cardiology

## 2018-02-14 ENCOUNTER — Ambulatory Visit: Payer: PPO | Admitting: Cardiology

## 2018-02-14 DIAGNOSIS — I2510 Atherosclerotic heart disease of native coronary artery without angina pectoris: Secondary | ICD-10-CM

## 2018-02-14 HISTORY — DX: Atherosclerotic heart disease of native coronary artery without angina pectoris: I25.10

## 2018-02-18 ENCOUNTER — Ambulatory Visit (HOSPITAL_COMMUNITY)
Admission: RE | Admit: 2018-02-18 | Discharge: 2018-02-18 | Disposition: A | Discharge status: Home / Self Care | Payer: PPO | Source: Ambulatory Visit | Attending: Cardiology | Admitting: Cardiology

## 2018-02-18 DIAGNOSIS — I2510 Atherosclerotic heart disease of native coronary artery without angina pectoris: Secondary | ICD-10-CM | POA: Diagnosis not present

## 2018-02-18 DIAGNOSIS — Z8249 Family history of ischemic heart disease and other diseases of the circulatory system: Secondary | ICD-10-CM | POA: Diagnosis not present

## 2018-02-18 DIAGNOSIS — R0609 Other forms of dyspnea: Secondary | ICD-10-CM | POA: Insufficient documentation

## 2018-02-18 DIAGNOSIS — R0600 Dyspnea, unspecified: Secondary | ICD-10-CM | POA: Diagnosis not present

## 2018-02-18 MED ORDER — NITROGLYCERIN 0.4 MG SL SUBL: 0.8000 mg | SUBLINGUAL_TABLET | Freq: Once | SUBLINGUAL | Status: DC

## 2018-02-18 MED ORDER — IOPAMIDOL (ISOVUE-370) INJECTION 76%
INTRAVENOUS | Status: AC
  Administered 2018-02-18: 80 mL
  Filled 2018-02-18: qty 100

## 2018-02-18 MED ORDER — METOPROLOL TARTRATE 5 MG/5ML IV SOLN
5.0000 mg | Freq: Once | INTRAVENOUS | Status: AC
  Administered 2018-02-18: 5 mg via INTRAVENOUS
  Filled 2018-02-18: qty 5

## 2018-02-18 MED ORDER — METOPROLOL TARTRATE 5 MG/5ML IV SOLN
INTRAVENOUS | Status: AC
  Administered 2018-02-18: 5 mg via INTRAVENOUS
  Filled 2018-02-18: qty 5

## 2018-02-18 MED ORDER — NITROGLYCERIN 0.4 MG SL SUBL
0.4000 mg | SUBLINGUAL_TABLET | Freq: Once | SUBLINGUAL | Status: AC
  Administered 2018-02-18: 0.4 mg via SUBLINGUAL

## 2018-02-18 MED ORDER — NITROGLYCERIN 0.4 MG SL SUBL
SUBLINGUAL_TABLET | SUBLINGUAL | Status: AC
  Administered 2018-02-18: 0.4 mg via SUBLINGUAL
  Filled 2018-02-18: qty 2

## 2018-03-11 ENCOUNTER — Encounter: Payer: Self-pay | Admitting: Cardiology

## 2018-03-11 ENCOUNTER — Ambulatory Visit: Payer: PPO | Admitting: Cardiology

## 2018-03-11 DIAGNOSIS — I2510 Atherosclerotic heart disease of native coronary artery without angina pectoris: Secondary | ICD-10-CM | POA: Diagnosis not present

## 2018-03-11 DIAGNOSIS — R0609 Other forms of dyspnea: Secondary | ICD-10-CM

## 2018-03-11 DIAGNOSIS — E7849 Other hyperlipidemia: Secondary | ICD-10-CM | POA: Diagnosis not present

## 2018-03-11 NOTE — Progress Notes (Signed)
PCP: Kelton Pillar, MD  Clinic Note: Chief Complaint  Patient presents with  . Follow-up    test results; DOE    HPI: Annette [name] is a 67 y.o. female with a PMH below who presents today for Cardiology Evaluation 2/2 Coronary Calcification on Chest CT - seen @ the request of Kelton Pillar, MD. She is a former 38-pack-year smoker (quit 7 years ago).  BRIYONNA OMARA was seen on October 07, 2017 by Dr.Griffin to discuss the results of her lung CT scan -that amongst other findings indicated possible coronary artery calcification..  As a result, she is referred for cardiology evaluation based on the fact she is been noting some exertional dyspnea..  I saw her in April  Recent Hospitalizations: None  Studies Personally Reviewed - (if available, images/films reviewed: From Epic Chart or Care Everywhere)  CCor CTA -- 02/18/2018 1. Coronary calcium score of 79. This was 56 percentile for age and sex matched control.  2. Normal coronary origin with right dominance.  3. Mild CAD in the proximal LAD, proximal RCA and mid LCX arteries, moderate plaque in the ostial LCX artery. Aggressive risk factor modification is recommended.  4. Dilated pulmonary artery measuring 33 mm suggestive of pulmonary hypertension.  Interval History: [name] indicates that she still has some exertional dyspnea, but this really is no change for her.  She is trying to be consistent going to the gym, and really does not note DOE while exercising.  She gets SOB going up stairs or walking outside on the street.  NO chest pain either at rest or with exertion. No real resting dyspnea unless dealing with a URI. Chronic cough from long-time smoking.  Mild dependent edema, no Orthopnea or PND. NO claudication. No rapid/irregular heart beats or rhythm. NO syncope/near syncope, TIA/amaurosis fugax symptoms. No melena, hematochezia, hematuria, or epstaxis. No claudication.  She takes propranolol for familial tremor.  She uses  HCTZ treatments as needed for mild edema.  Neither is being used for hypertension   ROS: A comprehensive was performed. Review of Systems  Constitutional: Negative for malaise/fatigue.  HENT: Negative for nosebleeds.   Respiratory: Positive for shortness of breath (@ baseline). Negative for cough and wheezing.        Per HPI  Cardiovascular:       Per HPI  Gastrointestinal: Negative for blood in stool and melena.  Genitourinary: Negative for hematuria.  Musculoskeletal: Positive for joint pain (Mild arthritis pains).  Neurological: Negative for dizziness.  All other systems reviewed and are negative.  I have reviewed and (if needed) personally updated the patient's problem list, medications, allergies, past medical and surgical history, social and family history.   Past Medical History:  Diagnosis Date  . Arthritis    Right hip, end stage  . Difficulty sleeping    DUE TO PAIN  . Fluid retention    TAKES HCTZ  . Hemorrhoids   . Hypertension   . Hypothyroidism   . Irritable bowel syndrome   . Shingles 2004  . Tremors of nervous system    TAKES PROPRANOLOL TO TX  . Varicose veins     Past Surgical History:  Procedure Laterality Date  . ABDOMINAL HYSTERECTOMY  2000  . CATARACT EXTRACTION Right   . CHOLECYSTECTOMY  1994  . COLONOSCOPY    . ERCP  2004   sludge in CBD (jaundiced)  . ESOPHAGOGASTRODUODENOSCOPY    . INCISE AND DRAIN ABCESS Left 05/2016   wrist  . INCISION / DRAINAGE  HAND / FINGER     LEFT INDEX FINGER  . JOINT REPLACEMENT    . OVARIAN CYST REMOVAL  [id number]  . ROTATOR CUFF REPAIR     left  . TOTAL HIP ARTHROPLASTY  06/2010   right  . TOTAL HIP ARTHROPLASTY Left 03/22/2015   Procedure: LEFT TOTAL HIP ARTHROPLASTY ANTERIOR APPROACH;  Surgeon: Gaynelle Arabian, MD;  Location: [facility];  Service: Orthopedics;  Laterality: Left;    Current Meds  Medication Sig  . aspirin EC 81 MG tablet Take 81 mg by mouth daily.  . hydrochlorothiazide (MICROZIDE) 12.5 MG  capsule Take 12.5 mg by mouth as needed.   Marland Kitchen levothyroxine (SYNTHROID, LEVOTHROID) 100 MCG tablet take 1 tablet by mouth every morning ON AN EMPTY STOMACH  . propranolol ER (INDERAL LA) 60 MG 24 hr capsule Take 60 mg by mouth daily.  . valACYclovir (VALTREX) 500 MG tablet Take 500 mg by mouth daily.     Allergies  Allergen Reactions  . Augmentin [Amoxicillin-Pot Clavulanate] Nausea And Vomiting  . Codeine Other (See Comments)    Causes her to stay awake   . Other Hives    IV contrast   . Iodinated Diagnostic Agents Rash    Social History   Tobacco Use  . Smoking status: Former Smoker    Packs/day: 1.00    Years: 38.00    Pack years: 38.00    Last attempt to quit: 03/14/2009    Years since quitting: 9.0  . Smokeless tobacco: Never Used  Substance Use Topics  . Alcohol use: Yes    Comment: beer every 6-8 months  . Drug use: No   Social History   Social History Narrative   [employer]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 1 son   1 caffeine drink daily   Updated 06/01/2013    family history includes Alzheimer's disease in her father; Breast cancer in her sister; CAD (age of onset: 63) in her mother; Stroke in her mother.  Wt Readings from Last 3 Encounters:  03/11/18 219 lb (99.3 kg)  12/13/17 220 lb 12.8 oz (100.2 kg)  10/19/16 206 lb (93.4 kg)    PHYSICAL EXAM BP 126/78   Pulse 61   Ht 5\' 7"  (1.702 m)   Wt 219 lb (99.3 kg)   SpO2 97%   BMI 34.30 kg/m  Physical Exam  Constitutional: She is oriented to person, place, and time. She appears well-developed and well-nourished. No distress.  Moderately obese.  Otherwise healthy-appearing.  Well-groomed  HENT:  Head: Normocephalic and atraumatic.  Eyes: Pupils are equal, round, and reactive to light. Conjunctivae and EOM are normal.  Neck: Normal range of motion. Neck supple. No hepatojugular reflux and no JVD present. Carotid bruit is not present.  Cardiovascular: Normal rate, regular rhythm, normal heart sounds, intact  distal pulses and normal pulses.  No extrasystoles are present. PMI is not displaced (Difficult to palpate). Exam reveals no gallop and no friction rub.  No murmur heard. Pulmonary/Chest: Effort normal and breath sounds normal. No respiratory distress. She has no wheezes. She has no rales.  Abdominal: Soft. Bowel sounds are normal. She exhibits no distension. There is no tenderness. There is no rebound.  No HSM  Musculoskeletal: Normal range of motion. She exhibits no edema.  Neurological: She is alert and oriented to person, place, and time.  Psychiatric: She has a normal mood and affect. Her behavior is normal. Judgment and thought content normal.  Nursing note and vitals reviewed.   Adult  ECG Report - n/a    Other studies Reviewed: Additional studies/ records that were reviewed today include:  Recent Labs: October 19, 2017: TC 237, TG 130, LDL 159, HDL 53.  BUN/Cr 15/0.9.  TSH 4.17. 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  No evidence of significant stenosis.  Recommend aggressive risk factor modification including lipid, glycemic and blood pressure control. Smoking cessation congratulated - encouraged to remain abstinent.      DOE (dyspnea on exertion)    Suspect that this is more related to pulmonary issues & not CAD.      Hyperlipidemia due to dietary fat intake (Chronic)    With evidence of mild-moderate CAD on CT, recommendations would be LDL <70 to avoid progression of disease.  Not on statin (PCP had written for Atorvastatin, but she did not start).  I agree with recommendation to start statin, but would consider converting to rosuvastatin or atorvastatin. Agree with low dose ACE-I         Current medicines are reviewed at length with the patient today.  (+/- concerns) n/a The  following changes have been made:  n/a -- NEEDS TO CONSIDER STATIN Patient Instructions  Medication Instructions:  Your physician recommends that you continue on your current medications as directed. Please refer to the Current Medication list given to you today.  Labwork: When your PCP rechecks your TSH, verify they check cholesterol as well  Follow-Up: Your physician wants you to follow-up in: 1 year with Dr. Ellyn Hack.  You will receive a reminder letter in the mail two months in advance. If you don't receive a letter, please call our office to schedule the follow-up appointment.   Any Other Special Instructions Will Be Listed Below (If Applicable).     If you need a refill on your cardiac medications before your next appointment, please call your pharmacy.    Studies Ordered:   No orders of the defined types were placed in this encounter.     Glenetta Hew, M.D., M.S. Interventional Cardiologist   Pager # [phone] Phone # [phone] [address] [place], [place] [address]   Thank you for choosing [facility] at [facility]!!

## 2018-03-11 NOTE — Patient Instructions (Signed)
Medication Instructions:  Your physician recommends that you continue on your current medications as directed. Please refer to the Current Medication list given to you today.  Labwork: When your PCP rechecks your TSH, verify they check cholesterol as well  Follow-Up: Your physician wants you to follow-up in: 1 year with Dr. Ellyn Hack.  You will receive a reminder letter in the mail two months in advance. If you don't receive a letter, please call our office to schedule the follow-up appointment.   Any Other Special Instructions Will Be Listed Below (If Applicable).     If you need a refill on your cardiac medications before your next appointment, please call your pharmacy.

## 2018-03-14 ENCOUNTER — Encounter: Payer: Self-pay | Admitting: Cardiology

## 2018-03-14 NOTE — Assessment & Plan Note (Signed)
With evidence of mild-moderate CAD on CT, recommendations would be LDL <70 to avoid progression of disease.  Not on statin (PCP had written for Atorvastatin, but she did not start).  I agree with recommendation to start statin, but would consider converting to rosuvastatin or atorvastatin. Agree with low dose ACE-I

## 2018-03-14 NOTE — Assessment & Plan Note (Signed)
Suspect that this is more related to pulmonary issues & not CAD.

## 2018-03-14 NOTE — Assessment & Plan Note (Signed)
Point calcium on CT scan and noted mild CAD in all major arteries.  No evidence of significant stenosis.  Recommend aggressive risk factor modification including lipid, glycemic and blood pressure control. Smoking cessation congratulated - encouraged to remain abstinent.

## 2018-04-15 ENCOUNTER — Ambulatory Visit (INDEPENDENT_AMBULATORY_CARE_PROVIDER_SITE_OTHER)
Admission: RE | Admit: 2018-04-15 | Discharge: 2018-04-15 | Disposition: A | Discharge status: Home / Self Care | Payer: PPO | Source: Ambulatory Visit | Attending: Acute Care | Admitting: Acute Care

## 2018-04-15 ENCOUNTER — Inpatient Hospital Stay: Admission: RE | Admit: 2018-04-15 | Payer: PPO | Source: Ambulatory Visit

## 2018-04-15 DIAGNOSIS — Z122 Encounter for screening for malignant neoplasm of respiratory organs: Secondary | ICD-10-CM

## 2018-04-15 DIAGNOSIS — Z87891 Personal history of nicotine dependence: Secondary | ICD-10-CM

## 2018-04-22 ENCOUNTER — Other Ambulatory Visit: Payer: Self-pay | Admitting: Acute Care

## 2018-04-22 DIAGNOSIS — Z122 Encounter for screening for malignant neoplasm of respiratory organs: Secondary | ICD-10-CM

## 2018-04-22 DIAGNOSIS — Z87891 Personal history of nicotine dependence: Secondary | ICD-10-CM

## 2018-04-27 DIAGNOSIS — E039 Hypothyroidism, unspecified: Secondary | ICD-10-CM | POA: Diagnosis not present

## 2018-04-27 DIAGNOSIS — E7800 Pure hypercholesterolemia, unspecified: Secondary | ICD-10-CM | POA: Diagnosis not present

## 2018-05-18 ENCOUNTER — Telehealth: Payer: Self-pay | Admitting: Internal Medicine

## 2018-05-18 NOTE — Telephone Encounter (Signed)
Spoke with pt, she will have records faxed over for review.

## 2018-05-18 NOTE — Telephone Encounter (Signed)
Pt called looking to schedule a colonoscopy with Dr. Carlean Purl, she stated that she is 2 or 3 years overdue, she did not remember when she had her last one, just said that it was with Dr. Collene Mares. Pls advise if it is ok to direct book.

## 2018-05-18 NOTE — Telephone Encounter (Signed)
She will need to get records for his review? There are no recent procedures in her chart.  She may not be due.  Need the last procedures and path, if any

## 2018-06-07 DIAGNOSIS — L821 Other seborrheic keratosis: Secondary | ICD-10-CM | POA: Diagnosis not present

## 2018-06-07 DIAGNOSIS — D2239 Melanocytic nevi of other parts of face: Secondary | ICD-10-CM | POA: Diagnosis not present

## 2018-06-07 DIAGNOSIS — L738 Other specified follicular disorders: Secondary | ICD-10-CM | POA: Diagnosis not present

## 2018-07-22 IMAGING — CT CT HEART MORP W/ CTA COR W/ SCORE W/ CA W/CM &/OR W/O CM
4 of 7 series · 8 of 20 positions shown, 9 images · IV contrast (APPLIED)
Comparison: 04/14/2017

CLINICAL DATA: 66-year-old female with h/o hyperlipidemia, smoking,
FH of premature CAD, coronary calcifications on the chest CTA in
[DATE] and BLANCA ESTER.

EXAM:
Cardiac/Coronary  CT
TECHNIQUE: The patient was scanned on a [employer] Force scanner.

[Series 6: best diast 70 % · axial · 0.39mm/px · z∈[+1214,+1265]mm · 2 of 378 slices shown, 3 images]
[im 126/378  vessel]
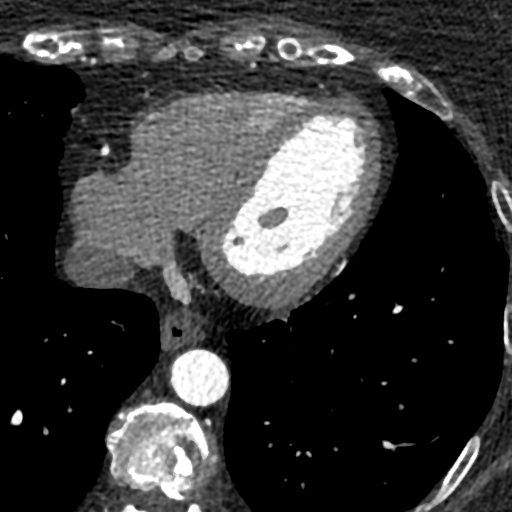
[im 126/378  lung]
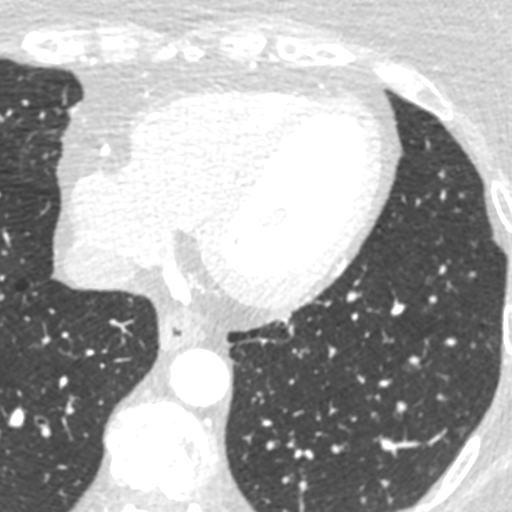
[im 252/378  vessel]
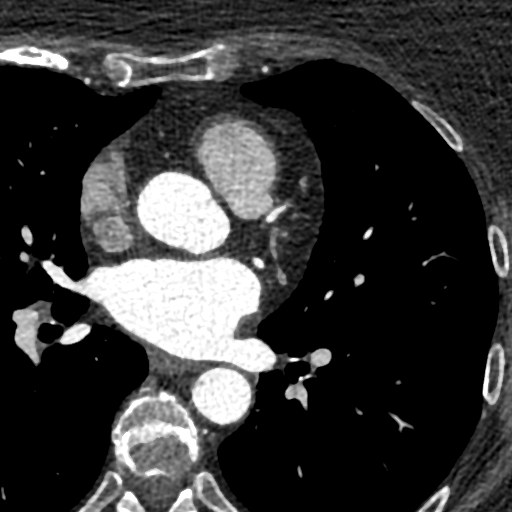

[Series 7: best syst 40 % · axial · 0.39mm/px · z∈[+1214,+1265]mm · 2 of 378 slices shown]
[im 126/378  vessel]
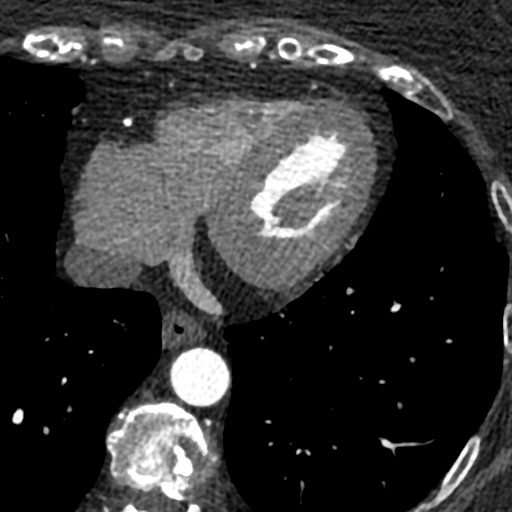
[im 252/378  vessel]
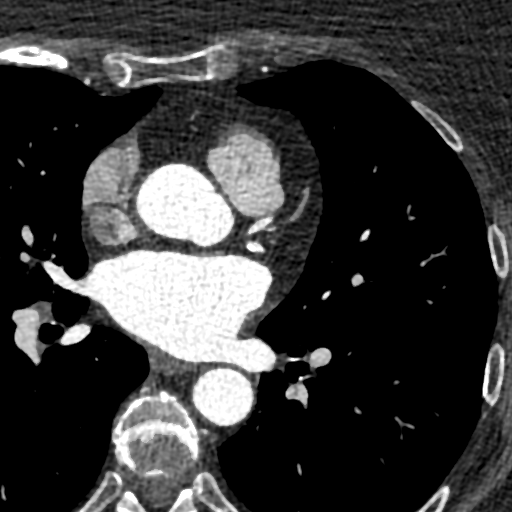

[Series 8: ts diast sharp 70 % · axial · 0.39mm/px · z∈[+1214,+1265]mm · 2 of 378 slices shown]
[im 126/378  lung]
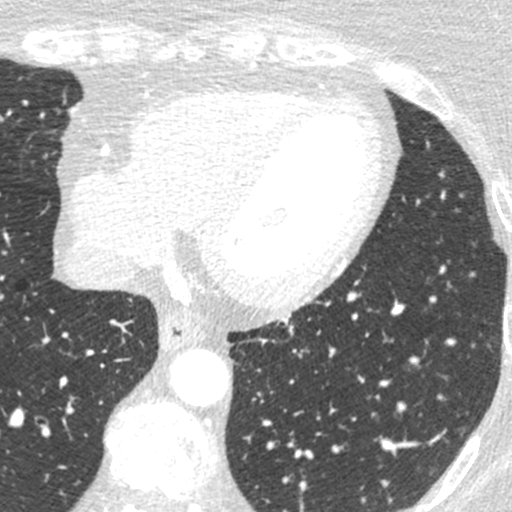
[im 252/378  lung]
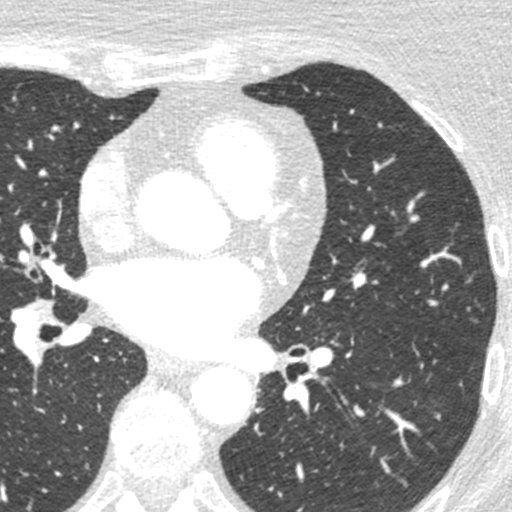

[Series 9: ts syst sharp 40 % · axial · 0.39mm/px · z∈[+1214,+1265]mm · 2 of 378 slices shown]
[im 126/378  lung]
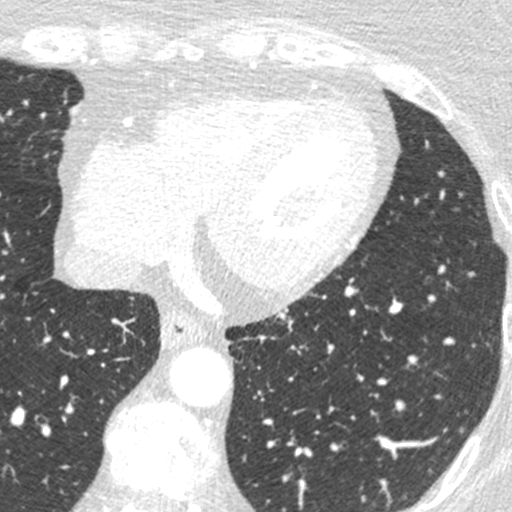
[im 252/378  lung]
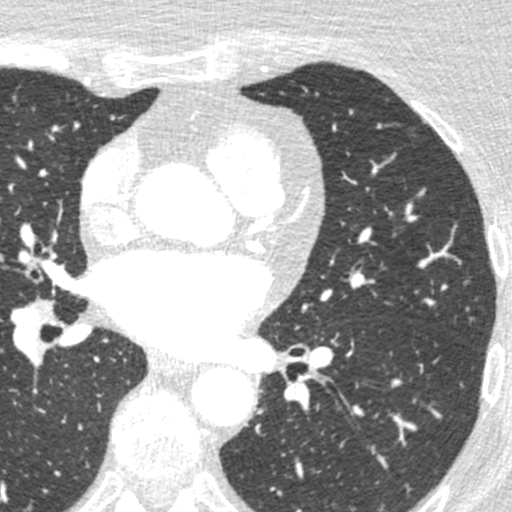

[8 of 20 positions shown; findings below may reference images not displayed]



Aorta: Normal size. Mild diffuse atheroma and calcifications. No
dissection.

Aortic Valve:  Trileaflet.  No calcifications.

Coronary Arteries:  Normal coronary origin.  Right dominance.

RCA is a large dominant artery that gives rise to PDA and PLVB.
There is mild calcified plaque in the proximal RCA with associated
stenosis 25-50%.

Left main is a large artery that gives rise to LAD and LCX arteries.
Left main has a trivial calcified plaque at its ostium.

LAD is a large vessel that gives rise to two diagonal arteries.
There is mild mixed plaque in the proximal to mid LAD with
associated stenosis 25-50%.

D1 is very small, D2 is a medium size vessel, there is no plaque.

LCX is a non-dominant artery that gives rise to one large OM1
branch. There moderate calcified plaque in the ostial LCX artery
with associated stenosis 25-50% and possibly 50-69% stenosis, mid
LCX artery has mild calcified plaque with stenosis 25-50%.

OM1 is large and has minimal plaque.

Other findings:

Normal pulmonary vein drainage into the left atrium.

Normal let atrial appendage without a thrombus.

Normal size of the pulmonary artery.
IMPRESSION: 1. Coronary calcium score of 79. This was 73 percentile for age and
sex matched control.

2. Normal coronary origin with right dominance.

3. Mild CAD in the proximal LAD, proximal RCA and mid LCX arteries,
moderate plaque in the ostial LCX artery. Aggressive risk factor
modification is recommended.

4. Dilated pulmonary artery measuring 33 mm suggestive of pulmonary
hypertension.

EXAM:
OVER-READ INTERPRETATION  CT CHEST

The following report is an over-read performed by radiologist Dr.
Angela Johana Quindio [[email or web address]] on 02/18/2018. This over-read
does not include interpretation of cardiac or coronary anatomy or
pathology. The coronary CTA interpretation by the cardiologist is
attached.
FINDINGS: Vascular: Heart is normal size. Aorta is normal caliber.

Mediastinum/Nodes: No adenopathy in the lower mediastinum or hila.

Lungs/Pleura: Visualized lungs clear.  No effusions.

Upper Abdomen: Imaging into the upper abdomen shows no acute
findings.

Musculoskeletal: Chest wall soft tissues are unremarkable. No acute
bony abnormality.
IMPRESSION: No acute or significant extracardiac abnormality.

## 2018-10-27 DIAGNOSIS — J309 Allergic rhinitis, unspecified: Secondary | ICD-10-CM | POA: Diagnosis not present

## 2018-10-27 DIAGNOSIS — E7800 Pure hypercholesterolemia, unspecified: Secondary | ICD-10-CM | POA: Diagnosis not present

## 2018-10-27 DIAGNOSIS — M1990 Unspecified osteoarthritis, unspecified site: Secondary | ICD-10-CM | POA: Diagnosis not present

## 2018-10-27 DIAGNOSIS — I2510 Atherosclerotic heart disease of native coronary artery without angina pectoris: Secondary | ICD-10-CM | POA: Diagnosis not present

## 2018-10-27 DIAGNOSIS — E039 Hypothyroidism, unspecified: Secondary | ICD-10-CM | POA: Diagnosis not present

## 2018-10-27 DIAGNOSIS — R251 Tremor, unspecified: Secondary | ICD-10-CM | POA: Diagnosis not present

## 2018-10-27 DIAGNOSIS — M8588 Other specified disorders of bone density and structure, other site: Secondary | ICD-10-CM | POA: Diagnosis not present

## 2018-10-27 DIAGNOSIS — I10 Essential (primary) hypertension: Secondary | ICD-10-CM | POA: Diagnosis not present

## 2018-10-27 DIAGNOSIS — Z0000 Encounter for general adult medical examination without abnormal findings: Secondary | ICD-10-CM | POA: Diagnosis not present

## 2018-10-28 DIAGNOSIS — L820 Inflamed seborrheic keratosis: Secondary | ICD-10-CM | POA: Diagnosis not present

## 2018-10-28 DIAGNOSIS — D1801 Hemangioma of skin and subcutaneous tissue: Secondary | ICD-10-CM | POA: Diagnosis not present

## 2018-10-28 DIAGNOSIS — L821 Other seborrheic keratosis: Secondary | ICD-10-CM | POA: Diagnosis not present

## 2018-10-28 DIAGNOSIS — L301 Dyshidrosis [pompholyx]: Secondary | ICD-10-CM | POA: Diagnosis not present

## 2019-02-22 DIAGNOSIS — H25012 Cortical age-related cataract, left eye: Secondary | ICD-10-CM | POA: Diagnosis not present

## 2019-02-22 DIAGNOSIS — Z961 Presence of intraocular lens: Secondary | ICD-10-CM | POA: Diagnosis not present

## 2019-02-22 DIAGNOSIS — H2512 Age-related nuclear cataract, left eye: Secondary | ICD-10-CM | POA: Diagnosis not present

## 2019-02-22 DIAGNOSIS — H35013 Changes in retinal vascular appearance, bilateral: Secondary | ICD-10-CM | POA: Diagnosis not present

## 2019-02-28 DIAGNOSIS — D225 Melanocytic nevi of trunk: Secondary | ICD-10-CM | POA: Diagnosis not present

## 2019-02-28 DIAGNOSIS — L820 Inflamed seborrheic keratosis: Secondary | ICD-10-CM | POA: Diagnosis not present

## 2019-02-28 DIAGNOSIS — L304 Erythema intertrigo: Secondary | ICD-10-CM | POA: Diagnosis not present

## 2019-04-03 DIAGNOSIS — L700 Acne vulgaris: Secondary | ICD-10-CM | POA: Diagnosis not present

## 2019-04-03 DIAGNOSIS — D485 Neoplasm of uncertain behavior of skin: Secondary | ICD-10-CM | POA: Diagnosis not present

## 2019-04-03 DIAGNOSIS — L820 Inflamed seborrheic keratosis: Secondary | ICD-10-CM | POA: Diagnosis not present

## 2019-04-14 DIAGNOSIS — H00022 Hordeolum internum right lower eyelid: Secondary | ICD-10-CM | POA: Diagnosis not present

## 2019-04-26 DIAGNOSIS — L821 Other seborrheic keratosis: Secondary | ICD-10-CM | POA: Diagnosis not present

## 2019-04-26 DIAGNOSIS — L570 Actinic keratosis: Secondary | ICD-10-CM | POA: Diagnosis not present

## 2019-04-26 DIAGNOSIS — D485 Neoplasm of uncertain behavior of skin: Secondary | ICD-10-CM | POA: Diagnosis not present

## 2019-04-26 DIAGNOSIS — L820 Inflamed seborrheic keratosis: Secondary | ICD-10-CM | POA: Diagnosis not present

## 2019-05-02 DIAGNOSIS — R609 Edema, unspecified: Secondary | ICD-10-CM | POA: Diagnosis not present

## 2019-05-02 DIAGNOSIS — I10 Essential (primary) hypertension: Secondary | ICD-10-CM | POA: Diagnosis not present

## 2019-05-02 DIAGNOSIS — I2510 Atherosclerotic heart disease of native coronary artery without angina pectoris: Secondary | ICD-10-CM | POA: Diagnosis not present

## 2019-05-02 DIAGNOSIS — G4700 Insomnia, unspecified: Secondary | ICD-10-CM | POA: Diagnosis not present

## 2019-05-02 DIAGNOSIS — E039 Hypothyroidism, unspecified: Secondary | ICD-10-CM | POA: Diagnosis not present

## 2019-05-02 DIAGNOSIS — E7800 Pure hypercholesterolemia, unspecified: Secondary | ICD-10-CM | POA: Diagnosis not present

## 2019-05-03 DIAGNOSIS — Z23 Encounter for immunization: Secondary | ICD-10-CM | POA: Diagnosis not present

## 2019-05-03 DIAGNOSIS — E039 Hypothyroidism, unspecified: Secondary | ICD-10-CM | POA: Diagnosis not present

## 2019-05-03 DIAGNOSIS — E7800 Pure hypercholesterolemia, unspecified: Secondary | ICD-10-CM | POA: Diagnosis not present

## 2019-05-10 ENCOUNTER — Ambulatory Visit (INDEPENDENT_AMBULATORY_CARE_PROVIDER_SITE_OTHER)
Admission: RE | Admit: 2019-05-10 | Discharge: 2019-05-10 | Disposition: A | Discharge status: Home / Self Care | Payer: PPO | Source: Ambulatory Visit | Attending: Acute Care | Admitting: Acute Care

## 2019-05-10 ENCOUNTER — Other Ambulatory Visit: Payer: Self-pay

## 2019-05-10 DIAGNOSIS — Z122 Encounter for screening for malignant neoplasm of respiratory organs: Secondary | ICD-10-CM

## 2019-05-10 DIAGNOSIS — Z87891 Personal history of nicotine dependence: Secondary | ICD-10-CM

## 2019-05-11 ENCOUNTER — Telehealth: Payer: Self-pay | Admitting: Acute Care

## 2019-05-11 DIAGNOSIS — Z87891 Personal history of nicotine dependence: Secondary | ICD-10-CM

## 2019-05-11 DIAGNOSIS — Z122 Encounter for screening for malignant neoplasm of respiratory organs: Secondary | ICD-10-CM

## 2019-05-11 NOTE — Telephone Encounter (Signed)
Pt informed of CT results per Sarah Groce, NP.  PT verbalized understanding.  Copy sent to PCP.  Order placed for 1 yr f/u CT.  

## 2019-05-23 ENCOUNTER — Encounter: Payer: Self-pay | Admitting: Cardiology

## 2019-05-23 ENCOUNTER — Telehealth (INDEPENDENT_AMBULATORY_CARE_PROVIDER_SITE_OTHER): Payer: PPO | Admitting: Cardiology

## 2019-05-23 DIAGNOSIS — Z79899 Other long term (current) drug therapy: Secondary | ICD-10-CM | POA: Diagnosis not present

## 2019-05-23 DIAGNOSIS — E7849 Other hyperlipidemia: Secondary | ICD-10-CM | POA: Diagnosis not present

## 2019-05-23 DIAGNOSIS — R0600 Dyspnea, unspecified: Secondary | ICD-10-CM | POA: Diagnosis not present

## 2019-05-23 DIAGNOSIS — I2510 Atherosclerotic heart disease of native coronary artery without angina pectoris: Secondary | ICD-10-CM

## 2019-05-23 DIAGNOSIS — R601 Generalized edema: Secondary | ICD-10-CM | POA: Diagnosis not present

## 2019-05-23 DIAGNOSIS — R0609 Other forms of dyspnea: Secondary | ICD-10-CM

## 2019-05-23 MED ORDER — HYDROCHLOROTHIAZIDE 25 MG PO TABS: 25.0000 mg | ORAL_TABLET | Freq: Every day | ORAL | 11 refills | Status: DC

## 2019-05-23 NOTE — Progress Notes (Signed)
Virtual Visit via Video Note   This visit type was conducted due to national recommendations for restrictions regarding the COVID-19 Pandemic (e.g. social distancing) in an effort to limit this patient's exposure and mitigate transmission in our community.  Due to her co-morbid illnesses, this patient is at least at moderate risk for complications without adequate follow up.  This format is felt to be most appropriate for this patient at this time.  All issues noted in this document were discussed and addressed.  A limited physical exam was performed with this format.  Please refer to the patient's chart for her consent to telehealth for [facility].   Patient has given verbal permission to conduct this visit via virtual appointment and to bill insurance 05/23/2019 5:14 PM     Evaluation Performed:  Follow-up visit  Date:  05/23/2019   ID:  Annette, [name] March 31, 1951, MRN [id number]  Patient Location: Home Provider Location: Other:  Hospital office  PCP:  Kelton Pillar, MD  Cardiologist:  No primary care provider on file.  Electrophysiologist:  None   Chief Complaint: Annual follow-up, exertional dyspnea, swelling  History of Present Illness:    Annette [name] is a 68 y.o. female who presents via audio/video conferencing for a telehealth visit today to for annual f/u .  Annette [name] was last seen on March 11, 2018 to follow-up the results of her coronary calcium score/coronary CTA   Recent CV studies:   The following studies were reviewed today: . CT Coronary Angiogram July 2019 reviewed in [place]   Interval History:    Annette [name] is being seen today, stating that she "has done better ".  She says that over the last couple months she has been having generalized edema described as swelling in her hands, feet and face.  She also says that she has been having more than usual exertional dyspnea.  She says that she had been doing a really good job with walking, up to 2 to 3 miles at a  time and doing well, but over the last month or so she has been noticing that she is not able to do as much and now is to the point where she gets short of breath doing just routine activities.  She is additionally noted that her blood pressures have been up a lot lately ranging in the 170/80-180/90 mmHg range.  With this she has had some headaches and dizziness, but no blurred vision or syncope/near syncope.  She has some tightness in her chest when she is getting short of breath with exertion, but not otherwise.  Her PCP gave her 20 pills of HCTZ and she immediately felt better with reduced edema, lower blood pressures and less dyspnea while taking the the HCTZ.   Remainder of cardiovascular review of symptoms: positive for - chest pain, dyspnea on exertion and edema negative for - irregular heartbeat, orthopnea, palpitations, paroxysmal nocturnal dyspnea, rapid heart rate or Syncope/near syncope, TIA/amaurosis fugax, claudication  The patient does not have symptoms concerning for COVID-19 infection (fever, chills, cough, or new shortness of breath).  Besides the dyspnea that she notes above The patient is practicing social distancing.  ROS:  Please see the history of present illness.    Review of Systems  Constitutional: Positive for malaise/fatigue.  HENT: Negative for nosebleeds.   Respiratory: Positive for shortness of breath.   Gastrointestinal: Negative for abdominal pain, blood in stool, heartburn and melena.  Genitourinary: Negative for hematuria.  Musculoskeletal: Negative for  falls and joint pain.  Neurological: Positive for headaches. Negative for dizziness.  Psychiatric/Behavioral: Negative.   All other systems reviewed and are negative.   Past Medical History:  Diagnosis Date  . Arthritis    Right hip, end stage  . Coronary artery calcification seen on computed tomography 02/2018   Coronary calcium score 79.  Coronary CT angiogram: Mild CAD and proximal LAD, proximal RCA  and mid LCx.  Moderate plaque ostial LCx.  Mildly dilated pulmonary artery, suggestive of possible pulmonary hypertension  . Difficulty sleeping    DUE TO PAIN  . Fluid retention    TAKES HCTZ  . Hemorrhoids   . Hypertension   . Hypothyroidism   . Irritable bowel syndrome   . Shingles 2004  . Tremors of nervous system    TAKES PROPRANOLOL TO TX  . Varicose veins    Past Surgical History:  Procedure Laterality Date  . ABDOMINAL HYSTERECTOMY  2000  . CATARACT EXTRACTION Right   . CHOLECYSTECTOMY  1994  . COLONOSCOPY    . ERCP  2004   sludge in CBD (jaundiced)  . ESOPHAGOGASTRODUODENOSCOPY    . INCISE AND DRAIN ABCESS Left 05/2016   wrist  . INCISION / DRAINAGE HAND / FINGER     LEFT INDEX FINGER  . JOINT REPLACEMENT    . OVARIAN CYST REMOVAL  [id number]  . ROTATOR CUFF REPAIR     left  . TOTAL HIP ARTHROPLASTY  06/2010   right  . TOTAL HIP ARTHROPLASTY Left 03/22/2015   Procedure: LEFT TOTAL HIP ARTHROPLASTY ANTERIOR APPROACH;  Surgeon: Gaynelle Arabian, MD;  Location: [facility];  Service: Orthopedics;  Laterality: Left;     Current Meds  Medication Sig  . aspirin EC 81 MG tablet Take 81 mg by mouth daily.  . Cholecalciferol (KP VITAMIN D3) 50 MCG (2000 UT) CAPS Take by mouth.  . levothyroxine (SYNTHROID, LEVOTHROID) 100 MCG tablet take 1 tablet by mouth every morning ON AN EMPTY STOMACH  . Multiple Vitamin (MULTIVITAMIN) tablet Take 1 tablet by mouth daily.  . propranolol ER (INDERAL LA) 60 MG 24 hr capsule Take 60 mg by mouth daily.  . rosuvastatin (CRESTOR) 5 MG tablet Take 5 mg by mouth daily.     Allergies:   Augmentin [amoxicillin-pot clavulanate], Codeine, Other, and Iodinated diagnostic agents   Social History   Tobacco Use  . Smoking status: Former Smoker    Packs/day: 1.00    Years: 38.00    Pack years: 38.00    Quit date: 03/14/2009    Years since quitting: 10.1  . Smokeless tobacco: Never Used  Substance Use Topics  . Alcohol use: Yes    Comment: beer  every 6-8 months  . Drug use: No     Family Hx: The patient's family history includes Alzheimer's disease in her father; Breast cancer in her sister; CAD (age of onset: 86) in her mother; Stroke in her mother.   Labs/Other Tests and Data Reviewed:    EKG:  No ECG reviewed.  Recent Labs: No results found for requested labs within last 8760 hours.   Recent Lipid Panel No results found for: CHOL, TRIG, HDL, CHOLHDL, LDLCALC, LDLDIRECT From PCP in June TC 177, TG 125, LDL 94, HDL 58.  Wt Readings from Last 3 Encounters:  03/11/18 219 lb (99.3 kg)  12/13/17 220 lb 12.8 oz (100.2 kg)  10/19/16 206 lb (93.4 kg)     Objective:    Vital Signs:  There were no vitals taken  for this visit.  Well nourished, well developed female in no acute distress.  Well-groomed. A&O x 3.  Normal Mood & Affect Non-labored respirations   ASSESSMENT & PLAN:    Problem List Items Addressed This Visit    Coronary artery calcification seen on computed tomography (Chronic)    As recommended originally, with evidence of at least moderate disease in the circumflex, aggressive restart medication is needed.  This includes continued smoking cessation as well as blood pressure and lipid control.  Unlikely that her dyspnea exertion is related to progression of disease since her lipids have improved, however if she is not feeling better after starting HCTZ and getting blood pressure better controlled, would potentially consider stress test of some sort.      Relevant Medications   rosuvastatin (CRESTOR) 5 MG tablet   hydrochlorothiazide (HYDRODIURIL) 25 MG tablet   Other Relevant Orders   ECHOCARDIOGRAM COMPLETE   Basic metabolic panel   Brain natriuretic peptide   Hyperlipidemia due to dietary fat intake (Chronic)    With her level of coronary artery calcification and coronary disease noted on CT angiogram, we talked about potentially considering statin.  Was started on 5 mg of rosuvastatin.  Thankfully,  with this, her lipids have improved.  She is tolerating 5 mg of rosuvastatin well.  If there is no further improvement, would probably increase dose.  Previous noted indicated low-dose ACE inhibitor, was actually meant to say low-dose aspirin.  Can discuss at follow-up      Relevant Medications   rosuvastatin (CRESTOR) 5 MG tablet   hydrochlorothiazide (HYDRODIURIL) 25 MG tablet   Other Relevant Orders   Basic metabolic panel   Brain natriuretic peptide   DOE (dyspnea on exertion) - Primary    Exertional dyspnea noted with some swelling that seems to be nonpitting and more related to volume overload.  Also with more increased blood pressure.  Plan: We will start HCTZ 25 mg p.o. daily as a standing med.  Her PCP labs showed K+ level of 5.5 with a creatinine of 0.9.  We will recheck chemistry panel in 2 weeks at which time we will check a 2D echo.  Suggestion of pulmonary HTN on CTA-chest, can evaluate pulmonary pressures on echo.  Reassess in a month after starting blood pressure control.  If symptoms are still persistent, can then consider stress testing.      Relevant Orders   ECHOCARDIOGRAM COMPLETE   Basic metabolic panel   Brain natriuretic peptide   Generalized edema    She describes nonpitting edema which is both pedal, and in the hands as well as face.  This type of swelling seems to be probably more related to a noncardiac etiology especially in the absence of orthopnea and PND.  She felt better after being started back on HCTZ.  I will simply have her take HCTZ daily since her blood pressures been so high. Would be unusual as a cardiac etiology to have swelling without PND and orthopnea unless it could be related to elevated pulmonary pressures (coronary CTA suggested mild potential pulmonary hypertension). Plan: Check 2D echo      Relevant Orders   ECHOCARDIOGRAM COMPLETE   Basic metabolic panel   Brain natriuretic peptide    Other Visit Diagnoses    Medication  management       Relevant Orders   Basic metabolic panel   Brain natriuretic peptide      COVID-19 Education: The signs and symptoms of COVID-19 were discussed with the  patient and how to seek care for testing (follow up with PCP or arrange E-visit).   The importance of social distancing was discussed today.  Time:   Today, I have spent 22 minutes with the patient with telehealth technology discussing the above problems.     Medication Adjustments/Labs and Tests Ordered: Current medicines are reviewed at length with the patient today.  Concerns regarding medicines are outlined above.  Medication Instructions:  Regarding go ahead and start HCTZ 25 mg daily  Tests Ordered: Orders Placed This Encounter  Procedures  . Basic metabolic panel  . Brain natriuretic peptide  . ECHOCARDIOGRAM COMPLETE  2D echocardiogram -in roughly 2 weeks Check BMP and BNP when you come in for the echocardiogram  Medication Changes: Meds ordered this encounter  Medications  . hydrochlorothiazide (HYDRODIURIL) 25 MG tablet    Sig: Take 1 tablet (25 mg total) by mouth daily.    Dispense:  30 tablet    Refill:  11   Disposition:  Follow up in 1 month(s) -in person    Signed, Glenetta Hew, MD  05/23/2019 5:14 PM    [place]

## 2019-05-23 NOTE — Assessment & Plan Note (Signed)
Exertional dyspnea noted with some swelling that seems to be nonpitting and more related to volume overload.  Also with more increased blood pressure.  Plan: We will start HCTZ 25 mg p.o. daily as a standing med.  Her PCP labs showed K+ level of 5.5 with a creatinine of 0.9.  We will recheck chemistry panel in 2 weeks at which time we will check a 2D echo.  Suggestion of pulmonary HTN on CTA-chest, can evaluate pulmonary pressures on echo.  Reassess in a month after starting blood pressure control.  If symptoms are still persistent, can then consider stress testing.

## 2019-05-23 NOTE — Assessment & Plan Note (Signed)
With her level of coronary artery calcification and coronary disease noted on CT angiogram, we talked about potentially considering statin.  Was started on 5 mg of rosuvastatin.  Thankfully, with this, her lipids have improved.  She is tolerating 5 mg of rosuvastatin well.  If there is no further improvement, would probably increase dose.  Previous noted indicated low-dose ACE inhibitor, was actually meant to say low-dose aspirin.  Can discuss at follow-up

## 2019-05-23 NOTE — Assessment & Plan Note (Signed)
She describes nonpitting edema which is both pedal, and in the hands as well as face.  This type of swelling seems to be probably more related to a noncardiac etiology especially in the absence of orthopnea and PND.  She felt better after being started back on HCTZ.  I will simply have her take HCTZ daily since her blood pressures been so high. Would be unusual as a cardiac etiology to have swelling without PND and orthopnea unless it could be related to elevated pulmonary pressures (coronary CTA suggested mild potential pulmonary hypertension). Plan: Check 2D echo

## 2019-05-23 NOTE — Assessment & Plan Note (Signed)
As recommended originally, with evidence of at least moderate disease in the circumflex, aggressive restart medication is needed.  This includes continued smoking cessation as well as blood pressure and lipid control.  Unlikely that her dyspnea exertion is related to progression of disease since her lipids have improved, however if she is not feeling better after starting HCTZ and getting blood pressure better controlled, would potentially consider stress test of some sort.

## 2019-05-23 NOTE — Patient Instructions (Addendum)
Medication Instructions:  Start HCTZ 25 mg daily  Tests Ordered: 2D echocardiogram -in roughly 2 weeks Check BMP and BNP when you come in for the echocardiogram  Medication Changes: HCTZ 25 mg p.o. daily.  Dispense #30, 11 refills  (if you do okay on this, we can switch to 90 days)   Disposition:  Follow up in 1 month(s) -in person

## 2019-05-25 DIAGNOSIS — L989 Disorder of the skin and subcutaneous tissue, unspecified: Secondary | ICD-10-CM | POA: Diagnosis not present

## 2019-05-25 DIAGNOSIS — D485 Neoplasm of uncertain behavior of skin: Secondary | ICD-10-CM | POA: Diagnosis not present

## 2019-05-31 ENCOUNTER — Ambulatory Visit (HOSPITAL_COMMUNITY): Payer: PPO | Attending: Internal Medicine

## 2019-05-31 ENCOUNTER — Other Ambulatory Visit: Payer: Self-pay

## 2019-05-31 DIAGNOSIS — I2510 Atherosclerotic heart disease of native coronary artery without angina pectoris: Secondary | ICD-10-CM | POA: Insufficient documentation

## 2019-05-31 DIAGNOSIS — R0600 Dyspnea, unspecified: Secondary | ICD-10-CM | POA: Diagnosis not present

## 2019-05-31 DIAGNOSIS — R601 Generalized edema: Secondary | ICD-10-CM | POA: Diagnosis not present

## 2019-05-31 DIAGNOSIS — R0609 Other forms of dyspnea: Secondary | ICD-10-CM

## 2019-05-31 DIAGNOSIS — E7849 Other hyperlipidemia: Secondary | ICD-10-CM | POA: Diagnosis not present

## 2019-05-31 DIAGNOSIS — Z79899 Other long term (current) drug therapy: Secondary | ICD-10-CM | POA: Diagnosis not present

## 2019-05-31 HISTORY — PX: TRANSTHORACIC ECHOCARDIOGRAM: SHX275

## 2019-06-01 LAB — BASIC METABOLIC PANEL
BUN/Creatinine Ratio: 23 (ref 12–28)
BUN: 19 mg/dL (ref 8–27)
CO2: 30 mmol/L — ABNORMAL HIGH (ref 20–29)
Calcium: 9.8 mg/dL (ref 8.7–10.3)
Chloride: 95 mmol/L — ABNORMAL LOW (ref 96–106)
Creatinine, Ser: 0.81 mg/dL (ref 0.57–1.00)
GFR calc Af Amer: 86 mL/min/{1.73_m2} (ref >59)
GFR calc non Af Amer: 74 mL/min/{1.73_m2} (ref >59)
Glucose: 98 mg/dL (ref 65–99)
Potassium: 4.6 mmol/L (ref 3.5–5.2)
Sodium: 140 mmol/L (ref 134–144)

## 2019-06-01 LAB — BRAIN NATRIURETIC PEPTIDE: BNP: 17.4 pg/mL (ref 0.0–100.0)

## 2019-06-18 HISTORY — PX: OTHER SURGICAL HISTORY: SHX169

## 2019-06-28 DIAGNOSIS — L820 Inflamed seborrheic keratosis: Secondary | ICD-10-CM | POA: Diagnosis not present

## 2019-06-30 ENCOUNTER — Encounter: Payer: Self-pay | Admitting: Cardiology

## 2019-06-30 ENCOUNTER — Other Ambulatory Visit: Payer: Self-pay

## 2019-06-30 ENCOUNTER — Ambulatory Visit: Payer: PPO | Admitting: Cardiology

## 2019-06-30 VITALS — BP 123/69 | HR 70 | Ht 67.0 in | Wt 231.0 lb

## 2019-06-30 DIAGNOSIS — I2510 Atherosclerotic heart disease of native coronary artery without angina pectoris: Secondary | ICD-10-CM | POA: Diagnosis not present

## 2019-06-30 DIAGNOSIS — E7849 Other hyperlipidemia: Secondary | ICD-10-CM | POA: Diagnosis not present

## 2019-06-30 DIAGNOSIS — R0609 Other forms of dyspnea: Secondary | ICD-10-CM

## 2019-06-30 DIAGNOSIS — R002 Palpitations: Secondary | ICD-10-CM | POA: Diagnosis not present

## 2019-06-30 DIAGNOSIS — R0600 Dyspnea, unspecified: Secondary | ICD-10-CM

## 2019-06-30 NOTE — Patient Instructions (Signed)
Medication Instructions:    WITH YOUR PALPATIONS - YOU MAY TAKE AN ADDITIONAL PROPANOLOL  OR TAKE YOUR NEXT DOSE EARLY   NO OTHER CHANGES  *If you need a refill on your cardiac medications before your next appointment, please call your pharmacy*  Lab Work: NOT NEEDED  Testing/Procedures:  WILL BE SCHEDULE AT [place] [address]  Your physician has requested that you have a lexiscan myoview. For further information please visit [email or web address]. Please follow instruction sheet, as given.   Follow-Up: At [facility], you and your health needs are our priority.  As part of our continuing mission to provide you with exceptional heart care, we have created designated Provider Care Teams.  These Care Teams include your primary Cardiologist (physician) and Advanced Practice Providers (APPs -  Physician Assistants and Nurse Practitioners) who all work together to provide you with the care you need, when you need it.  Your next appointment:    1 MONTH VISIT  IF TEST IS NORMAL , IF ABNORMAL WILL SCHEDULE A CARDIAC CATHETERIZATION  The format for your next appointment:   In Person  Provider:   Glenetta Hew, MD  Other Instructions

## 2019-06-30 NOTE — Progress Notes (Signed)
Primary Care Provider: Kelton Pillar, MD Cardiologist: No primary care provider on file. Electrophysiologist:   Clinic Note: Chief Complaint  Patient presents with  . Follow-up    test results  . Shortness of Breath    even worse    HPI:    Annette [name] is a 68 y.o. female (former 37 pk/yr smoker) originally referred for Coronary Artery Calcification seen on Chest ST who presents today for 1 month f/u to discuss results of Echo & Labs ordered to assess significant exertional dyspnea.  Annette [name] was last seen on 05/23/2019 via telemedicine - noted progressively worsening DOE & edema.  --> Echo&BNP ordered.   Recent Hospitalizations: none  Reviewed  CV studies:    The following studies were reviewed today: (if available, images/films reviewed: From Epic Chart or Care Everywhere) . TTE 05/31/2019:  EF 60-65%. Gr 1 DD (normal for Age). Normal valves & chamber sizes.  Marland Kitchen BNP 17   Interval History:   Annette [name] returns today noting that despite the improvement in her edema with HCTZ, this did not seem to make much of a difference when it comes to her exertional dyspnea.  She is now limited to simply walking around the house & will start to get dizzy & lightheaded if she were to walk through the shortness of breath.  She is not short of breath @ rest, but notes it with minimal activity.  Simply doing routine tasks will cause her to have to stop to catch her breath.  She says that her chest will feel a little tight, but would not describe it as chest pain.    CV Review of Symptoms (Summary) positive for - dyspnea on exertion, rapid heart rate, shortness of breath and extreme exercise intolerance negative for - chest pain, edema, irregular heartbeat, loss of consciousness, orthopnea, paroxysmal nocturnal dyspnea or syncope / near syncope -- but does get dizzy with dyspnea; TIA/amaurosis fugax  The patient does not have symptoms concerning for COVID-19 infection (fever, chills,  cough, or new shortness of breath).  The patient is practicing social distancing. ++  Masking.  Rarely goes out for groceries/shopping.     REVIEWED OF SYSTEMS   A comprehensive ROS was performed. Review of Systems  Constitutional: Positive for malaise/fatigue. Negative for weight loss.  HENT: Negative for congestion and nosebleeds.   Respiratory: Positive for shortness of breath (Exertional, worsening).   Cardiovascular: Negative for chest pain (Only tightness with shortness of breath).  Gastrointestinal: Negative for blood in stool, heartburn and melena.  Genitourinary: Negative for hematuria.  Musculoskeletal: Negative for falls and joint pain.  Neurological: Positive for dizziness (When short of breath). Negative for focal weakness.  Psychiatric/Behavioral: Negative for memory loss. The patient is not nervous/anxious and does not have insomnia.   All other systems reviewed and are negative.   I have reviewed and (if needed) personally updated the patient's problem list, medications, allergies, past medical and surgical history, social and family history.   PAST MEDICAL HISTORY   Past Medical History:  Diagnosis Date  . Arthritis    Right hip, end stage  . Coronary artery calcification seen on computed tomography 02/2018   Coronary calcium score 79.  Coronary CT angiogram: Mild CAD and proximal LAD, proximal RCA and mid LCx.  Moderate plaque ostial LCx.  Mildly dilated pulmonary artery, suggestive of possible pulmonary hypertension  . Difficulty sleeping    DUE TO PAIN  . Fluid retention    TAKES HCTZ  .  Hemorrhoids   . Hypertension   . Hypothyroidism   . Irritable bowel syndrome   . Shingles 2004  . Tremors of nervous system    TAKES PROPRANOLOL TO TX  . Varicose veins     PAST SURGICAL HISTORY   Past Surgical History:  Procedure Laterality Date  . ABDOMINAL HYSTERECTOMY  2000  . CATARACT EXTRACTION Right   . CHOLECYSTECTOMY  1994  . COLONOSCOPY    . ERCP  2004    sludge in CBD (jaundiced)  . ESOPHAGOGASTRODUODENOSCOPY    . INCISE AND DRAIN ABCESS Left 05/2016   wrist  . INCISION / DRAINAGE HAND / FINGER     LEFT INDEX FINGER  . JOINT REPLACEMENT    . OVARIAN CYST REMOVAL  [id number]  . ROTATOR CUFF REPAIR     left  . TOTAL HIP ARTHROPLASTY  06/2010   right  . TOTAL HIP ARTHROPLASTY Left 03/22/2015   Procedure: LEFT TOTAL HIP ARTHROPLASTY ANTERIOR APPROACH;  Surgeon: Gaynelle Arabian, MD;  Location: [facility];  Service: Orthopedics;  Laterality: Left;  . TRANSTHORACIC ECHOCARDIOGRAM  05/31/2019   EF 60-65%. Gr 1 DD (normal for Age). Normal valves & chamber sizes.     CCor CTA -- 02/18/2018 1. Coronary calcium score of 79. This was 27 percentile for age and sex matched control.  2. Normal coronary origin with right dominance.  3. Mild CAD in the proximal LAD, proximal RCA and mid LCX arteries, moderate plaque in the ostial LCX artery. Aggressive risk factor modification is recommended.  4. Dilated pulmonary artery measuring 33 mm suggestive of pulmonary hypertension.  MEDICATIONS/ALLERGIES   Current Meds  Medication Sig  . aspirin EC 81 MG tablet Take 81 mg by mouth daily.  . Cholecalciferol (KP VITAMIN D3) 50 MCG (2000 UT) CAPS Take by mouth.  . hydrochlorothiazide (HYDRODIURIL) 25 MG tablet Take 1 tablet (25 mg total) by mouth daily.  Marland Kitchen levothyroxine (SYNTHROID, LEVOTHROID) 100 MCG tablet take 1 tablet by mouth every morning ON AN EMPTY STOMACH  . Multiple Vitamin (MULTIVITAMIN) tablet Take 1 tablet by mouth daily.  . propranolol ER (INDERAL LA) 60 MG 24 hr capsule Take 60 mg by mouth daily.  . rosuvastatin (CRESTOR) 5 MG tablet Take 5 mg by mouth daily.    Allergies  Allergen Reactions  . Augmentin [Amoxicillin-Pot Clavulanate] Nausea And Vomiting  . Codeine Other (See Comments)    Causes her to stay awake   . Other Hives    IV contrast   . Iodinated Diagnostic Agents Rash    SOCIAL HISTORY/FAMILY HISTORY   Social History    Tobacco Use  . Smoking status: Former Smoker    Packs/day: 1.00    Years: 38.00    Pack years: 38.00    Quit date: 03/14/2009    Years since quitting: 10.3  . Smokeless tobacco: Never Used  Substance Use Topics  . Alcohol use: Yes    Comment: beer every 6-8 months  . 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   OBJCTIVE -PE, EKG, labs   Wt Readings from Last 3 Encounters:  06/30/19 231 lb (104.8 kg)  03/11/18 219 lb (99.3 kg)  12/13/17  220 lb 12.8 oz (100.2 kg)  July 2019-219 pounds.  Physical Exam: BP 123/69   Pulse 70   Ht 5\' 7"  (1.702 m)   Wt 231 lb (104.8 kg)   SpO2 95%   BMI 36.18 kg/m  Physical Exam  Constitutional: She is oriented to person, place, and time. She appears well-developed and well-nourished. No distress.  HENT:  Head: Normocephalic and atraumatic.  Neck: Neck supple. No hepatojugular reflux and no JVD present. Carotid bruit is not present.  Cardiovascular: Normal rate, regular rhythm, normal heart sounds and intact distal pulses.  No extrasystoles are present. PMI is not displaced. Exam reveals no gallop and no friction rub.  No murmur heard. Pulmonary/Chest: Effort normal. No respiratory distress. She has no wheezes. She has no rales.  Abdominal: Bowel sounds are normal. She exhibits no distension. There is no abdominal tenderness. There is no rebound.  Musculoskeletal: Normal range of motion.        General: Edema (Minimal) present.  Neurological: She is alert and oriented to person, place, and time.  Psychiatric: She has a normal mood and affect. Her behavior is normal. Judgment and thought content normal.  Vitals reviewed.    Adult ECG Report n/a  Recent Labs:  Lipids from 05/03/2019: TC 177, HDL 58, LDL 94, TG 125.  CR 0.81.  K+ 4.60. No results found for: CHOL, HDL, LDLCALC, LDLDIRECT, TRIG, CHOLHDL Lab Results  Component Value Date   CREATININE 0.81 05/31/2019   BUN 19 05/31/2019   NA 140 05/31/2019   K 4.6 05/31/2019   CL 95 (L) 05/31/2019   CO2 30 (H) 05/31/2019    May 31, 2019 BNP 17  ASSESSMENT/PLAN    Problem List Items Addressed This Visit    DOE (dyspnea on exertion) (Chronic)    At this point, she has progressively worsening exertional dyspnea to the point where it is limiting her activity.  So far, her echocardiogram looked relatively normal, and her BNP was also normal excluding CHF.  She did have some modest disease on her coronary CTA, and therefore we will reevaluate for coronary ischemia with a Myoview stress test. If this is abnormal, we will proceed directly to cardiac catheterization as I discussed cardiac catheterization procedure with her.  If the stress test is negative, would then need to consider referral to pulmonary medicine.      Relevant Orders   LEXISCAN---MYOCARDIAL PERFUSION IMAGING   Coronary artery calcification seen on computed tomography - Primary (Chronic)    Mild disease noted on coronary CTA with relatively low coronary calcium score.  However now she is having progressively worsening dyspnea.  At this point, need to exclude progression of disease.  Plan: Lexiscan Myoview.-  If abnormal, plan cardiac catheterization.  If normal, will refer to pulmonary medicine      Relevant Orders   LEXISCAN---MYOCARDIAL PERFUSION IMAGING   Hyperlipidemia due to dietary fat intake (Chronic)    Currently taking 5 mg of Crestor daily.  Her most recent LDL was 94.  The pending results of her Myoview, may need to be more aggressive and increase to 10 mg.      Palpitations (Chronic)    Palpitations seen me doing pretty well on propranolol.  If she has progressive symptoms, we can have her take an additional  dose, or take a dose early.      Relevant Orders   LEXISCAN---MYOCARDIAL PERFUSION IMAGING      COVID-19 Education: The signs and symptoms of COVID-19 were discussed with the  patient and how to seek care for testing (follow up with PCP or arrange E-visit).   The importance of social distancing was discussed today.  I spent a total of 53minutes with the patient and chart review. >  50% of the time was spent in direct patient consultation.  Additional time spent with chart review (studies, outside notes, etc): 4 Total Time: 28 min   Current medicines are reviewed at length with the patient today.  (+/- concerns) n/a   Patient Instructions / Medication Changes & Studies & Tests Ordered   Patient Instructions  Medication Instructions:    WITH YOUR PALPATIONS - YOU MAY TAKE AN ADDITIONAL PROPANOLOL  OR TAKE YOUR NEXT DOSE EARLY   NO OTHER CHANGES  *If you need a refill on your cardiac medications before your next appointment, please call your pharmacy*  Lab Work: NOT NEEDED  Testing/Procedures:  WILL BE SCHEDULE AT [place] [facility]  Your physician has requested that you have a lexiscan myoview. For further information please visit [email or web address]. Please follow instruction sheet, as given.   Follow-Up: At [facility], you and your health needs are our priority.  As part of our continuing mission to provide you with exceptional heart care, we have created designated Provider Care Teams.  These Care Teams include your primary Cardiologist (physician) and Advanced Practice Providers (APPs -  Physician Assistants and Nurse Practitioners) who all work together to provide you with the care you need, when you need it.  Your next appointment:    1 MONTH VISIT  IF TEST IS NORMAL , IF ABNORMAL WILL SCHEDULE A CARDIAC CATHETERIZATION  The format for your next appointment:   In Person  Provider:   Glenetta Hew, MD  Other Instructions   Performing MD:  Glenetta Hew, M.D., M.S.  Procedure:  LEFT HEART CATHETERIZATION WITH CORONARY ANGIOGRAPHY & possible PERCUTANEOUS CORONARY INTERVENTION  The procedure with Risks/Benefits/Alternatives and Indications was reviewed with the patient .  All questions were answered.    Risks / Complications include, but not limited to: Death, MI, CVA/TIA, VF/VT (with defibrillation), Bradycardia (need for temporary pacer placement), contrast induced nephropathy, bleeding / bruising / hematoma / pseudoaneurysm, vascular or coronary injury (with possible emergent CT or Vascular Surgery), adverse medication reactions, infection.  Additional risks involving the use of radiation with the possibility of radiation burns and cancer were explained in detail.  The patient voiced understanding and agrees to proceed if indicated by an abnormal Myoview..    Studies Ordered:   Orders Placed This Encounter  Procedures  . LEXISCAN---MYOCARDIAL PERFUSION IMAGING   - would like to do TM Myoview - but due to COVID-19 restrictions, these are being discouraged.    Glenetta Hew, M.D., M.S. Interventional Cardiologist   Pager # [phone] Phone # [phone] [address] [place],  [address]   Thank you for choosing Heartcare at [facility]!!

## 2019-07-02 ENCOUNTER — Encounter: Payer: Self-pay | Admitting: Cardiology

## 2019-07-02 NOTE — Assessment & Plan Note (Signed)
Mild disease noted on coronary CTA with relatively low coronary calcium score.  However now she is having progressively worsening dyspnea.  At this point, need to exclude progression of disease.  Plan: Lexiscan Myoview.-  If abnormal, plan cardiac catheterization.  If normal, will refer to pulmonary medicine

## 2019-07-02 NOTE — Assessment & Plan Note (Signed)
Currently taking 5 mg of Crestor daily.  Her most recent LDL was 94.  The pending results of her Myoview, may need to be more aggressive and increase to 10 mg.

## 2019-07-02 NOTE — Assessment & Plan Note (Signed)
Palpitations seen me doing pretty well on propranolol.  If she has progressive symptoms, we can have her take an additional dose, or take a dose early.

## 2019-07-02 NOTE — Assessment & Plan Note (Signed)
At this point, she has progressively worsening exertional dyspnea to the point where it is limiting her activity.  So far, her echocardiogram looked relatively normal, and her BNP was also normal excluding CHF.  She did have some modest disease on her coronary CTA, and therefore we will reevaluate for coronary ischemia with a Myoview stress test. If this is abnormal, we will proceed directly to cardiac catheterization as I discussed cardiac catheterization procedure with her.  If the stress test is negative, would then need to consider referral to pulmonary medicine.

## 2019-07-04 ENCOUNTER — Telehealth (HOSPITAL_COMMUNITY): Payer: Self-pay

## 2019-07-04 NOTE — Telephone Encounter (Signed)
Encounter complete. 

## 2019-07-05 ENCOUNTER — Other Ambulatory Visit: Payer: Self-pay

## 2019-07-05 ENCOUNTER — Ambulatory Visit (HOSPITAL_COMMUNITY)
Admission: RE | Admit: 2019-07-05 | Discharge: 2019-07-05 | Disposition: A | Discharge status: Home / Self Care | Payer: PPO | Source: Ambulatory Visit | Attending: Cardiology | Admitting: Cardiology

## 2019-07-05 DIAGNOSIS — R002 Palpitations: Secondary | ICD-10-CM | POA: Diagnosis not present

## 2019-07-05 DIAGNOSIS — I2510 Atherosclerotic heart disease of native coronary artery without angina pectoris: Secondary | ICD-10-CM | POA: Insufficient documentation

## 2019-07-05 DIAGNOSIS — R0600 Dyspnea, unspecified: Secondary | ICD-10-CM | POA: Diagnosis not present

## 2019-07-05 DIAGNOSIS — R0609 Other forms of dyspnea: Secondary | ICD-10-CM

## 2019-07-05 LAB — MYOCARDIAL PERFUSION IMAGING
LV dias vol: 76 mL (ref 46–106)
LV sys vol: 28 mL
Peak HR: 90 {beats}/min
Rest HR: 56 {beats}/min
SDS: 1
SRS: 5
SSS: 6
TID: 1.05

## 2019-07-05 MED ORDER — TECHNETIUM TC 99M TETROFOSMIN IV KIT
32.1000 | PACK | Freq: Once | INTRAVENOUS | Status: AC | PRN
  Administered 2019-07-05: 32.1 via INTRAVENOUS
  Filled 2019-07-05: qty 33

## 2019-07-05 MED ORDER — TECHNETIUM TC 99M TETROFOSMIN IV KIT
10.4000 | PACK | Freq: Once | INTRAVENOUS | Status: AC | PRN
  Administered 2019-07-05: 10.4 via INTRAVENOUS
  Filled 2019-07-05: qty 11

## 2019-07-05 MED ORDER — REGADENOSON 0.4 MG/5ML IV SOLN
0.4000 mg | Freq: Once | INTRAVENOUS | Status: AC
  Administered 2019-07-05: 0.4 mg via INTRAVENOUS

## 2019-07-05 MED ORDER — AMINOPHYLLINE 25 MG/ML IV SOLN
75.0000 mg | Freq: Once | INTRAVENOUS | Status: AC
  Administered 2019-07-05: 75 mg via INTRAVENOUS

## 2019-07-06 ENCOUNTER — Telehealth: Payer: Self-pay | Admitting: *Deleted

## 2019-07-06 DIAGNOSIS — R0609 Other forms of dyspnea: Secondary | ICD-10-CM

## 2019-07-06 NOTE — Telephone Encounter (Signed)
Patient aware - reviewed via mychart   patient preferred seeing Dr Brock Ra - pulm. Referral sent

## 2019-07-06 NOTE — Telephone Encounter (Signed)
-----   Message from Leonie Man, MD sent at 07/05/2019  5:19 PM EST ----- Doristine Devoid news: LOW RISK/NORMAL nuclear stress test with normal pump function.  No evidence of any heart artery disease blockage that could be causing symptoms of shortness of breath.  Next step would be referral to pulmonary medicine  Glenetta Hew, MD  Ivin Booty - can you put in [place] Referral for progressive dyspnea.

## 2019-07-27 ENCOUNTER — Other Ambulatory Visit: Payer: Self-pay

## 2019-07-27 DIAGNOSIS — Z20822 Contact with and (suspected) exposure to covid-19: Secondary | ICD-10-CM

## 2019-07-29 LAB — NOVEL CORONAVIRUS, NAA: SARS-CoV-2, NAA: NOT DETECTED

## 2019-07-31 ENCOUNTER — Telehealth (INDEPENDENT_AMBULATORY_CARE_PROVIDER_SITE_OTHER): Payer: PPO | Admitting: Cardiology

## 2019-07-31 ENCOUNTER — Encounter: Payer: Self-pay | Admitting: Cardiology

## 2019-07-31 ENCOUNTER — Telehealth: Payer: Self-pay | Admitting: Cardiology

## 2019-07-31 VITALS — Ht 67.0 in

## 2019-07-31 DIAGNOSIS — R002 Palpitations: Secondary | ICD-10-CM | POA: Diagnosis not present

## 2019-07-31 DIAGNOSIS — R0609 Other forms of dyspnea: Secondary | ICD-10-CM

## 2019-07-31 DIAGNOSIS — I2510 Atherosclerotic heart disease of native coronary artery without angina pectoris: Secondary | ICD-10-CM | POA: Diagnosis not present

## 2019-07-31 DIAGNOSIS — R601 Generalized edema: Secondary | ICD-10-CM | POA: Diagnosis not present

## 2019-07-31 DIAGNOSIS — R0600 Dyspnea, unspecified: Secondary | ICD-10-CM

## 2019-07-31 DIAGNOSIS — E7849 Other hyperlipidemia: Secondary | ICD-10-CM

## 2019-07-31 MED ORDER — AMLODIPINE BESYLATE 2.5 MG PO TABS: 2.5000 mg | ORAL_TABLET | Freq: Every day | ORAL | 3 refills | Status: DC

## 2019-07-31 NOTE — Patient Instructions (Signed)
Medication Instructions:  START TAKING AMLODIPINE 2.5 MG  AT BEDTIME  *If you need a refill on your cardiac medications before your next appointment, please call your pharmacy*  Lab Work: NOT NEEDED   Testing/Procedures: NOT NEEDED  Follow-Up: At [facility], you and your health needs are our priority.  As part of our continuing mission to provide you with exceptional heart care, we have created designated Provider Care Teams.  These Care Teams include your primary Cardiologist (physician) and Advanced Practice Providers (APPs -  Physician Assistants and Nurse Practitioners) who all work together to provide you with the care you need, when you need it.  Your next appointment:   4 month(s)- April   The format for your next appointment:   Either In Person or Virtual  Provider:   Glenetta Hew, MD  Other Instructions KEEP APPOINTMENT WITH DR [place]

## 2019-07-31 NOTE — Progress Notes (Signed)
Virtual Visit via Video Note   This visit type was conducted due to national recommendations for restrictions regarding the COVID-19 Pandemic (e.g. social distancing) in an effort to limit this patient's exposure and mitigate transmission in our community.  Due to her co-morbid illnesses, this patient is at least at moderate risk for complications without adequate follow up.  This format is felt to be most appropriate for this patient at this time.  All issues noted in this document were discussed and addressed.  A limited physical exam was performed with this format.  Please refer to the patient's chart for her consent to telehealth for [facility].   Patient has given verbal permission to conduct this visit via virtual appointment and to bill insurance 08/02/2019 10:26 PM     Evaluation Performed:  Follow-up visit  Date:  08/02/2019  ID:  Annette [name] [birth year], MRN [id number]   Patient Location: Home Provider Location: Office  Primary Care Provider: Kelton Pillar, MD Cardiologist: Glenetta Hew, MD Electrophysiologist:   Clinic Note: Chief Complaint  Patient presents with  . Follow-up    Stress test results  . Shortness of Breath    HPI:    Annette [name] is a 68 y.o. female (former 68 pk/yr smoker) initially seen  Coronary Artery Calcification seen on Chest CT scan, who was recently evaluated with an echocardiogram to evaluate exertional dyspnea.   She had a Coronary CTA in July 2019 showing Coronary Calcium Score of 79 with suggested only mild CAD.  Annette [name] was last seen on 06/30/2019 for an in person follow-up to discuss the results of echocardiogram and relatively normal BNP level.  She noted improved edema on HCTZ but no real change in exertional dyspnea.  Noted to walk around the house, notes getting lightheaded and dizzy with significant shortness of breath just walking around the house.  Nothing at rest.  Routine tasks caused her to stop to  catch her breath.  If she continues will have some chest tightness. --> We ordered a Lexiscan Myoview  Recent Hospitalizations: none   Reviewed  CV studies:    The following studies were reviewed today: (if available, images/films reviewed: From Epic Chart or Care Everywhere) . Lexiscan Myoview 07/05/2019: LOW RISK.  EF 60 to 65%.  o I personally reviewed the study, while it is read as low risk,  there is what appears to be a fixed anteroseptal apical defect.  This finding is most likely has evidence of breast attenuation,   Interval History:   Annette [name] returns today to discuss results of Lexiscan Myoview.  She still notes that she is having shortness of breath with exertion.  Unable to walk around the block now where she used to be on do much more than that. Palpitations have notably improved.  She denies any real resting dyspnea or PND, orthopnea.  She says that her heart rate goes up relatively quickly when she exercises, but not usually at rest.  CV Review of Symptoms (Summary) positive for - dyspnea on exertion, rapid heart rate, shortness of breath and extreme exercise intolerance negative for - chest pain, edema, irregular heartbeat, loss of consciousness, orthopnea, paroxysmal nocturnal dyspnea or syncope / near syncope -- but does get dizzy with dyspnea; TIA/amaurosis fugax  The patient DOES NOT have symptoms concerning for COVID-19 infection (fever, chills, cough, or new shortness of breath).  The patient is practicing social distancing.  She is consistent  with masking.  Rarely goes out for groceries/shopping.     REVIEWED OF SYSTEMS   A comprehensive ROS was performed. Review of Systems  Constitutional: Positive for malaise/fatigue. Negative for weight loss.  HENT: Negative for congestion and nosebleeds.   Respiratory: Positive for shortness of breath (Exertional, worsening) and wheezing (Occasional). Negative for cough.   Cardiovascular: Negative for chest pain (Only  tightness with shortness of breath).  Gastrointestinal: Negative for blood in stool, heartburn and melena.  Genitourinary: Negative for hematuria.  Musculoskeletal: Negative for falls and joint pain.  Neurological: Positive for dizziness (When short of breath). Negative for focal weakness.  Psychiatric/Behavioral: Negative for memory loss. The patient is not nervous/anxious and does not have insomnia.   All other systems reviewed and are negative.  I have reviewed and (if needed) personally updated the patient's problem list, medications, allergies, past medical and surgical history, social and family history.   PAST MEDICAL HISTORY   Past Medical History:  Diagnosis Date  . Arthritis    Right hip, end stage  . Coronary artery calcification seen on computed tomography 02/2018   Coronary calcium score 79.  Coronary CT angiogram: Mild CAD and proximal LAD, proximal RCA and mid LCx.  Moderate plaque ostial LCx.  Mildly dilated pulmonary artery, suggestive of possible pulmonary hypertension  . Difficulty sleeping    DUE TO PAIN  . Fluid retention    TAKES HCTZ  . Hemorrhoids   . Hypertension   . Hypothyroidism   . Irritable bowel syndrome   . Shingles 2004  . Tremors of nervous system    TAKES PROPRANOLOL TO TX  . Varicose veins     PAST SURGICAL HISTORY   Past Surgical History:  Procedure Laterality Date  . ABDOMINAL HYSTERECTOMY  2000  . CATARACT EXTRACTION Right   . CHOLECYSTECTOMY  1994  . COLONOSCOPY    . ERCP  2004   sludge in CBD (jaundiced)  . ESOPHAGOGASTRODUODENOSCOPY    . INCISE AND DRAIN ABCESS Left 05/2016   wrist  . INCISION / DRAINAGE HAND / FINGER     LEFT INDEX FINGER  . JOINT REPLACEMENT    . OVARIAN CYST REMOVAL  [id number]  . ROTATOR CUFF REPAIR     left  . TOTAL HIP ARTHROPLASTY  06/2010   right  . TOTAL HIP ARTHROPLASTY Left 03/22/2015   Procedure: LEFT TOTAL HIP ARTHROPLASTY ANTERIOR APPROACH;  Surgeon: Gaynelle Arabian, MD;  Location: [facility];   Service: Orthopedics;  Laterality: Left;  . TRANSTHORACIC ECHOCARDIOGRAM  05/31/2019   EF 60-65%. Gr 1 DD (normal for Age). Normal valves & chamber sizes.     CCor CTA -- 02/18/2018 1. Coronary calcium score of 79. This was 26 percentile for age and sex matched control.  2. Normal coronary origin with right dominance.  3. Mild CAD in the proximal LAD, proximal RCA and mid LCX arteries, moderate plaque in the ostial LCX artery. Aggressive risk factor modification is recommended.  4. Dilated pulmonary artery measuring 33 mm suggestive of pulmonary hypertension.  MEDICATIONS/ALLERGIES   Current Meds  Medication Sig  . ALPRAZolam (XANAX) 0.25 MG tablet TK 1 T PO QD PRN  . aspirin EC 81 MG tablet Take 81 mg by mouth daily.  . Cholecalciferol (KP VITAMIN D3) 50 MCG (2000 UT) CAPS Take by mouth.  . hydrochlorothiazide (HYDRODIURIL) 25 MG tablet Take 1 tablet (25 mg total) by mouth daily.  Marland Kitchen ketoconazole (NIZORAL) 2 % cream APPLY ON THE SKIN BID UNTIL 1 WEEK POST  CLEAR  . levothyroxine (SYNTHROID, LEVOTHROID) 100 MCG tablet take 1 tablet by mouth every morning ON AN EMPTY STOMACH  . Multiple Vitamin (MULTIVITAMIN) tablet Take 1 tablet by mouth daily.  Marland Kitchen neomycin-polymyxin b-dexamethasone (MAXITROL) 3.5-10000-0.1 OINT APP TO RIGHT EYE BID  . propranolol ER (INDERAL LA) 60 MG 24 hr capsule Take 60 mg by mouth daily.  . rosuvastatin (CRESTOR) 5 MG tablet Take 5 mg by mouth daily.    Allergies  Allergen Reactions  . Augmentin [Amoxicillin-Pot Clavulanate] Nausea And Vomiting  . Codeine Other (See Comments)    Causes her to stay awake   . Other Hives    IV contrast   . Iodinated Diagnostic Agents Rash    SOCIAL HISTORY/FAMILY HISTORY   Social History   Tobacco Use  . Smoking status: Former Smoker    Packs/day: 1.00    Years: 38.00    Pack years: 38.00    Quit date: 03/14/2009    Years since quitting: 10.3  . Smokeless tobacco: Never Used  Substance Use Topics  . Alcohol use: Yes      Comment: beer every 6-8 months  . Drug use: No   Social History   Social History Narrative   [employer]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 1 son   1 caffeine drink daily   Updated 06/01/2013    Family History family history includes Alzheimer's disease in her father; Breast cancer in her sister; CAD (age of onset: 49) in her mother; Stroke in her mother.   OBJCTIVE -PE, EKG, labs   Wt Readings from Last 3 Encounters:  07/05/19 231 lb (104.8 kg)  06/30/19 231 lb (104.8 kg)  03/22/15 204 lb (92.5 kg)  July 2019-219 pounds.    Physical Exam: Ht 5\' 7"  (1.702 m)   BMI 36.18 kg/m  139/66 mmHg ;HR~75bpm Physical Exam  Constitutional: She is oriented to person, place, and time. She appears well-developed and well-nourished. No distress.  HENT:  Head: Normocephalic and atraumatic.  Pulmonary/Chest: Effort normal.  Musculoskeletal:     Cervical back: Normal range of motion.  Neurological: She is alert and oriented to person, place, and time.  Psychiatric: She has a normal mood and affect. Her behavior is normal. Judgment and thought content normal.    Adult ECG Report n/a  Recent Labs:  Lipids from 05/03/2019: TC 177, HDL 58, LDL 94, TG 125.  CR 0.81.  K+ 4.60. No results found for: CHOL, HDL, LDLCALC, LDLDIRECT, TRIG, CHOLHDL Lab Results  Component Value Date   CREATININE 0.81 05/31/2019   BUN 19 05/31/2019   NA 140 05/31/2019   K 4.6 05/31/2019   CL 95 (L) 05/31/2019   CO2 30 (H) 05/31/2019    May 31, 2019 BNP 17  ASSESSMENT/PLAN    Problem List Items Addressed This Visit    DOE (dyspnea on exertion) - Primary (Chronic)    We now have a relatively benign coronary artery calcium score and scoring CT angiogram along with the somewhat normal echocardiogram and now nonischemic Myoview.  Fixed apical septal defect is probably breast attenuation.  At this point, the only potential cardiac etiology for her symptoms would be microvascular disease.  This is difficult to  prove, but if symptoms are otherwise unexplainable, not unreasonable to consider treating. There also could be some component of of exertional pulmonary pretension.  Plan:   Add amlodipine 2.5 mg every afternoon.  Continue with pulmonary medicine evaluation      Coronary artery calcification seen on computed  tomography (Chronic)    Relatively suggests no evidence of macro vascular.  Last year she had a coronary CTA which showed disease relatively low coronary calcium score now she will my ischemic.  This would relatively convincingly suggest no significant macrovascular coronary disease, however we cannot exclude microvascular disease. With the concern of possible obesity hypoventilation complement as well as potential microvascular disease, will add amlodipine.  Continue statin with desire to keep LDL least less than 100 if not closer to 70 if there is concern for microvascular disease.      Relevant Medications   amLODipine (NORVASC) 2.5 MG tablet   Hyperlipidemia due to dietary fat intake (Chronic)    LDL was 94 on current dose of Crestor.  With a nonischemic Myoview and reassuring coronary calcium score, we do not need to be overly aggressive.  For now we will monitor her current dose, as long as it is staying steady if not improving.  Otherwise if LDL goes up would probably would be more aggressive.      Relevant Medications   amLODipine (NORVASC) 2.5 MG tablet   Palpitations (Chronic)    Definitely improved on propranolol.  No change for now.      Generalized edema      COVID-19 Education: The signs and symptoms of COVID-19 were discussed with the patient and how to seek care for testing (follow up with PCP or arrange E-visit).   The importance of social distancing was discussed today.  I spent a total of 24 minutes with the patient and chart review. >  50% of the time was spent in direct patient consultation.  Additional time spent with chart review (studies, outside  notes, etc): 8 Total Time: 32 min   Current medicines are reviewed at length with the patient today.  (+/- concerns) n/a   Patient Instructions / Medication Changes & Studies & Tests Ordered   Patient Instructions  Medication Instructions:  START TAKING AMLODIPINE 2.5 MG  AT BEDTIME  *If you need a refill on your cardiac medications before your next appointment, please call your pharmacy*  Lab Work: NOT NEEDED   Testing/Procedures: NOT NEEDED  Follow-Up: At [facility], you and your health needs are our priority.  As part of our continuing mission to provide you with exceptional heart care, we have created designated Provider Care Teams.  These Care Teams include your primary Cardiologist (physician) and Advanced Practice Providers (APPs -  Physician Assistants and Nurse Practitioners) who all work together to provide you with the care you need, when you need it.  Your next appointment:   4 month(s)- April   The format for your next appointment:   Either In Person or Virtual  Provider:   Glenetta Hew, MD  Other Instructions KEEP APPOINTMENT WITH DR [place]  AT NEXT APPOINTMENT      Studies Ordered:   No orders of the defined types were placed in this encounter.   Glenetta Hew, M.D., M.S. Interventional Cardiologist   Pager # [phone] Phone # [phone] [address] [place], [place] [address]   Thank you for choosing Heartcare at [facility]!!

## 2019-07-31 NOTE — Telephone Encounter (Signed)
New message      *STAT* If patient is at the pharmacy, call can be transferred to refill team.   1. Which medications need to be refilled? (please list name of each medication and dose if known) amLODipine (NORVASC) 2.5 MG tablet  2. Which pharmacy/location (including street and city if local pharmacy) is medication to be sent to? [id number] - [place], [place] [address] AT [place]  3. Do they need a 30 day or 90 day supply? 30 or 90

## 2019-08-01 ENCOUNTER — Telehealth: Payer: Self-pay | Admitting: Cardiology

## 2019-08-01 NOTE — Telephone Encounter (Signed)
*  STAT* If patient is at the pharmacy, call can be transferred to refill team.   1. Which medications need to be refilled? (please list name of each medication and dose if known) amLODipine (NORVASC) 2.5 MG tablet  2. Which pharmacy/location (including street and city if local pharmacy) is medication to be sent to? [id number] - [place], [place] [address] AT [place]  3. Do they need a 30 day or 90 day supply? 30 day   Patient states prescription was sent to [facility] instead of [facility]. She called yesterday to have it sent to the right location, but [facility] states they still have not received it. Please Advise.

## 2019-08-02 ENCOUNTER — Encounter: Payer: Self-pay | Admitting: Cardiology

## 2019-08-02 NOTE — Assessment & Plan Note (Signed)
LDL was 94 on current dose of Crestor.  With a nonischemic Myoview and reassuring coronary calcium score, we do not need to be overly aggressive.  For now we will monitor her current dose, as long as it is staying steady if not improving.  Otherwise if LDL goes up would probably would be more aggressive.

## 2019-08-02 NOTE — Assessment & Plan Note (Signed)
Relatively suggests no evidence of macro vascular.  Last year she had a coronary CTA which showed disease relatively low coronary calcium score now she will my ischemic.  This would relatively convincingly suggest no significant macrovascular coronary disease, however we cannot exclude microvascular disease. With the concern of possible obesity hypoventilation complement as well as potential microvascular disease, will add amlodipine.  Continue statin with desire to keep LDL least less than 100 if not closer to 70 if there is concern for microvascular disease.

## 2019-08-02 NOTE — Assessment & Plan Note (Signed)
Definitely improved on propranolol.  No change for now.

## 2019-08-02 NOTE — Assessment & Plan Note (Signed)
We now have a relatively benign coronary artery calcium score and scoring CT angiogram along with the somewhat normal echocardiogram and now nonischemic Myoview.  Fixed apical septal defect is probably breast attenuation.  At this point, the only potential cardiac etiology for her symptoms would be microvascular disease.  This is difficult to prove, but if symptoms are otherwise unexplainable, not unreasonable to consider treating. There also could be some component of of exertional pulmonary pretension.  Plan:   Add amlodipine 2.5 mg every afternoon.  Continue with pulmonary medicine evaluation

## 2019-08-03 ENCOUNTER — Telehealth: Payer: Self-pay | Admitting: Cardiology

## 2019-08-03 NOTE — Telephone Encounter (Signed)
Pt c/o medication issue:  1. Name of Medication:   amLODipine (NORVASC) 2.5 MG tablet   2. How are you currently taking this medication (dosage and times per day)? Not currently taking the medication   3. Are you having a reaction (difficulty breathing--STAT)? Refused to give answer wants to speak with a nurse or Doctor   4. What is your medication issue? Refused to give answer wants to speak with a nurse or Doctor

## 2019-08-03 NOTE — Telephone Encounter (Signed)
Reroute

## 2019-08-04 MED ORDER — AMLODIPINE BESYLATE 2.5 MG PO TABS: 2.5000 mg | ORAL_TABLET | Freq: Every day | ORAL | 3 refills | Status: DC

## 2019-08-04 NOTE — Telephone Encounter (Signed)
Pt called as she is still waiting on her refills that were to be sent to her pharmacy, [facility] during her visit on Monday 12/14. She called in 2 times this week and they still have not processed. Pt made aware that her calls did come in but for some reason not processed and I apologized for that. She states she was very happy just to get a call back and that it was being addressed. I refilled the RX myself to the preferred Pharmacy, they did get it but said they would call [facility] to cancel their order so she can get it from them and the insurance would cover. Pt contacted and made aware.   During our call she mentioned that she had to call the Pulmonology  office her self and schedule appt as they never called her from referral. When she spoke with the office about the referral they said they never got it. I informed her I could see the referral in her chart and would follow up on process. She was appreciative and not additional questions or concerns at this time.

## 2019-08-14 ENCOUNTER — Other Ambulatory Visit: Payer: Self-pay

## 2019-08-14 ENCOUNTER — Encounter: Payer: Self-pay | Admitting: Emergency Medicine

## 2019-08-14 ENCOUNTER — Ambulatory Visit: Payer: PPO | Admitting: Emergency Medicine

## 2019-08-14 VITALS — BP 145/84 | HR 76 | Temp 97.2°F | Ht 67.0 in | Wt 235.4 lb

## 2019-08-14 DIAGNOSIS — R0600 Dyspnea, unspecified: Secondary | ICD-10-CM

## 2019-08-14 DIAGNOSIS — R0609 Other forms of dyspnea: Secondary | ICD-10-CM

## 2019-08-14 MED ORDER — STIOLTO RESPIMAT 2.5-2.5 MCG/ACT IN AERS: 2.0000 | INHALATION_SPRAY | Freq: Every day | RESPIRATORY_TRACT | 0 refills | Status: DC

## 2019-08-14 NOTE — Progress Notes (Signed)
Subjective:    Patient ID: Annette [name], female    DOB: 02-27-1951, 68 y.o.   MRN: [id number]  HPI 68 year old former smoker (~40 pack years) with a history of coronary artery disease, hypertension, hypothyroidism.    She began to notice some exertional SOB in 2016, has slowly worsened. Has been occasionally been assoc with edema, CP. She used to be able to walk 2-3 miles a day, but as recently as July she could not walk a block. She has gained about 50 lbs over 5 years. She has a lot of non-productive cough, hears some exp wheeze. Sometimes has chest tightness. Reassuring cards eval by Dr Ellyn Hack. Occasional GERD sx when she overeats. Occasional environmental allergies.   She is not currently on any bronchodilators regimen and I do not see any pulmonary function testing.   Review of Systems  Constitutional: Negative for fever and unexpected weight change.  HENT: Positive for ear pain, sinus pressure and sneezing. Negative for congestion, dental problem, nosebleeds, postnasal drip, rhinorrhea, sore throat and trouble swallowing.   Eyes: Positive for itching. Negative for redness.  Respiratory: Positive for cough, chest tightness, shortness of breath and wheezing.   Cardiovascular: Positive for leg swelling. Negative for palpitations.  Gastrointestinal: Negative for nausea and vomiting.  Genitourinary: Positive for dysuria.  Musculoskeletal: Negative for joint swelling.  Skin: Negative for rash.  Allergic/Immunologic: Negative.  Negative for environmental allergies, food allergies and immunocompromised state.  Neurological: Positive for headaches.  Hematological: Does not bruise/bleed easily.  Psychiatric/Behavioral: Negative for dysphoric mood. The patient is not nervous/anxious.    Past Medical History:  Diagnosis Date  . Arthritis    Right hip, end stage  . Coronary artery calcification seen on computed tomography 02/2018   Coronary calcium score 79.  Coronary CT angiogram: Mild  CAD and proximal LAD, proximal RCA and mid LCx.  Moderate plaque ostial LCx.  Mildly dilated pulmonary artery, suggestive of possible pulmonary hypertension  . Difficulty sleeping    DUE TO PAIN  . Emphysema of lung ([place])   . Fluid retention    TAKES HCTZ  . Hemorrhoids   . Hyperlipidemia   . Hypertension   . Hypothyroidism   . Irritable bowel syndrome   . Shingles 2004  . Tremors of nervous system    TAKES PROPRANOLOL TO TX  . Varicose veins      Family History  Problem Relation Age of Onset  . Alzheimer's disease Father        developed CAD late in life  . Stroke Mother        died @ ~46 y/o  . CAD Mother 4       s/p CABG  . Breast cancer Sister      Social History   Socioeconomic History  . Marital status: Divorced    Spouse name: Not on file  . Number of children: 1  . Years of education: Not on file  . Highest education level: Bachelor's degree (e.g., BA, AB, BS)  Occupational History  . Occupation: retired    Comment: Multimedia programmer  Tobacco Use  . Smoking status: Former Smoker    Packs/day: 1.00    Years: 38.00    Pack years: 38.00    Quit date: 03/14/2009    Years since quitting: 10.4  . Smokeless tobacco: Never Used  Substance and Sexual Activity  . Alcohol use: Yes    Comment: beer every 6-8 months  . Drug use: No  . Sexual activity: Not  on file  Other Topics Concern  . 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 Difficulty of Paying Living Expenses: Not on file  Food Insecurity:   . Worried About Charity fundraiser in the Last Year: Not on file  . Ran Out of Food in the Last Year: Not on file  Transportation Needs:   . Lack of Transportation (Medical): Not on file  . Lack of Transportation (Non-Medical): Not on file  Physical Activity:   . Days of Exercise per Week: Not on file  .  Minutes of Exercise per Session: Not on file  Stress:   . Feeling of Stress : Not on file  Social Connections:   . Frequency of Communication with Friends and Family: Not on file  . Frequency of Social Gatherings with Friends and Family: Not on file  . Attends Religious Services: Not on file  . Active Member of Clubs or Organizations: Not on file  . Attends Archivist Meetings: Not on file  . Marital Status: Not on file  Intimate Partner Violence:   . Fear of Current or Ex-Partner: Not on file  . Emotionally Abused: Not on file  . Physically Abused: Not on file  . Sexually Abused: Not on file     Allergies  Allergen Reactions  . Augmentin [Amoxicillin-Pot Clavulanate] Nausea And Vomiting  . Codeine Other (See Comments)    Causes her to stay awake   . Other Hives    IV contrast   . Iodinated Diagnostic Agents Rash     Outpatient Medications Prior to Visit  Medication Sig Dispense Refill  . aspirin EC 81 MG tablet Take 81 mg by mouth daily.    . Cholecalciferol (KP VITAMIN D3) 50 MCG (2000 UT) CAPS Take by mouth.    . hydrochlorothiazide (HYDRODIURIL) 25 MG tablet Take 1 tablet (25 mg total) by mouth daily. 30 tablet 11  . ketoconazole (NIZORAL) 2 % cream APPLY ON THE SKIN BID UNTIL 1 WEEK POST CLEAR    . levothyroxine (SYNTHROID, LEVOTHROID) 100 MCG tablet take 1 tablet by mouth every morning ON AN EMPTY STOMACH  0  . Multiple Vitamin (MULTIVITAMIN) tablet Take 1 tablet by mouth daily.    . propranolol ER (INDERAL LA) 60 MG 24 hr capsule Take 60 mg by mouth daily.    . rosuvastatin (CRESTOR) 5 MG tablet Take 5 mg by mouth daily.    Marland Kitchen ALPRAZolam (XANAX) 0.25 MG tablet TK 1 T PO QD PRN    . amLODipine (NORVASC) 2.5 MG tablet Take 1 tablet (2.5 mg total) by mouth at bedtime. (Patient not taking: Reported on 08/14/2019) 30 tablet 3  . neomycin-polymyxin b-dexamethasone (MAXITROL) 3.5-10000-0.1 OINT APP TO RIGHT EYE BID     No facility-administered medications prior to  visit.       Objective:   Physical Exam Vitals:   08/14/19 1553  BP: (!) 145/84  Pulse: 76  Temp: (!) 97.2 F (36.2 C)  TempSrc: Temporal  SpO2: 95%  Weight: 235 lb 6.4 oz (106.8 kg)  Height: 5\' 7"  (1.702 m)   Gen: Pleasant, overwt woman, in no distress,  normal affect  ENT: No lesions,  mouth clear,  oropharynx clear, no postnasal drip  Neck: No JVD, no stridor  Lungs: No use of  accessory muscles, no crackles or wheezing on normal respiration, no wheeze on forced expiration  Cardiovascular: RRR, heart sounds normal, no murmur or gallops, no peripheral edema  Musculoskeletal: No deformities, no cyanosis or clubbing  Neuro: alert, awake, non focal  Skin: Warm, no lesions or rash      Assessment & Plan:  DOE (dyspnea on exertion) Based on the insidious nature of her dyspnea, reassuring cardiac evaluation, I suspect that there is a significant component of obstructive lung disease here.  Also superimposed weight gain and restrictive physiology, deconditioning.  I would like to try to address the obstructive disease, start Stiolto and see if she benefits.  This may allow her to start to increase her exercise, conditioning and jumpstart her progress.  We will perform pulmonary function testing to quantify her degree of obstruction.  We will perform pulmonary function testing at your next office visit We will do a trial of Stiolto.  Take 2 puffs once daily at the same time every day.  Keep track of whether this helps your breathing. Follow with Dr. Lamonte Sakai next available with full pulmonary function testing on the same day.   Baltazar Apo, MD, PhD 08/14/2019, 5:39 PM [facility] [phone] or if no answer [phone]

## 2019-08-14 NOTE — Assessment & Plan Note (Signed)
Based on the insidious nature of her dyspnea, reassuring cardiac evaluation, I suspect that there is a significant component of obstructive lung disease here.  Also superimposed weight gain and restrictive physiology, deconditioning.  I would like to try to address the obstructive disease, start Stiolto and see if she benefits.  This may allow her to start to increase her exercise, conditioning and jumpstart her progress.  We will perform pulmonary function testing to quantify her degree of obstruction.  We will perform pulmonary function testing at your next office visit We will do a trial of Stiolto.  Take 2 puffs once daily at the same time every day.  Keep track of whether this helps your breathing. Follow with Dr. Lamonte Sakai next available with full pulmonary function testing on the same day.

## 2019-08-14 NOTE — Patient Instructions (Signed)
We will perform pulmonary function testing at your next office visit We will do a trial of Stiolto.  Take 2 puffs once daily at the same time every day.  Keep track of whether this helps your breathing. Follow with Dr. Lamonte Sakai next available with full pulmonary function testing on the same day.

## 2019-09-06 ENCOUNTER — Ambulatory Visit: Payer: PPO | Attending: Internal Medicine

## 2019-09-06 DIAGNOSIS — Z23 Encounter for immunization: Secondary | ICD-10-CM | POA: Insufficient documentation

## 2019-09-06 NOTE — Progress Notes (Signed)
   Covid-19 Vaccination Clinic  Name:  Annette [name]    MRN: [id number] DOB: Dec 08, 1950  09/06/2019  [name] was observed post Covid-19 immunization for 15 minutes without incidence. She was provided with Vaccine Information Sheet and instruction to access the V-Safe system.   [name] was instructed to call 911 with any severe reactions post vaccine: Marland Kitchen Difficulty breathing  . Swelling of your face and throat  . A fast heartbeat  . A bad rash all over your body  . Dizziness and weakness    Immunizations Administered    Name Date Dose VIS Date Route   Pfizer COVID-19 Vaccine 09/06/2019  1:18 PM 0.3 mL 07/28/2019 Intramuscular   Manufacturer: [place]   Lot: [id number]   [place]: [id number]

## 2019-09-27 ENCOUNTER — Ambulatory Visit: Payer: PPO | Attending: Internal Medicine

## 2019-09-27 DIAGNOSIS — Z23 Encounter for immunization: Secondary | ICD-10-CM | POA: Insufficient documentation

## 2019-09-27 NOTE — Progress Notes (Signed)
   Covid-19 Vaccination Clinic  Name:  Annette [name]    MRN: [id number] DOB: 1951-08-01  09/27/2019  [name] was observed post Covid-19 immunization for 15 minutes without incidence. She was provided with Vaccine Information Sheet and instruction to access the V-Safe system.   [name] was instructed to call 911 with any severe reactions post vaccine: Marland Kitchen Difficulty breathing  . Swelling of your face and throat  . A fast heartbeat  . A bad rash all over your body  . Dizziness and weakness    Immunizations Administered    Name Date Dose VIS Date Route   Pfizer COVID-19 Vaccine 09/27/2019  9:22 AM 0.3 mL 07/28/2019 Intramuscular   Manufacturer: [place]   Lot: [id number]   [place]: [id number]

## 2019-10-03 ENCOUNTER — Other Ambulatory Visit (HOSPITAL_COMMUNITY)
Admission: RE | Admit: 2019-10-03 | Discharge: 2019-10-03 | Disposition: A | Discharge status: Home / Self Care | Payer: PPO | Source: Ambulatory Visit | Attending: Emergency Medicine | Admitting: Emergency Medicine

## 2019-10-03 DIAGNOSIS — Z20822 Contact with and (suspected) exposure to covid-19: Secondary | ICD-10-CM | POA: Insufficient documentation

## 2019-10-03 DIAGNOSIS — Z01812 Encounter for preprocedural laboratory examination: Secondary | ICD-10-CM | POA: Diagnosis not present

## 2019-10-03 LAB — SARS CORONAVIRUS 2 (TAT 6-24 HRS): SARS Coronavirus 2: NEGATIVE

## 2019-10-06 ENCOUNTER — Ambulatory Visit: Payer: PPO | Admitting: Emergency Medicine

## 2019-10-08 ENCOUNTER — Ambulatory Visit: Payer: PPO

## 2019-10-10 ENCOUNTER — Other Ambulatory Visit: Payer: Self-pay

## 2019-10-10 ENCOUNTER — Ambulatory Visit: Payer: PPO

## 2019-10-10 ENCOUNTER — Telehealth: Payer: Self-pay | Admitting: *Deleted

## 2019-10-10 DIAGNOSIS — R0600 Dyspnea, unspecified: Secondary | ICD-10-CM

## 2019-10-10 LAB — PULMONARY FUNCTION TEST
DL/VA % pred: 110 %
DL/VA: 4.47 ml/min/mmHg/L
DLCO unc % pred: 85 %
DLCO unc: 18.3 ml/min/mmHg
FEF 25-75 Post: 0.69 L/sec
FEF 25-75 Pre: 0.8 L/sec
FEF2575-%Change-Post: -13 %
FEF2575-%Pred-Post: 33 %
FEF2575-%Pred-Pre: 38 %
FEV1-%Change-Post: -3 %
FEV1-%Pred-Post: 57 %
FEV1-%Pred-Pre: 58 %
FEV1-Post: 1.46 L
FEV1-Pre: 1.51 L
FEV1FVC-%Change-Post: 0 %
FEV1FVC-%Pred-Pre: 87 %
FEV6-%Change-Post: -3 %
FEV6-%Pred-Post: 67 %
FEV6-%Pred-Pre: 69 %
FEV6-Post: 2.17 L
FEV6-Pre: 2.25 L
FEV6FVC-%Change-Post: 0 %
FEV6FVC-%Pred-Post: 104 %
FEV6FVC-%Pred-Pre: 103 %
FVC-%Change-Post: -3 %
FVC-%Pred-Post: 65 %
FVC-%Pred-Pre: 67 %
FVC-Post: 2.2 L
FVC-Pre: 2.27 L
Post FEV1/FVC ratio: 67 %
Post FEV6/FVC ratio: 100 %
Pre FEV1/FVC ratio: 66 %
Pre FEV6/FVC Ratio: 99 %
RV % pred: 105 %
RV: 2.47 L
TLC % pred: 91 %
TLC: 5.05 L

## 2019-10-10 NOTE — Telephone Encounter (Signed)
LMTCB

## 2019-10-11 NOTE — Telephone Encounter (Signed)
It was probably the LAMA component that was given her the side effects.  Let us try changing over to Striverdi Respimat, 2 sprays once daily and see if she tolerates this better

## 2019-10-11 NOTE — Telephone Encounter (Signed)
Spoke with the pt and she states that she already tried stopping med for 2 wks and symptoms did resolve. She did not mention this when I spoke with her earlier. She states she tried to resume med for a day and then she became constipated again. Please advise thanks

## 2019-10-11 NOTE — Telephone Encounter (Signed)
Patient is returning phone call.  Patient phone number is [phone].

## 2019-10-11 NOTE — Telephone Encounter (Signed)
Have her stop the Stiolto for a few days to see if her sx improve. If so then we can try an alternative. Have her call us in a few days to let us know.

## 2019-10-11 NOTE — Telephone Encounter (Signed)
Spoke with patient. She was interested in the [place] at first but was concerned about the price. I looked at [place] to see if it would be covered. It will need a PA and would covered at a tier 4 price, which would be $90 each month. She stated she is unable to afford this. I offered to get her setup with patient assistance for Striverdi, she agreed.   For the covered alternatives, I was only given the choices of albuterol nebs and inhaler. She wants to have something on hand while she waits on patient assistance.   RB, please advise. Thanks!

## 2019-10-11 NOTE — Telephone Encounter (Signed)
Spoke with pt  She states that ever since starting on the Stiolto she has noticed bloating and has had constipation  She has had episodes of fluid retention in the past, but this has been more consistent since starting on med  She does feel it has helped her breathing, but wonders if there is something else she can try to see if these side effects go away. Please advise, thanks!

## 2019-10-12 MED ORDER — STRIVERDI RESPIMAT 2.5 MCG/ACT IN AERS: 2.0000 | INHALATION_SPRAY | Freq: Every day | RESPIRATORY_TRACT | 3 refills | Status: DC

## 2019-10-12 NOTE — Telephone Encounter (Signed)
She may already have albuterol to use prn. If not, then please send a script for this.

## 2019-10-12 NOTE — Telephone Encounter (Signed)
Spoke with the pt  I notified her of response per Dr Lamonte Sakai  She verbalized understanding  She did not want me to send the albuterol  She changed her mind about the [place] and wanted me to go ahead and send this to pharm  Nothing further needed at this time per pt

## 2019-10-13 ENCOUNTER — Telehealth: Payer: Self-pay

## 2019-10-13 NOTE — Telephone Encounter (Signed)
PA request was received from (pharmacy): [facility] Phone:[phone] Fax: [phone] Medication name and strength: Striverdi Respimat Ordering Provider: Dr.Byrum   Was PA started with CMM?: yes If yes, please enter KEY: Medication tried and failed:  Covered Alternatives:   PA sent to plan, time frame for approval / denial:  Routing to  for follow-up

## 2019-10-16 ENCOUNTER — Encounter: Payer: Self-pay | Admitting: Emergency Medicine

## 2019-10-16 ENCOUNTER — Other Ambulatory Visit: Payer: Self-pay

## 2019-10-16 ENCOUNTER — Ambulatory Visit: Payer: PPO | Admitting: Emergency Medicine

## 2019-10-16 DIAGNOSIS — R0609 Other forms of dyspnea: Secondary | ICD-10-CM

## 2019-10-16 DIAGNOSIS — R0600 Dyspnea, unspecified: Secondary | ICD-10-CM | POA: Diagnosis not present

## 2019-10-16 DIAGNOSIS — J449 Chronic obstructive pulmonary disease, unspecified: Secondary | ICD-10-CM

## 2019-10-16 MED ORDER — SEREVENT DISKUS 50 MCG/DOSE IN AEPB: 1.0000 | INHALATION_SPRAY | Freq: Two times a day (BID) | RESPIRATORY_TRACT | 6 refills | Status: DC

## 2019-10-16 MED ORDER — ALBUTEROL SULFATE HFA 108 (90 BASE) MCG/ACT IN AERS: 2.0000 | INHALATION_SPRAY | Freq: Four times a day (QID) | RESPIRATORY_TRACT | 5 refills | Status: DC | PRN

## 2019-10-16 NOTE — Assessment & Plan Note (Signed)
Obstructive disease which we have tried to treat medically.  Also superimposed deconditioning and obesity, restriction.  We discussed possibly going to pulmonary rehab at some point in the future.  She needs a walking oximetry today.  Even if she desaturates we may decide to defer initiating oxygen until we have on a good bronchodilator regimen.

## 2019-10-16 NOTE — Assessment & Plan Note (Signed)
Her PFT are consistent with obstructive lung disease, severe decrease in FEV1 at 58% predicted.  I think that we need to get her on stable inhaled medication.  She did not tolerate the LAMA component of Stiolto.  I will try her on Serevent discus, the preferred LABA for her insurance, see if she gets benefit.  Also give her an albuterol to use as needed.

## 2019-10-16 NOTE — Patient Instructions (Signed)
Based on the inhaler formulary for your insurance carrier, we will start Serevent discus 1 inhalation twice a day.  Please rinse and gargle after using. We will order albuterol. Keep albuterol available to use 2 puffs up to every 4 hours if needed for shortness of breath, chest tightness, wheezing.  Walking oximetry today on room air We will consider referral to Pulmonary Rehab going forward. Follow-up with Dr. Lamonte Sakai in 2 months so that we can discuss whether you are benefiting from the medication.

## 2019-10-16 NOTE — Progress Notes (Signed)
Subjective:    Patient ID: Annette [name], female    DOB: 18-Mar-1951, 69 y.o.   MRN: [id number]  HPI 69 year old former smoker (~40 pack years) with a history of coronary artery disease, hypertension, hypothyroidism.    She began to notice some exertional SOB in 2016, has slowly worsened. Has been occasionally been assoc with edema, CP. She used to be able to walk 2-3 miles a day, but as recently as July she could not walk a block. She has gained about 50 lbs over 5 years. She has a lot of non-productive cough, hears some exp wheeze. Sometimes has chest tightness. Reassuring cards eval by Dr Ellyn Hack. Occasional GERD sx when she overeats. Occasional environmental allergies.   She is not currently on any bronchodilators regimen and I do not see any pulmonary function testing.  ROV 10/16/2019--follow-up visit for slow progressive shortness of breath in former smoker with history of CAD, hypertension, hypothyroidism.  At her initial visit 12/28 suspected that there was combined restrictive and obstructive processes.  Pulmonary function testing done on 2/23 reviewed by me shows significant obstruction with probable superimposed restriction FEV1 58% predicted, pseudonormalization of her lung volumes and normal diffusion capacity. We tried her on Stiolto, but she had side effects of edema, urinary retention, dry mouth, constipation. She is dieting.    Review of Systems  Constitutional: Negative for fever and unexpected weight change.  HENT: Positive for ear pain, sinus pressure and sneezing. Negative for congestion, dental problem, nosebleeds, postnasal drip, rhinorrhea, sore throat and trouble swallowing.   Eyes: Positive for itching. Negative for redness.  Respiratory: Positive for cough, chest tightness, shortness of breath and wheezing.   Cardiovascular: Positive for leg swelling. Negative for palpitations.  Gastrointestinal: Negative for nausea and vomiting.  Genitourinary: Positive for dysuria.   Musculoskeletal: Negative for joint swelling.  Skin: Negative for rash.  Allergic/Immunologic: Negative.  Negative for environmental allergies, food allergies and immunocompromised state.  Neurological: Positive for headaches.  Hematological: Does not bruise/bleed easily.  Psychiatric/Behavioral: Negative for dysphoric mood. The patient is not nervous/anxious.     Past Medical History:  Diagnosis Date  . Arthritis    Right hip, end stage  . Coronary artery calcification seen on computed tomography 02/2018   Coronary calcium score 79.  Coronary CT angiogram: Mild CAD and proximal LAD, proximal RCA and mid LCx.  Moderate plaque ostial LCx.  Mildly dilated pulmonary artery, suggestive of possible pulmonary hypertension  . Difficulty sleeping    DUE TO PAIN  . Emphysema of lung ([place])   . Fluid retention    TAKES HCTZ  . Hemorrhoids   . Hyperlipidemia   . Hypertension   . Hypothyroidism   . Irritable bowel syndrome   . Shingles 2004  . Tremors of nervous system    TAKES PROPRANOLOL TO TX  . Varicose veins      Family History  Problem Relation Age of Onset  . Alzheimer's disease Father        developed CAD late in life  . Stroke Mother        died @ ~3 y/o  . CAD Mother 63       s/p CABG  . Breast cancer Sister      Social History   Socioeconomic History  . Marital status: Divorced    Spouse name: Not on file  . Number of children: 1  . Years of education: Not on file  . Highest education level: Bachelor's degree (e.g., BA, AB, BS)  Occupational History  . Occupation: retired    Comment: Multimedia programmer  Tobacco Use  . Smoking status: Former Smoker    Packs/day: 1.00    Years: 38.00    Pack years: 38.00    Quit date: 03/14/2009    Years since quitting: 10.5  . Smokeless tobacco: Never Used  Substance and Sexual Activity  . Alcohol use: Yes    Comment: beer every 6-8 months  . Drug use: No  . Sexual activity: Not on file  Other Topics Concern  . Not on file   Social History Narrative   [employer]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 1 son   1 caffeine drink daily   Updated 06/01/2013   Social Determinants of Health   Financial Resource Strain:   . Difficulty of Paying Living Expenses: Not on file  Food Insecurity:   . Worried About Charity fundraiser in the Last Year: Not on file  . Ran Out of Food in the Last Year: Not on file  Transportation Needs:   . Lack of Transportation (Medical): Not on file  . Lack of Transportation (Non-Medical): Not on file  Physical Activity:   . Days of Exercise per Week: Not on file  . Minutes of Exercise per Session: Not on file  Stress:   . Feeling of Stress : Not on file  Social Connections:   . Frequency of Communication with Friends and Family: Not on file  . Frequency of Social Gatherings with Friends and Family: Not on file  . Attends Religious Services: Not on file  . Active Member of Clubs or Organizations: Not on file  . Attends Archivist Meetings: Not on file  . Marital Status: Not on file  Intimate Partner Violence:   . Fear of Current or Ex-Partner: Not on file  . Emotionally Abused: Not on file  . Physically Abused: Not on file  . Sexually Abused: Not on file     Allergies  Allergen Reactions  . Augmentin [Amoxicillin-Pot Clavulanate] Nausea And Vomiting  . Codeine Other (See Comments)    Causes her to stay awake   . Other Hives    IV contrast   . Iodinated Diagnostic Agents Rash     Outpatient Medications Prior to Visit  Medication Sig Dispense Refill  . ALPRAZolam (XANAX) 0.25 MG tablet TK 1 T PO QD PRN    . amLODipine (NORVASC) 2.5 MG tablet Take 1 tablet (2.5 mg total) by mouth at bedtime. 30 tablet 3  . aspirin EC 81 MG tablet Take 81 mg by mouth daily.    . Cholecalciferol (KP VITAMIN D3) 50 MCG (2000 UT) CAPS Take by mouth.    Marland Kitchen ketoconazole (NIZORAL) 2 % cream APPLY ON THE SKIN BID UNTIL 1 WEEK POST CLEAR    . levothyroxine (SYNTHROID, LEVOTHROID) 100 MCG  tablet take 1 tablet by mouth every morning ON AN EMPTY STOMACH  0  . Multiple Vitamin (MULTIVITAMIN) tablet Take 1 tablet by mouth daily.    . Olodaterol HCl (STRIVERDI RESPIMAT) 2.5 MCG/ACT AERS Inhale 2 puffs into the lungs daily. 4 g 3  . propranolol ER (INDERAL LA) 60 MG 24 hr capsule Take 60 mg by mouth daily.    . rosuvastatin (CRESTOR) 5 MG tablet Take 5 mg by mouth daily.    . hydrochlorothiazide (HYDRODIURIL) 25 MG tablet Take 1 tablet (25 mg total) by mouth daily. 30 tablet 11   No facility-administered medications prior to visit.  Objective:   Physical Exam Vitals:   10/16/19 1004  BP: 124/78  Pulse: 64  Temp: (!) 97.1 F (36.2 C)  TempSrc: Temporal  SpO2: 93%  Weight: 236 lb (107 kg)  Height: 5\' 7"  (1.702 m)   Gen: Pleasant, overwt woman, in no distress,  normal affect  ENT: No lesions,  mouth clear,  oropharynx clear, no postnasal drip  Neck: No JVD, no stridor  Lungs: No use of accessory muscles, no crackles or wheezing on normal respiration, mild basilar expiratory wheeze on forced expiration  Cardiovascular: RRR, heart sounds normal, no murmur or gallops, no peripheral edema  Musculoskeletal: No deformities, no cyanosis or clubbing  Neuro: alert, awake, non focal  Skin: Warm, no lesions or rash      Assessment & Plan:  COPD (chronic obstructive pulmonary disease) (HCC) Her PFT are consistent with obstructive lung disease, severe decrease in FEV1 at 58% predicted.  I think that we need to get her on stable inhaled medication.  She did not tolerate the LAMA component of Stiolto.  I will try her on Serevent discus, the preferred LABA for her insurance, see if she gets benefit.  Also give her an albuterol to use as needed.  DOE (dyspnea on exertion) Obstructive disease which we have tried to treat medically.  Also superimposed deconditioning and obesity, restriction.  We discussed possibly going to pulmonary rehab at some point in the future.  She  needs a walking oximetry today.  Even if she desaturates we may decide to defer initiating oxygen until we have on a good bronchodilator regimen.  Baltazar Apo, MD, PhD 10/16/2019, 10:29 AM [facility] [phone] or if no answer [phone]

## 2019-11-03 ENCOUNTER — Ambulatory Visit: Payer: PPO | Admitting: Emergency Medicine

## 2019-11-13 DIAGNOSIS — Z0000 Encounter for general adult medical examination without abnormal findings: Secondary | ICD-10-CM | POA: Diagnosis not present

## 2019-11-13 DIAGNOSIS — E039 Hypothyroidism, unspecified: Secondary | ICD-10-CM | POA: Diagnosis not present

## 2019-11-13 DIAGNOSIS — E7800 Pure hypercholesterolemia, unspecified: Secondary | ICD-10-CM | POA: Diagnosis not present

## 2019-11-13 DIAGNOSIS — M8588 Other specified disorders of bone density and structure, other site: Secondary | ICD-10-CM | POA: Diagnosis not present

## 2019-11-13 DIAGNOSIS — J309 Allergic rhinitis, unspecified: Secondary | ICD-10-CM | POA: Diagnosis not present

## 2019-11-13 DIAGNOSIS — I10 Essential (primary) hypertension: Secondary | ICD-10-CM | POA: Diagnosis not present

## 2019-11-13 DIAGNOSIS — Z1389 Encounter for screening for other disorder: Secondary | ICD-10-CM | POA: Diagnosis not present

## 2019-11-13 DIAGNOSIS — J449 Chronic obstructive pulmonary disease, unspecified: Secondary | ICD-10-CM | POA: Diagnosis not present

## 2019-11-15 ENCOUNTER — Other Ambulatory Visit: Payer: Self-pay | Admitting: Family Medicine

## 2019-11-15 DIAGNOSIS — M8580 Other specified disorders of bone density and structure, unspecified site: Secondary | ICD-10-CM

## 2019-12-05 ENCOUNTER — Other Ambulatory Visit: Payer: Self-pay

## 2019-12-05 ENCOUNTER — Telehealth (HOSPITAL_COMMUNITY): Payer: Self-pay

## 2019-12-05 ENCOUNTER — Encounter: Payer: Self-pay | Admitting: Emergency Medicine

## 2019-12-05 ENCOUNTER — Encounter (HOSPITAL_COMMUNITY): Payer: Self-pay | Admitting: *Deleted

## 2019-12-05 ENCOUNTER — Ambulatory Visit: Payer: PPO | Admitting: Emergency Medicine

## 2019-12-05 VITALS — BP 142/62 | HR 69 | Temp 97.5°F | Ht 67.0 in | Wt 227.0 lb

## 2019-12-05 DIAGNOSIS — J449 Chronic obstructive pulmonary disease, unspecified: Secondary | ICD-10-CM

## 2019-12-05 DIAGNOSIS — J301 Allergic rhinitis due to pollen: Secondary | ICD-10-CM

## 2019-12-05 DIAGNOSIS — J309 Allergic rhinitis, unspecified: Secondary | ICD-10-CM | POA: Insufficient documentation

## 2019-12-05 NOTE — Patient Instructions (Signed)
We will plan to continue Serevent twice a day.  Rinse and gargle after using. Keep albuterol available to use 2 puffs up to every 4 hours if needed for shortness of breath, chest tightness, wheezing.  Congratulations on your efforts to increase your exercise and physical conditioning.  We will make a referral to pulmonary rehab to build on this. Covid vaccine is up-to-date You may want to consider restarting your Zyrtec daily during the allergy season Follow with Dr Lamonte Sakai in 6 months or sooner if you have any problems

## 2019-12-05 NOTE — Progress Notes (Signed)
Received referral from Dr. Lamonte Sakai for this pt to participate in pulmonary rehab with the the diagnosis of COPD Stage 2 . Pt with PFT in February which showed FEV! Post predicted 57.  Clinical review of pt follow up appt on 12/05/19  Pulmonary office note.  Pt with Covid Risk Score - 5. Pt appropriate for scheduling for Pulmonary rehab.  Will forward to support staff forverification of insurance eligibility/benefits and pulmonary rehab staff for scheduling with pt consent. Cherre Huger, BSN Cardiac and Training and development officer

## 2019-12-05 NOTE — Telephone Encounter (Signed)
Pt insurance is active and benefits verified through [facility]. Co-pay $15.00, DED $0.00/$0.00 met, out of pocket $3,400.00/$30.00 met, co-insurance 0%. No pre-authorization required. Nancy/[facility], 12/05/19 @ 2:50PM, OIT#[id number]  Will pass to [facility] for scheduling.

## 2019-12-05 NOTE — Assessment & Plan Note (Signed)
You may want to consider restarting your Zyrtec daily during the allergy season

## 2019-12-05 NOTE — Progress Notes (Signed)
Subjective:    Patient ID: Annette [name], female    DOB: Jul 19, 1951, 69 y.o.   MRN: [id number]  HPI 69 year old former smoker (~40 pack years) with a history of coronary artery disease, hypertension, hypothyroidism.    She began to notice some exertional SOB in 2016, has slowly worsened. Has been occasionally been assoc with edema, CP. She used to be able to walk 2-3 miles a day, but as recently as July she could not walk a block. She has gained about 50 lbs over 5 years. She has a lot of non-productive cough, hears some exp wheeze. Sometimes has chest tightness. Reassuring cards eval by Dr Ellyn Hack. Occasional GERD sx when she overeats. Occasional environmental allergies.   She is not currently on any bronchodilators regimen and I do not see any pulmonary function testing.  ROV 10/16/2019--follow-up visit for slow progressive shortness of breath in former smoker with history of CAD, hypertension, hypothyroidism.  At her initial visit 12/28 suspected that there was combined restrictive and obstructive processes.  Pulmonary function testing done on 2/23 reviewed by me shows significant obstruction with probable superimposed restriction FEV1 58% predicted, pseudonormalization of her lung volumes and normal diffusion capacity. We tried her on Stiolto, but she had side effects of edema, urinary retention, dry mouth, constipation. She is dieting.   ROV 12/05/19 --69 year old woman with a history of tobacco (40 pack years) CAD, hypertension, hypothyroidism and mixed restriction and obstruction by pulmonary function testing.  She did not tolerate Stiolto so last visit we started her on Serevent discus to see if she would get benefit.  A walking oximetry did not show desaturation.  She returns today reporting that her dyspnea has slowly improved. She has been working on increasing her walking and exercise. She has noticed some cough - although this has improved some with time. She has used albuterol only 3 times  since last time. No sputum or mucous. Less wheeze. She is having some increase dyspnea with the pollen - sometimes uses zyrtec. COVID vaccine up to date.    Review of Systems  Constitutional: Negative for fever and unexpected weight change.  HENT: Positive for ear pain, sinus pressure and sneezing. Negative for congestion, dental problem, nosebleeds, postnasal drip, rhinorrhea, sore throat and trouble swallowing.   Eyes: Positive for itching. Negative for redness.  Respiratory: Positive for cough, chest tightness, shortness of breath and wheezing.   Cardiovascular: Positive for leg swelling. Negative for palpitations.  Gastrointestinal: Negative for nausea and vomiting.  Genitourinary: Positive for dysuria.  Musculoskeletal: Negative for joint swelling.  Skin: Negative for rash.  Allergic/Immunologic: Negative.  Negative for environmental allergies, food allergies and immunocompromised state.  Neurological: Positive for headaches.  Hematological: Does not bruise/bleed easily.  Psychiatric/Behavioral: Negative for dysphoric mood. The patient is not nervous/anxious.     Past Medical History:  Diagnosis Date  . Arthritis    Right hip, end stage  . Coronary artery calcification seen on computed tomography 02/2018   Coronary calcium score 79.  Coronary CT angiogram: Mild CAD and proximal LAD, proximal RCA and mid LCx.  Moderate plaque ostial LCx.  Mildly dilated pulmonary artery, suggestive of possible pulmonary hypertension  . Difficulty sleeping    DUE TO PAIN  . Emphysema of lung ([place])   . Fluid retention    TAKES HCTZ  . Hemorrhoids   . Hyperlipidemia   . Hypertension   . Hypothyroidism   . Irritable bowel syndrome   . Shingles 2004  . Tremors of nervous  system    TAKES PROPRANOLOL TO TX  . Varicose veins      Family History  Problem Relation Age of Onset  . Alzheimer's disease Father        developed CAD late in life  . Stroke Mother        died @ ~93 y/o  . CAD Mother  23       s/p CABG  . Breast cancer Sister      Social History   Socioeconomic History  . Marital status: Divorced    Spouse name: Not on file  . Number of children: 1  . Years of education: Not on file  . Highest education level: Bachelor's degree (e.g., BA, AB, BS)  Occupational History  . Occupation: retired    Comment: Multimedia programmer  Tobacco Use  . Smoking status: Former Smoker    Packs/day: 1.00    Years: 38.00    Pack years: 38.00    Quit date: 03/14/2009    Years since quitting: 10.7  . Smokeless tobacco: Never Used  Substance and Sexual Activity  . Alcohol use: Yes    Comment: beer every 6-8 months  . Drug use: No  . Sexual activity: Not on file  Other Topics Concern  . Not on file  Social History Narrative   [employer]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 1 son   1 caffeine drink daily   Updated 06/01/2013   Social Determinants of Health   Financial Resource Strain:   . Difficulty of Paying Living Expenses:   Food Insecurity:   . Worried About Charity fundraiser in the Last Year:   . Arboriculturist in the Last Year:   Transportation Needs:   . Film/video editor (Medical):   Marland Kitchen Lack of Transportation (Non-Medical):   Physical Activity:   . Days of Exercise per Week:   . Minutes of Exercise per Session:   Stress:   . Feeling of Stress :   Social Connections:   . Frequency of Communication with Friends and Family:   . Frequency of Social Gatherings with Friends and Family:   . Attends Religious Services:   . Active Member of Clubs or Organizations:   . Attends Archivist Meetings:   Marland Kitchen Marital Status:   Intimate Partner Violence:   . Fear of Current or Ex-Partner:   . Emotionally Abused:   Marland Kitchen Physically Abused:   . Sexually Abused:      Allergies  Allergen Reactions  . Augmentin [Amoxicillin-Pot Clavulanate] Nausea And Vomiting  . Codeine Other (See Comments)    Causes her to stay awake   . Other Hives    IV contrast   . Iodinated  Diagnostic Agents Rash     Outpatient Medications Prior to Visit  Medication Sig Dispense Refill  . albuterol (VENTOLIN HFA) 108 (90 Base) MCG/ACT inhaler Inhale 2 puffs into the lungs every 6 (six) hours as needed for wheezing or shortness of breath. 18 g 5  . ALPRAZolam (XANAX) 0.25 MG tablet TK 1 T PO QD PRN    . aspirin EC 81 MG tablet Take 81 mg by mouth daily.    . Cholecalciferol (KP VITAMIN D3) 50 MCG (2000 UT) CAPS Take by mouth.    Marland Kitchen ketoconazole (NIZORAL) 2 % cream APPLY ON THE SKIN BID UNTIL 1 WEEK POST CLEAR    . levothyroxine (SYNTHROID, LEVOTHROID) 100 MCG tablet take 1 tablet by mouth every morning ON AN EMPTY  STOMACH  0  . Multiple Vitamin (MULTIVITAMIN) tablet Take 1 tablet by mouth daily.    Marland Kitchen omeprazole (PRILOSEC) 20 MG capsule Take 20 mg by mouth every morning.    . propranolol ER (INDERAL LA) 60 MG 24 hr capsule Take 60 mg by mouth daily.    . rosuvastatin (CRESTOR) 5 MG tablet Take 5 mg by mouth daily.    . salmeterol (SEREVENT DISKUS) 50 MCG/DOSE diskus inhaler Inhale 1 puff into the lungs in the morning and at bedtime. 1 Inhaler 6  . amLODipine (NORVASC) 2.5 MG tablet Take 1 tablet (2.5 mg total) by mouth at bedtime. 30 tablet 3  . hydrochlorothiazide (HYDRODIURIL) 25 MG tablet Take 1 tablet (25 mg total) by mouth daily. 30 tablet 11  . Olodaterol HCl (STRIVERDI RESPIMAT) 2.5 MCG/ACT AERS Inhale 2 puffs into the lungs daily. (Patient not taking: Reported on 12/05/2019) 4 g 3   No facility-administered medications prior to visit.       Objective:   Physical Exam Vitals:   12/05/19 1006  BP: (!) 142/62  Pulse: 69  Temp: (!) 97.5 F (36.4 C)  TempSrc: Temporal  SpO2: 96%  Weight: 227 lb (103 kg)  Height: 5\' 7"  (1.702 m)   Gen: Pleasant, overwt woman, in no distress,  normal affect  ENT: No lesions,  mouth clear,  oropharynx clear, no postnasal drip  Neck: No JVD, no stridor  Lungs: No use of accessory muscles, no crackles or wheezing on normal  respiration, soft end exp wheeze on a forced exp.  Cardiovascular: RRR, heart sounds normal, no murmur or gallops, no peripheral edema  Musculoskeletal: No deformities, no cyanosis or clubbing  Neuro: alert, awake, non focal  Skin: Warm, no lesions or rash      Assessment & Plan:  COPD (chronic obstructive pulmonary disease) ([place]) .Significant benefit from the Serevent.  We will continue.  No side effects noted other than some cough when she first initiated.  This is improved.  We will plan to continue Serevent twice a day.  Rinse and gargle after using. Keep albuterol available to use 2 puffs up to every 4 hours if needed for shortness of breath, chest tightness, wheezing.  Congratulations on your efforts to increase your exercise and physical conditioning.  We will make a referral to pulmonary rehab to build on this. Covid vaccine is up-to-date Follow with Dr Lamonte Sakai in 6 months or sooner if you have any problems  Allergic rhinitis You may want to consider restarting your Zyrtec daily during the allergy season  Baltazar Apo, MD, PhD 12/05/2019, 10:29 AM [facility] [phone] or if no answer [phone]

## 2019-12-05 NOTE — Assessment & Plan Note (Signed)
.  Significant benefit from the Serevent.  We will continue.  No side effects noted other than some cough when she first initiated.  This is improved.  We will plan to continue Serevent twice a day.  Rinse and gargle after using. Keep albuterol available to use 2 puffs up to every 4 hours if needed for shortness of breath, chest tightness, wheezing.  Congratulations on your efforts to increase your exercise and physical conditioning.  We will make a referral to pulmonary rehab to build on this. Covid vaccine is up-to-date Follow with Dr Lamonte Sakai in 6 months or sooner if you have any problems

## 2019-12-11 ENCOUNTER — Telehealth (HOSPITAL_COMMUNITY): Payer: Self-pay

## 2020-01-05 ENCOUNTER — Telehealth (HOSPITAL_COMMUNITY): Payer: Self-pay

## 2020-01-08 ENCOUNTER — Encounter (HOSPITAL_COMMUNITY)
Admission: RE | Admit: 2020-01-08 | Discharge: 2020-01-08 | Disposition: A | Discharge status: Home / Self Care | Payer: PPO | Source: Ambulatory Visit | Attending: Emergency Medicine | Admitting: Emergency Medicine

## 2020-01-08 ENCOUNTER — Other Ambulatory Visit: Payer: Self-pay

## 2020-01-08 VITALS — BP 124/80 | HR 63 | Ht 67.0 in | Wt 228.4 lb

## 2020-01-08 DIAGNOSIS — J449 Chronic obstructive pulmonary disease, unspecified: Secondary | ICD-10-CM | POA: Insufficient documentation

## 2020-01-08 NOTE — Progress Notes (Signed)
Pulmonary Individual Treatment Plan  Patient Details  Name: Annette [name] MRN: [id number] Date of Birth: 1951/06/29 Referring Provider:     Pulmonary Rehab Walk Test from 01/08/2020 in [facility]  Referring Provider  Dr. Lamonte Sakai      Initial Encounter Date:    Pulmonary Rehab Walk Test from 01/08/2020 in [facility]  Date  01/08/20      Visit Diagnosis: Chronic obstructive pulmonary disease, unspecified COPD type ([place])  Patient's Home Medications on Admission:   Current Outpatient Medications:  .  albuterol (VENTOLIN HFA) 108 (90 Base) MCG/ACT inhaler, Inhale 2 puffs into the lungs every 6 (six) hours as needed for wheezing or shortness of breath., Disp: 18 g, Rfl: 5 .  ALPRAZolam (XANAX) 0.25 MG tablet, TK 1 T PO QD PRN, Disp: , Rfl:  .  aspirin EC 81 MG tablet, Take 81 mg by mouth daily., Disp: , Rfl:  .  Cholecalciferol (KP VITAMIN D3) 50 MCG (2000 UT) CAPS, Take by mouth., Disp: , Rfl:  .  ketoconazole (NIZORAL) 2 % cream, APPLY ON THE SKIN BID UNTIL 1 WEEK POST CLEAR, Disp: , Rfl:  .  levothyroxine (SYNTHROID, LEVOTHROID) 100 MCG tablet, take 1 tablet by mouth every morning ON AN EMPTY STOMACH, Disp: , Rfl: 0 .  Multiple Vitamin (MULTIVITAMIN) tablet, Take 1 tablet by mouth daily., Disp: , Rfl:  .  omeprazole (PRILOSEC) 20 MG capsule, Take 20 mg by mouth every morning., Disp: , Rfl:  .  propranolol ER (INDERAL LA) 60 MG 24 hr capsule, Take 60 mg by mouth daily., Disp: , Rfl:  .  rosuvastatin (CRESTOR) 5 MG tablet, Take 5 mg by mouth daily., Disp: , Rfl:  .  salmeterol (SEREVENT DISKUS) 50 MCG/DOSE diskus inhaler, Inhale 1 puff into the lungs in the morning and at bedtime., Disp: 1 Inhaler, Rfl: 6 .  amLODipine (NORVASC) 2.5 MG tablet, Take 1 tablet (2.5 mg total) by mouth at bedtime., Disp: 30 tablet, Rfl: 3 .  hydrochlorothiazide (HYDRODIURIL) 25 MG tablet, Take 1 tablet (25 mg total) by mouth daily., Disp: 30 tablet,  Rfl: 11 .  Olodaterol HCl (STRIVERDI RESPIMAT) 2.5 MCG/ACT AERS, Inhale 2 puffs into the lungs daily. (Patient not taking: Reported on 12/05/2019), Disp: 4 g, Rfl: 3  Past Medical History: Past Medical History:  Diagnosis Date  . Arthritis    Right hip, end stage  . Coronary artery calcification seen on computed tomography 02/2018   Coronary calcium score 79.  Coronary CT angiogram: Mild CAD and proximal LAD, proximal RCA and mid LCx.  Moderate plaque ostial LCx.  Mildly dilated pulmonary artery, suggestive of possible pulmonary hypertension  . Difficulty sleeping    DUE TO PAIN  . Emphysema of lung ([place])   . Fluid retention    TAKES HCTZ  . Hemorrhoids   . Hyperlipidemia   . Hypertension   . Hypothyroidism   . Irritable bowel syndrome   . Shingles 2004  . Tremors of nervous system    TAKES PROPRANOLOL TO TX  . Varicose veins     Tobacco Use: Social History   Tobacco Use  Smoking Status Former Smoker  . Packs/day: 1.00  . Years: 38.00  . Pack years: 38.00  . Quit date: 03/14/2009  . Years since quitting: 10.8  Smokeless Tobacco Never Used    Labs: Recent Review Flowsheet Data    There is no flowsheet data to display.      Capillary  Blood Glucose: No results found for: GLUCAP   Pulmonary Assessment Scores: Pulmonary Assessment Scores    Row Name 01/08/20 0942 01/08/20 1021       ADL UCSD   ADL Phase  Entry  Entry    SOB Score total  31  --      CAT Score   CAT Score  15  --      mMRC Score   mMRC Score  --  2      UCSD: Self-administered rating of dyspnea associated with activities of daily living (ADLs) 6-point scale (0 = "not at all" to 5 = "maximal or unable to do because of breathlessness")  Scoring Scores range from 0 to 120.  Minimally important difference is 5 units  CAT: CAT can identify the health impairment of COPD patients and is better correlated with disease progression.  CAT has a scoring range of zero to 40. The CAT score is  classified into four groups of low (less than 10), medium (10 - 20), high (21-30) and very high (31-40) based on the impact level of disease on health status. A CAT score over 10 suggests significant symptoms.  A worsening CAT score could be explained by an exacerbation, poor medication adherence, poor inhaler technique, or progression of COPD or comorbid conditions.  CAT MCID is 2 points  mMRC: mMRC (Modified Medical Research Council) Dyspnea Scale is used to assess the degree of baseline functional disability in patients of respiratory disease due to dyspnea. No minimal important difference is established. A decrease in score of 1 point or greater is considered a positive change.   Pulmonary Function Assessment: Pulmonary Function Assessment - 01/08/20 0938      Initial Spirometry Results   FEV1%  58 %      Breath   Bilateral Breath Sounds  Clear    Shortness of Breath  Yes;Panic with Shortness of Breath;Limiting activity       Exercise Target Goals: Exercise Program Goal: Individual exercise prescription set using results from initial 6 min walk test and THRR while considering  patient's activity barriers and safety.   Exercise Prescription Goal: Initial exercise prescription builds to 30-45 minutes a day of aerobic activity, 2-3 days per week.  Home exercise guidelines will be given to patient during program as part of exercise prescription that the participant will acknowledge.  Activity Barriers & Risk Stratification: Activity Barriers & Cardiac Risk Stratification - 01/08/20 0936      Activity Barriers & Cardiac Risk Stratification   Activity Barriers  Deconditioning;Left Hip Replacement;Right Hip Replacement;Shortness of Breath;Muscular Weakness       6 Minute Walk: 6 Minute Walk    Row Name 01/08/20 1021         6 Minute Walk   Phase  Initial     Distance  1380 feet     Walk Time  6 minutes     # of Rest Breaks  0     MPH  2.61     METS  2.54     RPE  11      Perceived Dyspnea   1     VO2 Peak  8.9     Symptoms  No     Resting HR  56 bpm     Resting BP  124/80     Resting Oxygen Saturation   93 %     Exercise Oxygen Saturation  during 6 min walk  89 %     Max Ex.  HR  96 bpm     Max Ex. BP  134/68     2 Minute Post BP  114/72       Interval HR   1 Minute HR  89     2 Minute HR  95     3 Minute HR  94     4 Minute HR  96     5 Minute HR  94     6 Minute HR  93     2 Minute Post HR  63     Interval Heart Rate?  Yes       Interval Oxygen   Interval Oxygen?  Yes     Baseline Oxygen Saturation %  93 %     1 Minute Oxygen Saturation %  94 %     1 Minute Liters of Oxygen  0 L     2 Minute Oxygen Saturation %  94 %     2 Minute Liters of Oxygen  0 L     3 Minute Oxygen Saturation %  94 %     3 Minute Liters of Oxygen  0 L     4 Minute Oxygen Saturation %  89 %     4 Minute Liters of Oxygen  0 L     5 Minute Oxygen Saturation %  89 %     5 Minute Liters of Oxygen  0 L     6 Minute Oxygen Saturation %  94 %     6 Minute Liters of Oxygen  0 L     2 Minute Post Oxygen Saturation %  96 %     2 Minute Post Liters of Oxygen  0 L        Oxygen Initial Assessment: Oxygen Initial Assessment - 01/08/20 1021      Home Oxygen   Home Oxygen Device  None    Sleep Oxygen Prescription  None    Home Exercise Oxygen Prescription  None    Home at Rest Exercise Oxygen Prescription  None    Compliance with Home Oxygen Use  No      Initial 6 min Walk   Oxygen Used  None      Program Oxygen Prescription   Program Oxygen Prescription  None      Intervention   Short Term Goals  To learn and exhibit compliance with exercise, home and travel O2 prescription;To learn and understand importance of monitoring SPO2 with pulse oximeter and demonstrate accurate use of the pulse oximeter.;To learn and understand importance of maintaining oxygen saturations>88%;To learn and demonstrate proper pursed lip breathing techniques or other breathing techniques.;To  learn and demonstrate proper use of respiratory medications    Long  Term Goals  Exhibits compliance with exercise, home and travel O2 prescription;Verbalizes importance of monitoring SPO2 with pulse oximeter and return demonstration;Maintenance of O2 saturations>88%;Exhibits proper breathing techniques, such as pursed lip breathing or other method taught during program session;Compliance with respiratory medication;Demonstrates proper use of MDI's       Oxygen Re-Evaluation:   Oxygen Discharge (Final Oxygen Re-Evaluation):   Initial Exercise Prescription: Initial Exercise Prescription - 01/08/20 1000      Date of Initial Exercise RX and Referring Provider   Date  01/08/20    Referring Provider  Dr. Lamonte Sakai      Treadmill   MPH  2.1    Grade  1    Minutes  15      NuStep  Level  3    SPM  80    Minutes  15      Prescription Details   Frequency (times per week)  2    Duration  Progress to 30 minutes of continuous aerobic without signs/symptoms of physical distress      Intensity   THRR 40-80% of Max Heartrate  60-120    Ratings of Perceived Exertion  11-13    Perceived Dyspnea  0-4      Progression   Progression  Continue to progress workloads to maintain intensity without signs/symptoms of physical distress.      Resistance Training   Training Prescription  Yes    Weight  orange bands    Reps  10-15       Perform Capillary Blood Glucose checks as needed.  Exercise Prescription Changes:   Exercise Comments:   Exercise Goals and Review: Exercise Goals    Row Name 01/08/20 1027             Exercise Goals   Increase Physical Activity  Yes       Intervention  Provide advice, education, support and counseling about physical activity/exercise needs.;Develop an individualized exercise prescription for aerobic and resistive training based on initial evaluation findings, risk stratification, comorbidities and participant's personal goals.       Expected  Outcomes  Short Term: Attend rehab on a regular basis to increase amount of physical activity.;Long Term: Add in home exercise to make exercise part of routine and to increase amount of physical activity.;Long Term: Exercising regularly at least 3-5 days a week.       Increase Strength and Stamina  Yes       Intervention  Provide advice, education, support and counseling about physical activity/exercise needs.;Develop an individualized exercise prescription for aerobic and resistive training based on initial evaluation findings, risk stratification, comorbidities and participant's personal goals.       Expected Outcomes  Short Term: Increase workloads from initial exercise prescription for resistance, speed, and METs.;Short Term: Perform resistance training exercises routinely during rehab and add in resistance training at home;Long Term: Improve cardiorespiratory fitness, muscular endurance and strength as measured by increased METs and functional capacity (6MWT)       Able to understand and use rate of perceived exertion (RPE) scale  Yes       Intervention  Provide education and explanation on how to use RPE scale       Expected Outcomes  Short Term: Able to use RPE daily in rehab to express subjective intensity level;Long Term:  Able to use RPE to guide intensity level when exercising independently       Able to understand and use Dyspnea scale  Yes       Intervention  Provide education and explanation on how to use Dyspnea scale       Expected Outcomes  Short Term: Able to use Dyspnea scale daily in rehab to express subjective sense of shortness of breath during exertion;Long Term: Able to use Dyspnea scale to guide intensity level when exercising independently       Knowledge and understanding of Target Heart Rate Range (THRR)  Yes       Intervention  Provide education and explanation of THRR including how the numbers were predicted and where they are located for reference       Expected Outcomes   Short Term: Able to state/look up THRR;Short Term: Able to use daily as guideline for intensity in rehab;Long Term: Able  to use THRR to govern intensity when exercising independently       Understanding of Exercise Prescription  Yes       Intervention  Provide education, explanation, and written materials on patient's individual exercise prescription       Expected Outcomes  Short Term: Able to explain program exercise prescription;Long Term: Able to explain home exercise prescription to exercise independently          Exercise Goals Re-Evaluation :   Discharge Exercise Prescription (Final Exercise Prescription Changes):   Nutrition:  Target Goals: Understanding of nutrition guidelines, daily intake of sodium 1500mg , cholesterol 200mg , calories 30% from fat and 7% or less from saturated fats, daily to have 5 or more servings of fruits and vegetables.  Biometrics:    Nutrition Therapy Plan and Nutrition Goals:   Nutrition Assessments:   Nutrition Goals Re-Evaluation:   Nutrition Goals Discharge (Final Nutrition Goals Re-Evaluation):   Psychosocial: Target Goals: Acknowledge presence or absence of significant depression and/or stress, maximize coping skills, provide positive support system. Participant is able to verbalize types and ability to use techniques and skills needed for reducing stress and depression.  Initial Review & Psychosocial Screening:   Quality of Life Scores:  Scores of 19 and below usually indicate a poorer quality of life in these areas.  A difference of  2-3 points is a clinically meaningful difference.  A difference of 2-3 points in the total score of the Quality of Life Index has been associated with significant improvement in overall quality of life, self-image, physical symptoms, and general health in studies assessing change in quality of life.  PHQ-9: Recent Review Flowsheet Data    Depression screen [facility] 2/9 01/08/2020   Decreased Interest 0    Down, Depressed, Hopeless 0   PHQ - 2 Score 0   Altered sleeping 3    Tired, decreased energy 0   Change in appetite 0   Feeling bad or failure about yourself  0   Trouble concentrating 0   Moving slowly or fidgety/restless 0   Suicidal thoughts 0   Difficult doing work/chores Not difficult at all     Interpretation of Total Score  Total Score Depression Severity:  1-4 = Minimal depression, 5-9 = Mild depression, 10-14 = Moderate depression, 15-19 = Moderately severe depression, 20-27 = Severe depression   Psychosocial Evaluation and Intervention: Psychosocial Evaluation - 01/08/20 0952      Psychosocial Evaluation & Interventions   Interventions  Encouraged to exercise with the program and follow exercise prescription    Comments  No barriers or psychosocial concerns identified    Expected Outcomes  For patient to have no concerns while participating in pulmonary rehab    Continue Psychosocial Services   No Follow up required       Psychosocial Re-Evaluation:   Psychosocial Discharge (Final Psychosocial Re-Evaluation):   Education: Education Goals: Education classes will be provided on a weekly basis, covering required topics. Participant will state understanding/return demonstration of topics presented.  Learning Barriers/Preferences: Learning Barriers/Preferences - 01/08/20 [id number]      Learning Barriers/Preferences   Learning Barriers  None    Learning Preferences  Written Material;Computer/Internet;Verbal Instruction;Skilled Demonstration;Individual Instruction;Group Instruction       Education Topics: Risk Factor Reduction:  -Group instruction that is supported by a PowerPoint presentation. Instructor discusses the definition of a risk factor, different risk factors for pulmonary disease, and how the heart and lungs work together.     Nutrition for Pulmonary Patient:  -Group  instruction provided by PowerPoint slides, verbal discussion, and written materials to  support subject matter. The instructor gives an explanation and review of healthy diet recommendations, which includes a discussion on weight management, recommendations for fruit and vegetable consumption, as well as protein, fluid, caffeine, fiber, sodium, sugar, and alcohol. Tips for eating when patients are short of breath are discussed.   Pursed Lip Breathing:  -Group instruction that is supported by demonstration and informational handouts. Instructor discusses the benefits of pursed lip and diaphragmatic breathing and detailed demonstration on how to preform both.     Oxygen Safety:  -Group instruction provided by PowerPoint, verbal discussion, and written material to support subject matter. There is an overview of "What is Oxygen" and "Why do we need it".  Instructor also reviews how to create a safe environment for oxygen use, the importance of using oxygen as prescribed, and the risks of noncompliance. There is a brief discussion on traveling with oxygen and resources the patient may utilize.   Oxygen Equipment:  -Group instruction provided by [facility] Staff utilizing handouts, written materials, and equipment demonstrations.   Signs and Symptoms:  -Group instruction provided by written material and verbal discussion to support subject matter. Warning signs and symptoms of infection, stroke, and heart attack are reviewed and when to call the physician/911 reinforced. Tips for preventing the spread of infection discussed.   Advanced Directives:  -Group instruction provided by verbal instruction and written material to support subject matter. Instructor reviews Advanced Directive laws and proper instruction for filling out document.   Pulmonary Video:  -Group video education that reviews the importance of medication and oxygen compliance, exercise, good nutrition, pulmonary hygiene, and pursed lip and diaphragmatic breathing for the pulmonary patient.   Exercise for the  Pulmonary Patient:  -Group instruction that is supported by a PowerPoint presentation. Instructor discusses benefits of exercise, core components of exercise, frequency, duration, and intensity of an exercise routine, importance of utilizing pulse oximetry during exercise, safety while exercising, and options of places to exercise outside of rehab.     Pulmonary Medications:  -Verbally interactive group education provided by instructor with focus on inhaled medications and proper administration.   Anatomy and Physiology of the Respiratory System and Intimacy:  -Group instruction provided by PowerPoint, verbal discussion, and written material to support subject matter. Instructor reviews respiratory cycle and anatomical components of the respiratory system and their functions. Instructor also reviews differences in obstructive and restrictive respiratory diseases with examples of each. Intimacy, Sex, and Sexuality differences are reviewed with a discussion on how relationships can change when diagnosed with pulmonary disease. Common sexual concerns are reviewed.   MD DAY -A group question and answer session with a medical doctor that allows participants to ask questions that relate to their pulmonary disease state.   OTHER EDUCATION -Group or individual verbal, written, or video instructions that support the educational goals of the pulmonary rehab program.   Holiday Eating Survival Tips:  -Group instruction provided by PowerPoint slides, verbal discussion, and written materials to support subject matter. The instructor gives patients tips, tricks, and techniques to help them not only survive but enjoy the holidays despite the onslaught of food that accompanies the holidays.   Knowledge Questionnaire Score: Knowledge Questionnaire Score - 01/08/20 1001      Knowledge Questionnaire Score   Pre Score  16/18       Core Components/Risk Factors/Patient Goals at Admission: Personal Goals  and Risk Factors at Admission - 01/08/20 [id number]  Core Components/Risk Factors/Patient Goals on Admission   Improve shortness of breath with ADL's  Yes    Intervention  Provide education, individualized exercise plan and daily activity instruction to help decrease symptoms of SOB with activities of daily living.    Expected Outcomes  Short Term: Improve cardiorespiratory fitness to achieve a reduction of symptoms when performing ADLs;Long Term: Be able to perform more ADLs without symptoms or delay the onset of symptoms       Core Components/Risk Factors/Patient Goals Review:  Goals and Risk Factor Review    Row Name 01/08/20 0954             Core Components/Risk Factors/Patient Goals Review   Personal Goals Review  Increase knowledge of respiratory medications and ability to use respiratory devices properly.;Improve shortness of breath with ADL's;Develop more efficient breathing techniques such as purse lipped breathing and diaphragmatic breathing and practicing self-pacing with activity.          Core Components/Risk Factors/Patient Goals at Discharge (Final Review):  Goals and Risk Factor Review - 01/08/20 0954      Core Components/Risk Factors/Patient Goals Review   Personal Goals Review  Increase knowledge of respiratory medications and ability to use respiratory devices properly.;Improve shortness of breath with ADL's;Develop more efficient breathing techniques such as purse lipped breathing and diaphragmatic breathing and practicing self-pacing with activity.       ITP Comments:   Comments:

## 2020-01-08 NOTE — Progress Notes (Signed)
Annette [name] 69 y.o. female Pulmonary Rehab Orientation Note Patient arrived today in Cardiac and Pulmonary Rehab for orientation to Pulmonary Rehab. She walked from the [facility] parking deck with moderate shortness of breath.  She does not carry portable oxygen. Per pt, she uses oxygen never. Color good, skin warm and dry. Patient is oriented to time and place. Patient's medical history, psychosocial health, and medications reviewed. Psychosocial assessment reveals pt lives alone. Pt is currently retired as an Dance movement psychotherapist.  Pt does not exhibit signs of depression. PHQ2/9 score 0/3.  She does have difficulty falling asleep and staying asleep, but it is not related to depression.  Pt shows good  coping skills with positive outlook . Will continue to monitor and evaluate progress toward psychosocial goal(s) of mental wellbeing while participating in pulmonary rehab. Physical assessment reveals heart rate is normal, breath sounds clear to auscultation, no wheezes, rales, or rhonchi. Grip strength equal, strong. Patient reports she does take medications as prescribed. Patient states she follows a Regular diet. She has gained 50 pounds since 2016, and has lost 15 pounds recently by eating healthier. Patient's weight will be monitored closely. Demonstration and practice of PLB using pulse oximeter. Patient able to return demonstration satisfactorily. Safety and hand hygiene in the exercise area reviewed with patient. Patient voices understanding of the information reviewed. Department expectations discussed with patient and achievable goals were set. The patient shows enthusiasm about attending the program and we look forward to working with this nice lady. The patient completed a 6 min walk test on today, 01/08/2020 and to begin exercise on Tuesday, 01/16/2020 in the 1030 exercise slot.  0900-1100

## 2020-01-11 ENCOUNTER — Telehealth: Payer: Self-pay | Admitting: *Deleted

## 2020-01-11 ENCOUNTER — Encounter: Payer: Self-pay | Admitting: Cardiology

## 2020-01-11 ENCOUNTER — Telehealth (INDEPENDENT_AMBULATORY_CARE_PROVIDER_SITE_OTHER): Payer: PPO | Admitting: Cardiology

## 2020-01-11 VITALS — BP 118/68 | HR 62 | Ht 67.0 in | Wt 222.0 lb

## 2020-01-11 DIAGNOSIS — R002 Palpitations: Secondary | ICD-10-CM

## 2020-01-11 DIAGNOSIS — R252 Cramp and spasm: Secondary | ICD-10-CM | POA: Insufficient documentation

## 2020-01-11 DIAGNOSIS — R0600 Dyspnea, unspecified: Secondary | ICD-10-CM | POA: Diagnosis not present

## 2020-01-11 DIAGNOSIS — E7849 Other hyperlipidemia: Secondary | ICD-10-CM | POA: Diagnosis not present

## 2020-01-11 DIAGNOSIS — I2510 Atherosclerotic heart disease of native coronary artery without angina pectoris: Secondary | ICD-10-CM | POA: Diagnosis not present

## 2020-01-11 DIAGNOSIS — R0609 Other forms of dyspnea: Secondary | ICD-10-CM

## 2020-01-11 NOTE — Progress Notes (Signed)
Virtual Visit via Video Note   This visit type was conducted due to national recommendations for restrictions regarding the COVID-19 Pandemic (e.g. social distancing) in an effort to limit this patient's exposure and mitigate transmission in our community.  Due to her co-morbid illnesses, this patient is at least at moderate risk for complications without adequate follow up.  This format is felt to be most appropriate for this patient at this time.  All issues noted in this document were discussed and addressed.  A limited physical exam was performed with this format.  Please refer to the patient's chart for her consent to telehealth for [facility].   Patient has given verbal permission to conduct this visit via virtual appointment and to bill insurance 01/11/2020 10:14 AM     Evaluation Performed:  Follow-up visit  Date:  01/14/2020   ID:  Annette, [name] Mar 27, 1951, MRN [id number]  Patient Location: Home Provider Location: Other:  Hospital Office  PCP:  Kelton Pillar, MD  Cardiologist:  Glenetta Hew, MD  Electrophysiologist:  None   Chief Complaint:   Chief Complaint  Patient presents with  . Follow-up    Did not tolerate amlodipine, stopped because of dizziness.  . Shortness of Breath    On exertion    History of Present Illness:    Annette [name] is a 69 y.o. female with PMH notable for coronary calcification on CT scan (calcium score 79 with mild CAD on coronary CTA) and significant exertional dyspnea who presents via audio/video conferencing for a telehealth visit today.  Annette [name] was last seen via telemedicine in December 2020 to follow-up Myoview stress test read as low risk with fixed apical/anteroseptal defect likely related to breast attenuation. --> She still noted significant exertional dyspnea with some rapid heart rate spells.  Exercise intolerance noted.  No chest pain.  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  Hospitalizations:  . None   Recent - Interim CV studies:   The following studies were reviewed today: . No new studies.  Inerval History   Annette [name] is being seen in follow-up indicating that she has had her medications adjusted by Dr. Lamonte Sakai recently and is starting on pulmonary rehab.  She still has pretty significant exertional dyspnea, but no real anginal symptoms of chest pain.  She told me that she stopped taking amlodipine because it made her feel dizzy.  She has gradually increase her exercise level where she was able to go may be a mile around the track last week having to stop just wants to catch her breath.  She is now on Serevent and Zyrtec.  She really notes having tightness in her chest with cough more than in the office.  No other real chest pain.  About 2 weeks ago she had some palpitations while reading.  There are preshort-lived.  Lasted a few seconds to less than a minute.  Nothing prolonged.  Pretty well controlled with propranolol.  Notes having some hand and foot cramping off and on.  Cardiovascular ROS: positive for - dyspnea on exertion, palpitations, shortness of breath and Significant exercise intolerance negative for - edema, orthopnea, paroxysmal nocturnal dyspnea, rapid heart rate or Syncope/near syncope, TIA/amaurosis fugax complication   ROS:  Please see the history of present illness.    The patient does not have symptoms concerning for COVID-19 infection (fever, chills,  cough, or new shortness of breath).  Review of Systems  Constitutional: Positive for malaise/fatigue (More exercise intolerance than anything else). Negative for weight loss.  HENT: Negative for congestion and nosebleeds.   Respiratory: Positive for cough, shortness of breath and wheezing. Negative for hemoptysis and sputum production.    Gastrointestinal: Negative for abdominal pain, blood in stool and melena.  Genitourinary: Negative for hematuria.  Musculoskeletal: Positive for joint pain (Hand and foot cramping).  Neurological: Negative for dizziness (Not having any more since being off amlodipine.), focal weakness and weakness.  Psychiatric/Behavioral: Negative for memory loss. The patient is not nervous/anxious and does not have insomnia.     The patient is practicing social distancing.  COVID-19 vaccine injections January 20 and October 07, 2019  2nd Covid vaccine injection - Feb.   Past Medical History:  Diagnosis Date  . Arthritis    Right hip, end stage  . Coronary artery calcification seen on computed tomography 02/2018   Coronary calcium score 79.  Coronary CT angiogram: Mild CAD and proximal LAD, proximal RCA and mid LCx.  Moderate plaque ostial LCx.  Mildly dilated pulmonary artery, suggestive of possible pulmonary hypertension  . Difficulty sleeping    DUE TO PAIN  . Emphysema of lung ([place])   . Fluid retention    TAKES HCTZ  . Hemorrhoids   . Hyperlipidemia   . Hypertension   . Hypothyroidism   . Irritable bowel syndrome   . Shingles 2004  . Tremors of nervous system    TAKES PROPRANOLOL TO TX  . Varicose veins    Past Surgical History:  Procedure Laterality Date  . ABDOMINAL HYSTERECTOMY  2000  . CATARACT EXTRACTION Right   . CHOLECYSTECTOMY  1994  . COLONOSCOPY    . ERCP  2004   sludge in CBD (jaundiced)  . ESOPHAGOGASTRODUODENOSCOPY    . INCISE AND DRAIN ABCESS Left 05/2016   wrist  . INCISION / DRAINAGE HAND / FINGER     LEFT INDEX FINGER  . JOINT REPLACEMENT    . Lexiscan Myoview  06/2019   Lexiscan Myoview 07/05/2019: LOW RISK.  EF 60 to 65%.  Fixed anteroseptal apical defect consistent with breast attenuation.  . OVARIAN CYST REMOVAL  [id number]  . ROTATOR CUFF REPAIR     left  . TOTAL HIP ARTHROPLASTY  06/2010   right  . TOTAL HIP ARTHROPLASTY Left 03/22/2015   Procedure:  LEFT TOTAL HIP ARTHROPLASTY ANTERIOR APPROACH;  Surgeon: Gaynelle Arabian, MD;  Location: [facility];  Service: Orthopedics;  Laterality: Left;  . TRANSTHORACIC ECHOCARDIOGRAM  05/31/2019   EF 60-65%. Gr 1 DD (normal for Age). Normal valves & chamber sizes.     Current Meds  Medication Sig  . albuterol (VENTOLIN HFA) 108 (90 Base) MCG/ACT inhaler Inhale 2 puffs into the lungs every 6 (six) hours as needed for wheezing or shortness of breath.  . ALPRAZolam (XANAX) 0.25 MG tablet TK 1 T PO QD PRN  . aspirin EC 81 MG tablet Take 81 mg by mouth daily.  . Cholecalciferol (KP VITAMIN D3) 50 MCG (2000 UT) CAPS Take by mouth.  . hydrochlorothiazide (HYDRODIURIL) 25 MG tablet Take 1 tablet (25 mg total) by mouth daily.  Marland Kitchen ketoconazole (NIZORAL) 2 % cream as needed.   Marland Kitchen levothyroxine (SYNTHROID, LEVOTHROID) 100 MCG tablet take 1 tablet by mouth every morning ON AN EMPTY STOMACH  . Multiple Vitamin (MULTIVITAMIN) tablet Take 1 tablet by mouth daily.  Marland Kitchen omeprazole (PRILOSEC) 20 MG capsule Take 20 mg  by mouth every morning.  . propranolol ER (INDERAL LA) 60 MG 24 hr capsule Take 60 mg by mouth daily.  . rosuvastatin (CRESTOR) 5 MG tablet Take 5 mg by mouth daily.  . salmeterol (SEREVENT DISKUS) 50 MCG/DOSE diskus inhaler Inhale 1 puff into the lungs in the morning and at bedtime.     Allergies:   Augmentin [amoxicillin-pot clavulanate], Codeine, Other, and Iodinated diagnostic agents   Social History   Tobacco Use  . Smoking status: Former Smoker    Packs/day: 1.00    Years: 38.00    Pack years: 38.00    Quit date: 03/14/2009    Years since quitting: 10.8  . Smokeless tobacco: Never Used  Substance Use Topics  . Alcohol use: Yes    Comment: beer every 6-8 months  . Drug use: No     Family Hx: The patient's family history includes Alzheimer's disease in her father; Breast cancer in her sister; CAD (age of onset: 55) in her mother; Stroke in her mother.   Labs/Other Tests and Data Reviewed:     EKG:  No ECG reviewed.  Recent Labs: 05/31/2019: BNP 17.4; BUN 19; Creatinine, Ser 0.81; Potassium 4.6; Sodium 140   Recent Lipid Panel No results found for: CHOL, TRIG, HDL, CHOLHDL, LDLCALC, LDLDIRECT  Wt Readings from Last 3 Encounters:  01/11/20 222 lb (100.7 kg)  01/08/20 228 lb 6.3 oz (103.6 kg)  12/05/19 227 lb (103 kg)   - down total 15 lb from last fall  Objective:    Vital Signs:  BP 118/68   Pulse 62   Ht 5\' 7"  (1.702 m)   Wt 222 lb (100.7 kg)   BMI 34.77 kg/m   VITAL SIGNS:  reviewed Pleasant female in none acute distress. A&O x 3.  Normal Mood & Affect Non-labored respirations   ASSESSMENT & PLAN:    Problem List Items Addressed This Visit    DOE (dyspnea on exertion) - Primary (Chronic)    Probably mostly pulmonary in nature.  She is doing pulmonary rehab and doing relatively well.  She did not tolerate amlodipine which was started for possible microvascular disease-diastolic function.  We could consider Ranexa which would not affect her blood pressure versus having her back off on the diuretic to allow blood pressure for amlodipine.   For now, we will hold off and reevaluate when she finishes the few months of pulmonary rehab.      Coronary artery calcification seen on computed tomography (Chronic)    Coronary CTA and now [place] would argue against macrovascular disease.  Cannot exclude microvascular disease. She did not tolerate mild calcium channel blocker.  Could consider Ranexa as a last ditch effort.  Recommend lipid control and blood pressure control.      Hyperlipidemia due to dietary fat intake (Chronic)    Most recent LDL was 94 back in December.  This is on current dose of Crestor.  She is having some hand cramping.  It does not sound like it is related to statin, but we can try to eat statin holiday and reassess.  If there is improvement, I want him to restart rosuvastatin 3 days a week along with co-Q10 300 mg daily..  If no  change in symptoms, return to her regular dose plus or minus co-Q10.      Palpitations (Chronic)    Much better controlled with propranolol.      Hand cramps    Trial of 2 weeks statin holiday.  Her  symptoms improved, return to 3 days a week rosuvastatin plus CoQ10 300 mg daily.  If no change in symptoms, simply restart rosuvastatin at standing dose daily.         COVID-19 Education: The signs and symptoms of COVID-19 were discussed with the patient and how to seek care for testing (follow up with PCP or arrange E-visit).   The importance of social distancing was discussed today.  Time:   Today, I have spent 22 minutes with the patient with telehealth technology discussing the above problems.  6 minutes charting.  Total time 28-minute   Medication Adjustments/Labs and Tests Ordered: Current medicines are reviewed at length with the patient today.  Concerns regarding medicines are outlined above.   Patient Instructions  Medication Instructions:  For the hand cramps, try doing a statin for 2 weeks and then reassess.  If no change, then is probably not related to the statin and just restart. If you do notice improvement in symptoms, try to restart rosuvastatin 3 days a week and take along with CoQ.10 total of 300 mg a day  *If you need a refill on your cardiac medications before your next appointment, please call your pharmacy*   Lab Work: None     Testing/Procedures: None   Follow-Up: At [facility], you and your health needs are our priority.  As part of our continuing mission to provide you with exceptional heart care, we have created designated Provider Care Teams.  These Care Teams include your primary Cardiologist (physician) and Advanced Practice Providers (APPs -  Physician Assistants and Nurse Practitioners) who all work together to provide you with the care you need, when you need it.    Your next appointment:   6 month(s)  The format for your next  appointment:   In Person  Provider:   Dr. Ellyn Hack   Other Instructions Continue pulmonary rehab.     Signed, Glenetta Hew, MD  01/14/2020 7:03 PM    [place]

## 2020-01-11 NOTE — Patient Instructions (Addendum)
Medication Instructions:  For the hand cramps, try doing a statin for 2 weeks and then reassess.  If no change, then is probably not related to the statin and just restart. If you do notice improvement in symptoms, try to restart rosuvastatin 3 days a week and take along with CoQ.10 total of 300 mg a day  *If you need a refill on your cardiac medications before your next appointment, please call your pharmacy*   Lab Work: None     Testing/Procedures: None   Follow-Up: At [facility], you and your health needs are our priority.  As part of our continuing mission to provide you with exceptional heart care, we have created designated Provider Care Teams.  These Care Teams include your primary Cardiologist (physician) and Advanced Practice Providers (APPs -  Physician Assistants and Nurse Practitioners) who all work together to provide you with the care you need, when you need it.    Your next appointment:   6 month(s)  The format for your next appointment:   In Person  Provider:   Dr. Ellyn Hack   Other Instructions Continue pulmonary rehab.

## 2020-01-11 NOTE — Telephone Encounter (Signed)
RN spoke to patient. Instruction were given  from today's virtual visit 01/11/20 .  AVS SUMMARY has been sent by mychart . Patient schedule for 6 month - Nov 19 , 2021 - at 10 am  Patient verbalized understanding

## 2020-01-11 NOTE — Telephone Encounter (Signed)
  Patient Consent for Virtual Visit         Annette [name] has provided verbal consent on 01/11/2020 for a virtual visit (video or telephone).   CONSENT FOR VIRTUAL VISIT FOR:  Annette [name]  By participating in this virtual visit I agree to the following:  I hereby voluntarily request, consent and authorize [facility] and its employed or contracted physicians, physician assistants, nurse practitioners or other licensed health care professionals (the Practitioner), to provide me with telemedicine health care services (the "Services") as deemed necessary by the treating Practitioner. I acknowledge and consent to receive the Services by the Practitioner via telemedicine. I understand that the telemedicine visit will involve communicating with the Practitioner through live audiovisual communication technology and the disclosure of certain medical information by electronic transmission. I acknowledge that I have been given the opportunity to request an in-person assessment or other available alternative prior to the telemedicine visit and am voluntarily participating in the telemedicine visit.  I understand that I have the right to withhold or withdraw my consent to the use of telemedicine in the course of my care at any time, without affecting my right to future care or treatment, and that the Practitioner or I may terminate the telemedicine visit at any time. I understand that I have the right to inspect all information obtained and/or recorded in the course of the telemedicine visit and may receive copies of available information for a reasonable fee.  I understand that some of the potential risks of receiving the Services via telemedicine include:  Marland Kitchen Delay or interruption in medical evaluation due to technological equipment failure or disruption; . Information transmitted may not be sufficient (e.g. poor resolution of images) to allow for appropriate medical decision making by the Practitioner;  and/or  . In rare instances, security protocols could fail, causing a breach of personal health information.  Furthermore, I acknowledge that it is my responsibility to provide information about my medical history, conditions and care that is complete and accurate to the best of my ability. I acknowledge that Practitioner's advice, recommendations, and/or decision may be based on factors not within their control, such as incomplete or inaccurate data provided by me or distortions of diagnostic images or specimens that may result from electronic transmissions. I understand that the practice of medicine is not an exact science and that Practitioner makes no warranties or guarantees regarding treatment outcomes. I acknowledge that a copy of this consent can be made available to me via my patient portal ([place] [facility]), or I can request a printed copy by calling the office of [facility].    I understand that my insurance will be billed for this visit.   I have read or had this consent read to me. . I understand the contents of this consent, which adequately explains the benefits and risks of the Services being provided via telemedicine.  . I have been provided ample opportunity to ask questions regarding this consent and the Services and have had my questions answered to my satisfaction. . I give my informed consent for the services to be provided through the use of telemedicine in my medical care

## 2020-01-14 ENCOUNTER — Encounter: Payer: Self-pay | Admitting: Cardiology

## 2020-01-14 NOTE — Assessment & Plan Note (Signed)
Most recent LDL was 94 back in December.  This is on current dose of Crestor.  She is having some hand cramping.  It does not sound like it is related to statin, but we can try to eat statin holiday and reassess.  If there is improvement, I want him to restart rosuvastatin 3 days a week along with co-Q10 300 mg daily..  If no change in symptoms, return to her regular dose plus or minus co-Q10.

## 2020-01-14 NOTE — Assessment & Plan Note (Signed)
Trial of 2 weeks statin holiday.  Her symptoms improved, return to 3 days a week rosuvastatin plus CoQ10 300 mg daily.  If no change in symptoms, simply restart rosuvastatin at standing dose daily.

## 2020-01-14 NOTE — Assessment & Plan Note (Signed)
Coronary CTA and now Annette [name] would argue against macrovascular disease.  Cannot exclude microvascular disease. She did not tolerate mild calcium channel blocker.  Could consider Ranexa as a last ditch effort.  Recommend lipid control and blood pressure control.

## 2020-01-14 NOTE — Assessment & Plan Note (Signed)
Much better controlled with propranolol.

## 2020-01-14 NOTE — Assessment & Plan Note (Addendum)
Probably mostly pulmonary in nature.  She is doing pulmonary rehab and doing relatively well.  She did not tolerate amlodipine which was started for possible microvascular disease-diastolic function.  We could consider Ranexa which would not affect her blood pressure versus having her back off on the diuretic to allow blood pressure for amlodipine.   For now, we will hold off and reevaluate when she finishes the few months of pulmonary rehab.

## 2020-01-16 ENCOUNTER — Other Ambulatory Visit: Payer: Self-pay

## 2020-01-16 ENCOUNTER — Encounter (HOSPITAL_COMMUNITY)
Admission: RE | Admit: 2020-01-16 | Discharge: 2020-01-16 | Disposition: A | Discharge status: Home / Self Care | Payer: PPO | Source: Ambulatory Visit | Attending: Emergency Medicine | Admitting: Emergency Medicine

## 2020-01-16 VITALS — Wt 229.3 lb

## 2020-01-16 DIAGNOSIS — J449 Chronic obstructive pulmonary disease, unspecified: Secondary | ICD-10-CM | POA: Diagnosis not present

## 2020-01-16 NOTE — Progress Notes (Signed)
Daily Session Note  Patient Details  Name: Annette [name] MRN: [id number] Date of Birth: 1950-09-22 Referring Provider:     Pulmonary Rehab Walk Test from 01/08/2020 in [place]  Referring Provider  Dr. Lamonte Sakai      Encounter Date: 01/16/2020  Check In: Session Check In - 01/16/20 1130      Check-In   Supervising physician immediately available to respond to emergencies  [facility] immediately available    Physician(s)  Dr. Broadus John    Location  [facility]    Staff Present  Hoy Register, MS, CEP, Exercise Physiologist;Joan Leonia Reeves, RN, BSN;Ramon Dredge, RN, MHA    Virtual Visit  No    Medication changes reported      No    Fall or balance concerns reported     No    Tobacco Cessation  No Change    Warm-up and Cool-down  Performed on first and last piece of equipment    Resistance Training Performed  Yes    VAD Patient?  No    PAD/SET Patient?  No      Pain Assessment   Currently in Pain?  No/denies    Multiple Pain Sites  No       Capillary Blood Glucose: No results found for this or any previous visit (from the past 24 hour(s)).  Exercise Prescription Changes - 01/16/20 1200      Response to Exercise   Blood Pressure (Admit)  112/60    Blood Pressure (Exercise)  132/76    Blood Pressure (Exit)  114/70    Heart Rate (Admit)  76 bpm    Heart Rate (Exercise)  87 bpm    Heart Rate (Exit)  86 bpm    Oxygen Saturation (Admit)  94 %    Oxygen Saturation (Exercise)  92 %    Oxygen Saturation (Exit)  94 %    Rating of Perceived Exertion (Exercise)  11    Perceived Dyspnea (Exercise)  1    Duration  Continue with 30 min of aerobic exercise without signs/symptoms of physical distress.    Intensity  THRR unchanged      Progression   Progression  Continue to progress workloads to maintain intensity without signs/symptoms of physical distress.      Resistance Training   Training Prescription  Yes    Weight   orange bands    Reps  10-15      Treadmill   MPH  2.1    Grade  1    Minutes  15      NuStep   Level  3    SPM  80    Minutes  15    METs  2.2       Social History   Tobacco Use  Smoking Status Former Smoker  . Packs/day: 1.00  . Years: 38.00  . Pack years: 38.00  . Quit date: 03/14/2009  . Years since quitting: 10.8  Smokeless Tobacco Never Used    Goals Met:  Independence with exercise equipment Exercise tolerated well Strength training completed today  Goals Unmet:  Not Applicable  Comments: Service time is from 1030 to 1138    Dr. Fransico Him is Medical Director for Cardiac Rehab at [facility].

## 2020-01-18 ENCOUNTER — Encounter (HOSPITAL_COMMUNITY)
Admission: RE | Admit: 2020-01-18 | Discharge: 2020-01-18 | Disposition: A | Discharge status: Home / Self Care | Payer: PPO | Source: Ambulatory Visit | Attending: Emergency Medicine | Admitting: Emergency Medicine

## 2020-01-18 ENCOUNTER — Other Ambulatory Visit: Payer: Self-pay

## 2020-01-18 DIAGNOSIS — J449 Chronic obstructive pulmonary disease, unspecified: Secondary | ICD-10-CM | POA: Diagnosis not present

## 2020-01-18 NOTE — Progress Notes (Signed)
Daily Session Note  Patient Details  Name: Annette [name] MRN: [id number] Date of Birth: 02/25/1951 Referring Provider:     Pulmonary Rehab Walk Test from 01/08/2020 in [place]  Referring Provider  Dr. Lamonte Sakai      Encounter Date: 01/18/2020  Check In: Session Check In - 01/18/20 1214      Check-In   Supervising physician immediately available to respond to emergencies  [facility] immediately available    Physician(s)  Dr. Cyndia Skeeters    Location  [facility]    Staff Present  Rosebud Poles, RN, Bjorn Loser, MS, CEP, Exercise Physiologist;David [facility] MS, EP-C, CCRP    Virtual Visit  No    Medication changes reported      No    Fall or balance concerns reported     No    Tobacco Cessation  No Change    Warm-up and Cool-down  Performed as group-led instruction    Resistance Training Performed  Yes    VAD Patient?  No    PAD/SET Patient?  No      Pain Assessment   Currently in Pain?  No/denies    Multiple Pain Sites  No       Capillary Blood Glucose: No results found for this or any previous visit (from the past 24 hour(s)).    Social History   Tobacco Use  Smoking Status Former Smoker  . Packs/day: 1.00  . Years: 38.00  . Pack years: 38.00  . Quit date: 03/14/2009  . Years since quitting: 10.8  Smokeless Tobacco Never Used    Goals Met:  Proper associated with RPD/PD & O2 Sat Exercise tolerated well Strength training completed today  Goals Unmet:  Not Applicable  Comments: Service time is from 1020 to 1135    Dr. Fransico Him is Medical Director for Cardiac Rehab at [facility].

## 2020-01-18 NOTE — Progress Notes (Signed)
Annette [name] 69 y.o. female Nutrition Note  Visit Diagnosis: Chronic obstructive pulmonary disease, unspecified COPD type ([place])  Past Medical History:  Diagnosis Date  . Arthritis    Right hip, end stage  . Coronary artery calcification seen on computed tomography 02/2018   Coronary calcium score 79.  Coronary CT angiogram: Mild CAD and proximal LAD, proximal RCA and mid LCx.  Moderate plaque ostial LCx.  Mildly dilated pulmonary artery, suggestive of possible pulmonary hypertension  . Difficulty sleeping    DUE TO PAIN  . Emphysema of lung ([place])   . Fluid retention    TAKES HCTZ  . Hemorrhoids   . Hyperlipidemia   . Hypertension   . Hypothyroidism   . Irritable bowel syndrome   . Shingles 2004  . Tremors of nervous system    TAKES PROPRANOLOL TO TX  . Varicose veins      Medications reviewed.   Current Outpatient Medications:  .  albuterol (VENTOLIN HFA) 108 (90 Base) MCG/ACT inhaler, Inhale 2 puffs into the lungs every 6 (six) hours as needed for wheezing or shortness of breath., Disp: 18 g, Rfl: 5 .  ALPRAZolam (XANAX) 0.25 MG tablet, TK 1 T PO QD PRN, Disp: , Rfl:  .  aspirin EC 81 MG tablet, Take 81 mg by mouth daily., Disp: , Rfl:  .  Cholecalciferol (KP VITAMIN D3) 50 MCG (2000 UT) CAPS, Take by mouth., Disp: , Rfl:  .  hydrochlorothiazide (HYDRODIURIL) 25 MG tablet, Take 1 tablet (25 mg total) by mouth daily., Disp: 30 tablet, Rfl: 11 .  ketoconazole (NIZORAL) 2 % cream, as needed. , Disp: , Rfl:  .  levothyroxine (SYNTHROID, LEVOTHROID) 100 MCG tablet, take 1 tablet by mouth every morning ON AN EMPTY STOMACH, Disp: , Rfl: 0 .  Multiple Vitamin (MULTIVITAMIN) tablet, Take 1 tablet by mouth daily., Disp: , Rfl:  .  omeprazole (PRILOSEC) 20 MG capsule, Take 20 mg by mouth every morning., Disp: , Rfl:  .  propranolol ER (INDERAL LA) 60 MG 24 hr capsule, Take 60 mg by mouth daily., Disp: , Rfl:  .  rosuvastatin (CRESTOR) 5 MG tablet, Take 5 mg by mouth daily., Disp: ,  Rfl:  .  salmeterol (SEREVENT DISKUS) 50 MCG/DOSE diskus inhaler, Inhale 1 puff into the lungs in the morning and at bedtime., Disp: 1 Inhaler, Rfl: 6   Ht Readings from Last 1 Encounters:  01/11/20 5\' 7"  (1.702 m)     Wt Readings from Last 3 Encounters:  01/16/20 229 lb 4.5 oz (104 kg)  01/11/20 222 lb (100.7 kg)  01/08/20 228 lb 6.3 oz (103.6 kg)     There is no height or weight on file to calculate BMI.   Social History   Tobacco Use  Smoking Status Former Smoker  . Packs/day: 1.00  . Years: 38.00  . Pack years: 38.00  . Quit date: 03/14/2009  . Years since quitting: 10.8  Smokeless Tobacco Never Used      Nutrition Note  Spoke with pt. Nutrition Plan and Nutrition Survey goals reviewed with pt.  She has been following a healthy diet for the past 2 months. She states her goal is to lose about 30-50 lbs. Reviewed dieting history and weight history. She gained about 50 lbs in the past several years while also dealing with depression/significant life changes.  She did optivia for short term weight loss to get started. She has already transitioned to a "normal" diet. She is eating salads, veggies, nuts  and losing weight slowly. She tries to avoid breads and sweets but still enjoys these things socially. She feels her current intake is sustainable.  We discussed incorporating favorite foods into her normal diet for sustainability.   Pt expressed understanding of the information reviewed.    Nutrition Diagnosis ? Obese  II = 35-39.9 related to excessive energy intake as evidenced by a 35.91 kg/m2  Nutrition Intervention ? Pt's individual nutrition plan reviewed with pt. ? Benefits of adopting healthy diet reviewed with Rate My Plate survey ? Continue client-centered nutrition education by RD, as part of interdisciplinary care.  Goal(s) ? Pt to identify food quantities necessary to achieve weight loss of 6-24 lb at graduation from pulmonary rehab.  ? Pt to build a  healthy plate including vegetables, fruits, whole grains, and low-fat dairy products in a generally healthy meal plan.   Plan:   Will provide client-centered nutrition education as part of interdisciplinary care  Monitor and evaluate progress toward nutrition goal with team.   Michaele Offer, MS, RDN, LDN

## 2020-01-23 ENCOUNTER — Other Ambulatory Visit: Payer: Self-pay

## 2020-01-23 ENCOUNTER — Encounter (HOSPITAL_COMMUNITY)
Admission: RE | Admit: 2020-01-23 | Discharge: 2020-01-23 | Disposition: A | Discharge status: Home / Self Care | Payer: PPO | Source: Ambulatory Visit | Attending: Emergency Medicine | Admitting: Emergency Medicine

## 2020-01-23 DIAGNOSIS — J449 Chronic obstructive pulmonary disease, unspecified: Secondary | ICD-10-CM | POA: Diagnosis not present

## 2020-01-23 NOTE — Progress Notes (Signed)
I have reviewed a Home Exercise Prescription with Annette [name] . Annette [name] is  currently exercising at home.  The patient was advised to walk 2-3 days a week for 45-60 minutes.  Annette [name] and I discussed how to progress their exercise prescription.  The patient stated that their goals were to be able to walk 3 miles again without being short of breath and to do activities of daily living without being breathless.  The patient stated that they understand the exercise prescription.  We reviewed exercise guidelines, target heart rate during exercise, RPE Scale, weather conditions, NTG use, endpoints for exercise, warmup and cool down.  Patient is encouraged to come to me with any questions. I will continue to follow up with the patient to assist them with progression and safety.

## 2020-01-23 NOTE — Progress Notes (Signed)
Daily Session Note  Patient Details  Name: Annette [name] MRN: [id number] Date of Birth: 02/04/1951 Referring Provider:     Pulmonary Rehab Walk Test from 01/08/2020 in [facility]  Referring Provider  Dr. Byrum      Encounter Date: 01/23/2020  Check In: Session Check In - 01/23/20 1203      Check-In   Supervising physician immediately available to respond to emergencies  [facility] immediately available    Physician(s)  Dr. Gherghe    Location  [facility]    Staff Present  Joan Behrens, RN, BSN; , RN;Dalton Fletcher, MS, CEP, Exercise Physiologist    Virtual Visit  No    Medication changes reported      No    Fall or balance concerns reported     No    Tobacco Cessation  No Change    Warm-up and Cool-down  Performed as group-led instruction    Resistance Training Performed  Yes    VAD Patient?  No    PAD/SET Patient?  No      Pain Assessment   Currently in Pain?  No/denies    Multiple Pain Sites  No       Capillary Blood Glucose: No results found for this or any previous visit (from the past 24 hour(s)).    Social History   Tobacco Use  Smoking Status Former Smoker  . Packs/day: 1.00  . Years: 38.00  . Pack years: 38.00  . Quit date: 03/14/2009  . Years since quitting: 10.8  Smokeless Tobacco Never Used    Goals Met:  Exercise tolerated well No report of cardiac concerns or symptoms Strength training completed today  Goals Unmet:  Not Applicable  Comments: Service time is from 1030 to 1200    Dr. Traci Turner is Medical Director for Cardiac Rehab at [facility]. 

## 2020-01-25 ENCOUNTER — Encounter (HOSPITAL_COMMUNITY): Payer: PPO

## 2020-01-29 DIAGNOSIS — Z01419 Encounter for gynecological examination (general) (routine) without abnormal findings: Secondary | ICD-10-CM | POA: Diagnosis not present

## 2020-01-29 DIAGNOSIS — Z6835 Body mass index (BMI) 35.0-35.9, adult: Secondary | ICD-10-CM | POA: Diagnosis not present

## 2020-01-29 DIAGNOSIS — Z1231 Encounter for screening mammogram for malignant neoplasm of breast: Secondary | ICD-10-CM | POA: Diagnosis not present

## 2020-01-30 ENCOUNTER — Encounter (HOSPITAL_COMMUNITY)
Admission: RE | Admit: 2020-01-30 | Discharge: 2020-01-30 | Disposition: A | Discharge status: Home / Self Care | Payer: PPO | Source: Ambulatory Visit | Attending: Emergency Medicine | Admitting: Emergency Medicine

## 2020-01-30 ENCOUNTER — Other Ambulatory Visit: Payer: Self-pay

## 2020-01-30 VITALS — Wt 226.6 lb

## 2020-01-30 DIAGNOSIS — J449 Chronic obstructive pulmonary disease, unspecified: Secondary | ICD-10-CM | POA: Diagnosis not present

## 2020-01-30 NOTE — Progress Notes (Signed)
Daily Session Note  Patient Details  Name: Annette [name] MRN: [id number] Date of Birth: 1951-06-16 Referring Provider:     Pulmonary Rehab Walk Test from 01/08/2020 in [place]  Referring Provider Dr. Lamonte Sakai      Encounter Date: 01/30/2020  Check In:  Session Check In - 01/30/20 [place]      Check-In   Supervising physician immediately available to respond to emergencies [facility] immediately available    Physician(s) Dr. Cruzita Lederer    Location [facility]    Staff Present Rosebud Poles, RN, BSN;Lisa Ysidro Evert, RN;Analissa Bayless Kris Mouton, MS, CEP, Exercise Physiologist    Virtual Visit No    Medication changes reported     No    Fall or balance concerns reported    No    Tobacco Cessation No Change    Warm-up and Cool-down Performed on first and last piece of equipment    Resistance Training Performed Yes    VAD Patient? No    PAD/SET Patient? No      Pain Assessment   Currently in Pain? No/denies    Multiple Pain Sites No           Capillary Blood Glucose: No results found for this or any previous visit (from the past 24 hour(s)).   Exercise Prescription Changes - 01/30/20 1400      Response to Exercise   Blood Pressure (Admit) 100/60    Blood Pressure (Exercise) 106/84    Blood Pressure (Exit) 96/54    Heart Rate (Admit) 67 bpm    Heart Rate (Exercise) 98 bpm    Heart Rate (Exit) 77 bpm    Oxygen Saturation (Admit) 95 %    Oxygen Saturation (Exercise) 91 %    Oxygen Saturation (Exit) 94 %    Rating of Perceived Exertion (Exercise) 13    Perceived Dyspnea (Exercise) 1    Duration Continue with 30 min of aerobic exercise without signs/symptoms of physical distress.    Intensity THRR unchanged      Progression   Progression Continue to progress workloads to maintain intensity without signs/symptoms of physical distress.      Resistance Training   Training Prescription Yes    Weight orange bands    Reps 10-15    Time  10 Minutes      Treadmill   MPH 2.3    Grade 1.5    Minutes 15      NuStep   Level 5    SPM 80    Minutes 15    METs 2.6           Social History   Tobacco Use  Smoking Status Former Smoker  . Packs/day: 1.00  . Years: 38.00  . Pack years: 38.00  . Quit date: 03/14/2009  . Years since quitting: 10.8  Smokeless Tobacco Never Used    Goals Met:  Independence with exercise equipment Exercise tolerated well Strength training completed today  Goals Unmet:  Not Applicable  Comments: Service time is from 1018 to 1125    Dr. Kate Sable is Medical Director for [facility] and Pulmonary Rehab.

## 2020-01-31 NOTE — Progress Notes (Signed)
Pulmonary Individual Treatment Plan  Patient Details  Name: Annette [name] MRN: [id number] Date of Birth: [birth year] Referring Provider:     Pulmonary Rehab Walk Test from 01/08/2020 in [place]  Referring Provider Dr. Lamonte Sakai      Initial Encounter Date:    Pulmonary Rehab Walk Test from 01/08/2020 in [facility]  Date 01/08/20      Visit Diagnosis: Chronic obstructive pulmonary disease, unspecified COPD type ([place])  Patient's Home Medications on Admission:   Current Outpatient Medications:  .  albuterol (VENTOLIN HFA) 108 (90 Base) MCG/ACT inhaler, Inhale 2 puffs into the lungs every 6 (six) hours as needed for wheezing or shortness of breath., Disp: 18 g, Rfl: 5 .  ALPRAZolam (XANAX) 0.25 MG tablet, TK 1 T PO QD PRN, Disp: , Rfl:  .  aspirin EC 81 MG tablet, Take 81 mg by mouth daily., Disp: , Rfl:  .  Cholecalciferol (KP VITAMIN D3) 50 MCG (2000 UT) CAPS, Take by mouth., Disp: , Rfl:  .  hydrochlorothiazide (HYDRODIURIL) 25 MG tablet, Take 1 tablet (25 mg total) by mouth daily., Disp: 30 tablet, Rfl: 11 .  ketoconazole (NIZORAL) 2 % cream, as needed. , Disp: , Rfl:  .  levothyroxine (SYNTHROID, LEVOTHROID) 100 MCG tablet, take 1 tablet by mouth every morning ON AN EMPTY STOMACH, Disp: , Rfl: 0 .  Multiple Vitamin (MULTIVITAMIN) tablet, Take 1 tablet by mouth daily., Disp: , Rfl:  .  omeprazole (PRILOSEC) 20 MG capsule, Take 20 mg by mouth every morning., Disp: , Rfl:  .  propranolol ER (INDERAL LA) 60 MG 24 hr capsule, Take 60 mg by mouth daily., Disp: , Rfl:  .  rosuvastatin (CRESTOR) 5 MG tablet, Take 5 mg by mouth daily., Disp: , Rfl:  .  salmeterol (SEREVENT DISKUS) 50 MCG/DOSE diskus inhaler, Inhale 1 puff into the lungs in the morning and at bedtime., Disp: 1 Inhaler, Rfl: 6  Past Medical History: Past Medical History:  Diagnosis Date  . Arthritis    Right hip, end stage  . Coronary artery calcification seen  on computed tomography 02/2018   Coronary calcium score 79.  Coronary CT angiogram: Mild CAD and proximal LAD, proximal RCA and mid LCx.  Moderate plaque ostial LCx.  Mildly dilated pulmonary artery, suggestive of possible pulmonary hypertension  . Difficulty sleeping    DUE TO PAIN  . Emphysema of lung ([place])   . Fluid retention    TAKES HCTZ  . Hemorrhoids   . Hyperlipidemia   . Hypertension   . Hypothyroidism   . Irritable bowel syndrome   . Shingles 2004  . Tremors of nervous system    TAKES PROPRANOLOL TO TX  . Varicose veins     Tobacco Use: Social History   Tobacco Use  Smoking Status Former Smoker  . Packs/day: 1.00  . Years: 38.00  . Pack years: 38.00  . Quit date: 03/14/2009  . Years since quitting: 10.8  Smokeless Tobacco Never Used    Labs: Recent Review Flowsheet Data   There is no flowsheet data to display.     Capillary Blood Glucose: No results found for: GLUCAP   Pulmonary Assessment Scores:  Pulmonary Assessment Scores    Row Name 01/08/20 0942 01/08/20 1021       ADL UCSD   ADL Phase Entry Entry    SOB Score total 31 --      CAT Score   CAT Score 15 --  mMRC Score   mMRC Score -- 2          UCSD: Self-administered rating of dyspnea associated with activities of daily living (ADLs) 6-point scale (0 = "not at all" to 5 = "maximal or unable to do because of breathlessness")  Scoring Scores range from 0 to 120.  Minimally important difference is 5 units  CAT: CAT can identify the health impairment of COPD patients and is better correlated with disease progression.  CAT has a scoring range of zero to 40. The CAT score is classified into four groups of low (less than 10), medium (10 - 20), high (21-30) and very high (31-40) based on the impact level of disease on health status. A CAT score over 10 suggests significant symptoms.  A worsening CAT score could be explained by an exacerbation, poor medication adherence, poor inhaler  technique, or progression of COPD or comorbid conditions.  CAT MCID is 2 points  mMRC: mMRC (Modified Medical Research Council) Dyspnea Scale is used to assess the degree of baseline functional disability in patients of respiratory disease due to dyspnea. No minimal important difference is established. A decrease in score of 1 point or greater is considered a positive change.   Pulmonary Function Assessment:  Pulmonary Function Assessment - 01/08/20 0938      Initial Spirometry Results   FEV1% 58 %      Breath   Bilateral Breath Sounds Clear    Shortness of Breath Yes;Panic with Shortness of Breath;Limiting activity           Exercise Target Goals: Exercise Program Goal: Individual exercise prescription set using results from initial 6 min walk test and THRR while considering  patient's activity barriers and safety.   Exercise Prescription Goal: Initial exercise prescription builds to 30-45 minutes a day of aerobic activity, 2-3 days per week.  Home exercise guidelines will be given to patient during program as part of exercise prescription that the participant will acknowledge.  Activity Barriers & Risk Stratification:  Activity Barriers & Cardiac Risk Stratification - 01/08/20 0936      Activity Barriers & Cardiac Risk Stratification   Activity Barriers Deconditioning;Left Hip Replacement;Right Hip Replacement;Shortness of Breath;Muscular Weakness           6 Minute Walk:  6 Minute Walk    Row Name 01/08/20 1021         6 Minute Walk   Phase Initial     Distance 1380 feet     Walk Time 6 minutes     # of Rest Breaks 0     MPH 2.61     METS 2.54     RPE 11     Perceived Dyspnea  1     VO2 Peak 8.9     Symptoms No     Resting HR 56 bpm     Resting BP 124/80     Resting Oxygen Saturation  93 %     Exercise Oxygen Saturation  during 6 min walk 89 %     Max Ex. HR 96 bpm     Max Ex. BP 134/68     2 Minute Post BP 114/72       Interval HR   1 Minute HR 89      2 Minute HR 95     3 Minute HR 94     4 Minute HR 96     5 Minute HR 94     6 Minute HR 93  2 Minute Post HR 63     Interval Heart Rate? Yes       Interval Oxygen   Interval Oxygen? Yes     Baseline Oxygen Saturation % 93 %     1 Minute Oxygen Saturation % 94 %     1 Minute Liters of Oxygen 0 L     2 Minute Oxygen Saturation % 94 %     2 Minute Liters of Oxygen 0 L     3 Minute Oxygen Saturation % 94 %     3 Minute Liters of Oxygen 0 L     4 Minute Oxygen Saturation % 89 %     4 Minute Liters of Oxygen 0 L     5 Minute Oxygen Saturation % 89 %     5 Minute Liters of Oxygen 0 L     6 Minute Oxygen Saturation % 94 %     6 Minute Liters of Oxygen 0 L     2 Minute Post Oxygen Saturation % 96 %     2 Minute Post Liters of Oxygen 0 L            Oxygen Initial Assessment:  Oxygen Initial Assessment - 01/08/20 1021      Home Oxygen   Home Oxygen Device None    Sleep Oxygen Prescription None    Home Exercise Oxygen Prescription None    Home at Rest Exercise Oxygen Prescription None    Compliance with Home Oxygen Use No      Initial 6 min Walk   Oxygen Used None      Program Oxygen Prescription   Program Oxygen Prescription None      Intervention   Short Term Goals To learn and exhibit compliance with exercise, home and travel O2 prescription;To learn and understand importance of monitoring SPO2 with pulse oximeter and demonstrate accurate use of the pulse oximeter.;To learn and understand importance of maintaining oxygen saturations>88%;To learn and demonstrate proper pursed lip breathing techniques or other breathing techniques.;To learn and demonstrate proper use of respiratory medications    Long  Term Goals Exhibits compliance with exercise, home and travel O2 prescription;Verbalizes importance of monitoring SPO2 with pulse oximeter and return demonstration;Maintenance of O2 saturations>88%;Exhibits proper breathing techniques, such as pursed lip breathing or other  method taught during program session;Compliance with respiratory medication;Demonstrates proper use of MDI's           Oxygen Re-Evaluation:  Oxygen Re-Evaluation    Row Name 01/30/20 1447             Program Oxygen Prescription   Program Oxygen Prescription None         Home Oxygen   Home Oxygen Device None       Sleep Oxygen Prescription None       Home Exercise Oxygen Prescription None       Home at Rest Exercise Oxygen Prescription None       Compliance with Home Oxygen Use No         Goals/Expected Outcomes   Short Term Goals To learn and exhibit compliance with exercise, home and travel O2 prescription;To learn and understand importance of monitoring SPO2 with pulse oximeter and demonstrate accurate use of the pulse oximeter.;To learn and understand importance of maintaining oxygen saturations>88%;To learn and demonstrate proper pursed lip breathing techniques or other breathing techniques.;To learn and demonstrate proper use of respiratory medications       Long  Term Goals Exhibits compliance with  exercise, home and travel O2 prescription;Verbalizes importance of monitoring SPO2 with pulse oximeter and return demonstration;Maintenance of O2 saturations>88%;Exhibits proper breathing techniques, such as pursed lip breathing or other method taught during program session;Compliance with respiratory medication;Demonstrates proper use of MDI's       Goals/Expected Outcomes compliance              Oxygen Discharge (Final Oxygen Re-Evaluation):  Oxygen Re-Evaluation - 01/30/20 1447      Program Oxygen Prescription   Program Oxygen Prescription None      Home Oxygen   Home Oxygen Device None    Sleep Oxygen Prescription None    Home Exercise Oxygen Prescription None    Home at Rest Exercise Oxygen Prescription None    Compliance with Home Oxygen Use No      Goals/Expected Outcomes   Short Term Goals To learn and exhibit compliance with exercise, home and travel O2  prescription;To learn and understand importance of monitoring SPO2 with pulse oximeter and demonstrate accurate use of the pulse oximeter.;To learn and understand importance of maintaining oxygen saturations>88%;To learn and demonstrate proper pursed lip breathing techniques or other breathing techniques.;To learn and demonstrate proper use of respiratory medications    Long  Term Goals Exhibits compliance with exercise, home and travel O2 prescription;Verbalizes importance of monitoring SPO2 with pulse oximeter and return demonstration;Maintenance of O2 saturations>88%;Exhibits proper breathing techniques, such as pursed lip breathing or other method taught during program session;Compliance with respiratory medication;Demonstrates proper use of MDI's    Goals/Expected Outcomes compliance           Initial Exercise Prescription:  Initial Exercise Prescription - 01/08/20 1000      Date of Initial Exercise RX and Referring Provider   Date 01/08/20    Referring Provider Dr. Lamonte Sakai      Treadmill   MPH 2.1    Grade 1    Minutes 15      NuStep   Level 3    SPM 80    Minutes 15      Prescription Details   Frequency (times per week) 2    Duration Progress to 30 minutes of continuous aerobic without signs/symptoms of physical distress      Intensity   THRR 40-80% of Max Heartrate 60-120    Ratings of Perceived Exertion 11-13    Perceived Dyspnea 0-4      Progression   Progression Continue to progress workloads to maintain intensity without signs/symptoms of physical distress.      Resistance Training   Training Prescription Yes    Weight orange bands    Reps 10-15           Perform Capillary Blood Glucose checks as needed.  Exercise Prescription Changes:  Exercise Prescription Changes    Row Name 01/16/20 1200 01/23/20 1200 01/30/20 1400         Response to Exercise   Blood Pressure (Admit) 112/60 -- 100/60     Blood Pressure (Exercise) 132/76 -- 106/84     Blood  Pressure (Exit) 114/70 -- 96/54     Heart Rate (Admit) 76 bpm -- 67 bpm     Heart Rate (Exercise) 87 bpm -- 98 bpm     Heart Rate (Exit) 86 bpm -- 77 bpm     Oxygen Saturation (Admit) 94 % -- 95 %     Oxygen Saturation (Exercise) 92 % -- 91 %     Oxygen Saturation (Exit) 94 % -- 94 %  Rating of Perceived Exertion (Exercise) 11 -- 13     Perceived Dyspnea (Exercise) 1 -- 1     Duration Continue with 30 min of aerobic exercise without signs/symptoms of physical distress. -- Continue with 30 min of aerobic exercise without signs/symptoms of physical distress.     Intensity THRR unchanged -- THRR unchanged       Progression   Progression Continue to progress workloads to maintain intensity without signs/symptoms of physical distress. -- Continue to progress workloads to maintain intensity without signs/symptoms of physical distress.       Resistance Training   Training Prescription Yes -- Yes     Weight orange bands -- orange bands     Reps 10-15 -- 10-15     Time -- -- 10 Minutes       Treadmill   MPH 2.1 -- 2.3     Grade 1 -- 1.5     Minutes 15 -- 15       NuStep   Level 3 -- 5     SPM 80 -- 80     Minutes 15 -- 15     METs 2.2 -- 2.6       Home Exercise Plan   Plans to continue exercise at -- Home (comment) --     Frequency -- Add 3 additional days to program exercise sessions. --     Initial Home Exercises Provided -- 01/23/20 --            Exercise Comments:  Exercise Comments    Row Name 01/23/20 1211           Exercise Comments home exercise complete              Exercise Goals and Review:  Exercise Goals    Row Name 01/08/20 1027 01/30/20 1448           Exercise Goals   Increase Physical Activity Yes Yes      Intervention Provide advice, education, support and counseling about physical activity/exercise needs.;Develop an individualized exercise prescription for aerobic and resistive training based on initial evaluation findings, risk  stratification, comorbidities and participant's personal goals. Provide advice, education, support and counseling about physical activity/exercise needs.;Develop an individualized exercise prescription for aerobic and resistive training based on initial evaluation findings, risk stratification, comorbidities and participant's personal goals.      Expected Outcomes Short Term: Attend rehab on a regular basis to increase amount of physical activity.;Long Term: Add in home exercise to make exercise part of routine and to increase amount of physical activity.;Long Term: Exercising regularly at least 3-5 days a week. Short Term: Attend rehab on a regular basis to increase amount of physical activity.;Long Term: Add in home exercise to make exercise part of routine and to increase amount of physical activity.;Long Term: Exercising regularly at least 3-5 days a week.      Increase Strength and Stamina Yes Yes      Intervention Provide advice, education, support and counseling about physical activity/exercise needs.;Develop an individualized exercise prescription for aerobic and resistive training based on initial evaluation findings, risk stratification, comorbidities and participant's personal goals. Provide advice, education, support and counseling about physical activity/exercise needs.;Develop an individualized exercise prescription for aerobic and resistive training based on initial evaluation findings, risk stratification, comorbidities and participant's personal goals.      Expected Outcomes Short Term: Increase workloads from initial exercise prescription for resistance, speed, and METs.;Short Term: Perform resistance training exercises routinely during rehab and add  in resistance training at home;Long Term: Improve cardiorespiratory fitness, muscular endurance and strength as measured by increased METs and functional capacity (6MWT) Short Term: Increase workloads from initial exercise prescription for  resistance, speed, and METs.;Short Term: Perform resistance training exercises routinely during rehab and add in resistance training at home;Long Term: Improve cardiorespiratory fitness, muscular endurance and strength as measured by increased METs and functional capacity (6MWT)      Able to understand and use rate of perceived exertion (RPE) scale Yes Yes      Intervention Provide education and explanation on how to use RPE scale Provide education and explanation on how to use RPE scale      Expected Outcomes Short Term: Able to use RPE daily in rehab to express subjective intensity level;Long Term:  Able to use RPE to guide intensity level when exercising independently Short Term: Able to use RPE daily in rehab to express subjective intensity level;Long Term:  Able to use RPE to guide intensity level when exercising independently      Able to understand and use Dyspnea scale Yes Yes      Intervention Provide education and explanation on how to use Dyspnea scale Provide education and explanation on how to use Dyspnea scale      Expected Outcomes Short Term: Able to use Dyspnea scale daily in rehab to express subjective sense of shortness of breath during exertion;Long Term: Able to use Dyspnea scale to guide intensity level when exercising independently Short Term: Able to use Dyspnea scale daily in rehab to express subjective sense of shortness of breath during exertion;Long Term: Able to use Dyspnea scale to guide intensity level when exercising independently      Knowledge and understanding of Target Heart Rate Range (THRR) Yes Yes      Intervention Provide education and explanation of THRR including how the numbers were predicted and where they are located for reference Provide education and explanation of THRR including how the numbers were predicted and where they are located for reference      Expected Outcomes Short Term: Able to state/look up THRR;Short Term: Able to use daily as guideline for  intensity in rehab;Long Term: Able to use THRR to govern intensity when exercising independently Short Term: Able to state/look up THRR;Short Term: Able to use daily as guideline for intensity in rehab;Long Term: Able to use THRR to govern intensity when exercising independently      Understanding of Exercise Prescription Yes Yes      Intervention Provide education, explanation, and written materials on patient's individual exercise prescription Provide education, explanation, and written materials on patient's individual exercise prescription      Expected Outcomes Short Term: Able to explain program exercise prescription;Long Term: Able to explain home exercise prescription to exercise independently Short Term: Able to explain program exercise prescription;Long Term: Able to explain home exercise prescription to exercise independently             Exercise Goals Re-Evaluation :  Exercise Goals Re-Evaluation    Row Name 01/30/20 1448             Exercise Goal Re-Evaluation   Exercise Goals Review Increase Physical Activity;Increase Strength and Stamina;Able to understand and use rate of perceived exertion (RPE) scale;Able to understand and use Dyspnea scale;Knowledge and understanding of Target Heart Rate Range (THRR);Understanding of Exercise Prescription       Comments Pt has completed 4 exercise sessions. She has increased her workloads every session. She has a positive attitude and  is motivated to lose weight and increase stamina. She currently exercises at 3.24 METs on the treadmill and 2.6 on the stepper. Will continue to monitor and progress as able.       Expected Outcomes Through exercise at rehab and at home, the patient will decrease shortness of breath with daily activities and feel confident in carrying out an exercise regime at home.              Discharge Exercise Prescription (Final Exercise Prescription Changes):  Exercise Prescription Changes - 01/30/20 1400       Response to Exercise   Blood Pressure (Admit) 100/60    Blood Pressure (Exercise) 106/84    Blood Pressure (Exit) 96/54    Heart Rate (Admit) 67 bpm    Heart Rate (Exercise) 98 bpm    Heart Rate (Exit) 77 bpm    Oxygen Saturation (Admit) 95 %    Oxygen Saturation (Exercise) 91 %    Oxygen Saturation (Exit) 94 %    Rating of Perceived Exertion (Exercise) 13    Perceived Dyspnea (Exercise) 1    Duration Continue with 30 min of aerobic exercise without signs/symptoms of physical distress.    Intensity THRR unchanged      Progression   Progression Continue to progress workloads to maintain intensity without signs/symptoms of physical distress.      Resistance Training   Training Prescription Yes    Weight orange bands    Reps 10-15    Time 10 Minutes      Treadmill   MPH 2.3    Grade 1.5    Minutes 15      NuStep   Level 5    SPM 80    Minutes 15    METs 2.6           Nutrition:  Target Goals: Understanding of nutrition guidelines, daily intake of sodium <1571m, cholesterol <2055m calories 30% from fat and 7% or less from saturated fats, daily to have 5 or more servings of fruits and vegetables.  Biometrics:    Nutrition Therapy Plan and Nutrition Goals:  Nutrition Therapy & Goals - 01/18/20 1141      Nutrition Therapy   Diet Generally healthy      Personal Nutrition Goals   Nutrition Goal Pt to identify food quantities necessary to achieve weight loss of 6-24 lb at graduation from pulmonary rehab    Personal Goal #2 Pt to build a healthy plate including vegetables, fruits, whole grains, and low-fat dairy products in a generally healthy meal plan.      Intervention Plan   Intervention Prescribe, educate and counsel regarding individualized specific dietary modifications aiming towards targeted core components such as weight, hypertension, lipid management, diabetes, heart failure and other comorbidities.    Expected Outcomes Short Term Goal: A plan has been  developed with personal nutrition goals set during dietitian appointment.           Nutrition Assessments:  Nutrition Assessments - 01/18/20 1142      Rate Your Plate Scores   Pre Score 69           Nutrition Goals Re-Evaluation:  Nutrition Goals Re-Evaluation    Row Name 01/18/20 1142             Goals   Current Weight 229 lb (103.9 kg)       Nutrition Goal Pt to identify food quantities necessary to achieve weight loss of 6-24 lb at graduation from pulmonary rehab  Personal Goal #2 Re-Evaluation   Personal Goal #2 Pt to build a healthy plate including vegetables, fruits, whole grains, and low-fat dairy products in a generally healthy meal plan.              Nutrition Goals Discharge (Final Nutrition Goals Re-Evaluation):  Nutrition Goals Re-Evaluation - 01/18/20 1142      Goals   Current Weight 229 lb (103.9 kg)    Nutrition Goal Pt to identify food quantities necessary to achieve weight loss of 6-24 lb at graduation from pulmonary rehab      Personal Goal #2 Re-Evaluation   Personal Goal #2 Pt to build a healthy plate including vegetables, fruits, whole grains, and low-fat dairy products in a generally healthy meal plan.           Psychosocial: Target Goals: Acknowledge presence or absence of significant depression and/or stress, maximize coping skills, provide positive support system. Participant is able to verbalize types and ability to use techniques and skills needed for reducing stress and depression.  Initial Review & Psychosocial Screening:   Quality of Life Scores:  Scores of 19 and below usually indicate a poorer quality of life in these areas.  A difference of  2-3 points is a clinically meaningful difference.  A difference of 2-3 points in the total score of the Quality of Life Index has been associated with significant improvement in overall quality of life, self-image, physical symptoms, and general health in studies assessing change in  quality of life.  PHQ-9: Recent Review Flowsheet Data    Depression screen [facility] 2/9 01/08/2020   Decreased Interest 0   Down, Depressed, Hopeless 0   PHQ - 2 Score 0   Altered sleeping 3    Tired, decreased energy 0   Change in appetite 0   Feeling bad or failure about yourself  0   Trouble concentrating 0   Moving slowly or fidgety/restless 0   Suicidal thoughts 0   Difficult doing work/chores Not difficult at all     Interpretation of Total Score  Total Score Depression Severity:  1-4 = Minimal depression, 5-9 = Mild depression, 10-14 = Moderate depression, 15-19 = Moderately severe depression, 20-27 = Severe depression   Psychosocial Evaluation and Intervention:  Psychosocial Evaluation - 01/08/20 0952      Psychosocial Evaluation & Interventions   Interventions Encouraged to exercise with the program and follow exercise prescription    Comments No barriers or psychosocial concerns identified    Expected Outcomes For patient to have no concerns while participating in pulmonary rehab    Continue Psychosocial Services  No Follow up required           Psychosocial Re-Evaluation:  Psychosocial Re-Evaluation    [place] Name 01/29/20 1031 01/29/20 1151           Psychosocial Re-Evaluation   Current issues with None Identified None Identified      Comments No barriers or psychosocial concerns identified at this time. No barriers or psychosocial concerns identified at this time.      Expected Outcomes For patient to be free of psychosocial concerns while participating in pulmonary rehab. For Mischelle to have no barriers or psychosocial concerns while participating in pulmonary rehab.      Interventions Encouraged to attend Pulmonary Rehabilitation for the exercise Encouraged to attend Pulmonary Rehabilitation for the exercise      Continue Psychosocial Services  No Follow up required No Follow up required  Psychosocial Discharge (Final Psychosocial Re-Evaluation):   Psychosocial Re-Evaluation - 01/29/20 1151      Psychosocial Re-Evaluation   Current issues with None Identified    Comments No barriers or psychosocial concerns identified at this time.    Expected Outcomes For Consuelo to have no barriers or psychosocial concerns while participating in pulmonary rehab.    Interventions Encouraged to attend Pulmonary Rehabilitation for the exercise    Continue Psychosocial Services  No Follow up required           Education: Education Goals: Education classes will be provided on a weekly basis, covering required topics. Participant will state understanding/return demonstration of topics presented.  Learning Barriers/Preferences:  Learning Barriers/Preferences - 01/08/20 7628      Learning Barriers/Preferences   Learning Barriers None    Learning Preferences Written Material;Computer/Internet;Verbal Instruction;Skilled Demonstration;Individual Instruction;Group Instruction           Education Topics: Risk Factor Reduction:  -Group instruction that is supported by a PowerPoint presentation. Instructor discusses the definition of a risk factor, different risk factors for pulmonary disease, and how the heart and lungs work together.     Nutrition for Pulmonary Patient:  -Group instruction provided by PowerPoint slides, verbal discussion, and written materials to support subject matter. The instructor gives an explanation and review of healthy diet recommendations, which includes a discussion on weight management, recommendations for fruit and vegetable consumption, as well as protein, fluid, caffeine, fiber, sodium, sugar, and alcohol. Tips for eating when patients are short of breath are discussed.   Pursed Lip Breathing:  -Group instruction that is supported by demonstration and informational handouts. Instructor discusses the benefits of pursed lip and diaphragmatic breathing and detailed demonstration on how to preform both.     Oxygen Safety:   -Group instruction provided by PowerPoint, verbal discussion, and written material to support subject matter. There is an overview of "What is Oxygen" and "Why do we need it".  Instructor also reviews how to create a safe environment for oxygen use, the importance of using oxygen as prescribed, and the risks of noncompliance. There is a brief discussion on traveling with oxygen and resources the patient may utilize.   Oxygen Equipment:  -Group instruction provided by [facility] Staff utilizing handouts, written materials, and equipment demonstrations.   Signs and Symptoms:  -Group instruction provided by written material and verbal discussion to support subject matter. Warning signs and symptoms of infection, stroke, and heart attack are reviewed and when to call the physician/911 reinforced. Tips for preventing the spread of infection discussed.   Advanced Directives:  -Group instruction provided by verbal instruction and written material to support subject matter. Instructor reviews Advanced Directive laws and proper instruction for filling out document.   Pulmonary Video:  -Group video education that reviews the importance of medication and oxygen compliance, exercise, good nutrition, pulmonary hygiene, and pursed lip and diaphragmatic breathing for the pulmonary patient.   Exercise for the Pulmonary Patient:  -Group instruction that is supported by a PowerPoint presentation. Instructor discusses benefits of exercise, core components of exercise, frequency, duration, and intensity of an exercise routine, importance of utilizing pulse oximetry during exercise, safety while exercising, and options of places to exercise outside of rehab.     Pulmonary Medications:  -Verbally interactive group education provided by instructor with focus on inhaled medications and proper administration.   PULMONARY REHAB CHRONIC OBSTRUCTIVE PULMONARY DISEASE from 01/23/2020 in [place]  Date 01/23/20  Educator Handout  Anatomy and Physiology of the Respiratory System and Intimacy:  -Group instruction provided by PowerPoint, verbal discussion, and written material to support subject matter. Instructor reviews respiratory cycle and anatomical components of the respiratory system and their functions. Instructor also reviews differences in obstructive and restrictive respiratory diseases with examples of each. Intimacy, Sex, and Sexuality differences are reviewed with a discussion on how relationships can change when diagnosed with pulmonary disease. Common sexual concerns are reviewed.   MD DAY -A group question and answer session with a medical doctor that allows participants to ask questions that relate to their pulmonary disease state.   OTHER EDUCATION -Group or individual verbal, written, or video instructions that support the educational goals of the pulmonary rehab program.   PULMONARY REHAB CHRONIC OBSTRUCTIVE PULMONARY DISEASE from 01/23/2020 in [place]  Date 01/16/20  [MET level ]  Educator DF  Instruction Review Code 2- Demonstrated Understanding      Holiday Eating Survival Tips:  -Group instruction provided by PowerPoint slides, verbal discussion, and written materials to support subject matter. The instructor gives patients tips, tricks, and techniques to help them not only survive but enjoy the holidays despite the onslaught of food that accompanies the holidays.   Knowledge Questionnaire Score:  Knowledge Questionnaire Score - 01/08/20 1001      Knowledge Questionnaire Score   Pre Score 16/18           Core Components/Risk Factors/Patient Goals at Admission:  Personal Goals and Risk Factors at Admission - 01/08/20 0954      Core Components/Risk Factors/Patient Goals on Admission   Improve shortness of breath with ADL's Yes    Intervention Provide education, individualized exercise plan and  daily activity instruction to help decrease symptoms of SOB with activities of daily living.    Expected Outcomes Short Term: Improve cardiorespiratory fitness to achieve a reduction of symptoms when performing ADLs;Long Term: Be able to perform more ADLs without symptoms or delay the onset of symptoms           Core Components/Risk Factors/Patient Goals Review:   Goals and Risk Factor Review    Row Name 01/08/20 0954 01/29/20 1036 01/29/20 1153         Core Components/Risk Factors/Patient Goals Review   Personal Goals Review Increase knowledge of respiratory medications and ability to use respiratory devices properly.;Improve shortness of breath with ADL's;Develop more efficient breathing techniques such as purse lipped breathing and diaphragmatic breathing and practicing self-pacing with activity. Increase knowledge of respiratory medications and ability to use respiratory devices properly.;Improve shortness of breath with ADL's;Develop more efficient breathing techniques such as purse lipped breathing and diaphragmatic breathing and practicing self-pacing with activity. (P)  Increase knowledge of respiratory medications and ability to use respiratory devices properly.;Improve shortness of breath with ADL's;Develop more efficient breathing techniques such as purse lipped breathing and diaphragmatic breathing and practicing self-pacing with activity.     Review -- -- Magdalene just started the program, has attended 3 exercise sessions, too early to have met any program goals.     Expected Outcomes -- -- See admission goals.            Core Components/Risk Factors/Patient Goals at Discharge (Final Review):   Goals and Risk Factor Review - 01/29/20 1153      Core Components/Risk Factors/Patient Goals Review   Personal Goals Review Increase knowledge of respiratory medications and ability to use respiratory devices properly.;Improve shortness of breath with ADL's;Develop more efficient breathing  techniques  such as purse lipped breathing and diaphragmatic breathing and practicing self-pacing with activity.    Review Sala just started the program, has attended 3 exercise sessions, too early to have met any program goals.    Expected Outcomes See admission goals.           ITP Comments:   Comments: ITP REVIEW Pt is making expected progress toward pulmonary rehab goals after completing 4 sessions. Recommend continued exercise, life style modification, education, and utilization of breathing techniques to increase stamina and strength and decrease shortness of breath with exertion.

## 2020-02-01 ENCOUNTER — Encounter (HOSPITAL_COMMUNITY)
Admission: RE | Admit: 2020-02-01 | Discharge: 2020-02-01 | Disposition: A | Discharge status: Home / Self Care | Payer: PPO | Source: Ambulatory Visit | Attending: Emergency Medicine | Admitting: Emergency Medicine

## 2020-02-01 ENCOUNTER — Other Ambulatory Visit: Payer: Self-pay

## 2020-02-01 DIAGNOSIS — J449 Chronic obstructive pulmonary disease, unspecified: Secondary | ICD-10-CM | POA: Diagnosis not present

## 2020-02-01 NOTE — Progress Notes (Signed)
Daily Session Note  Patient Details  Name: CHEVONNE BOSTROM MRN: [id number] Date of Birth: [birth year] Referring Provider:     Pulmonary Rehab Walk Test from 01/08/2020 in [place]  Referring Provider Dr. Lamonte Sakai      Encounter Date: 02/01/2020  Check In:  Session Check In - 02/01/20 1020      Check-In   Supervising physician immediately available to respond to emergencies [facility] immediately available    Physician(s) Dr. Coralee Pesa    Location [facility]    Virtual Visit No    Medication changes reported     No    Fall or balance concerns reported    No    Tobacco Cessation No Change    Warm-up and Cool-down Performed on first and last piece of equipment    Resistance Training Performed Yes    VAD Patient? No    PAD/SET Patient? No      Pain Assessment   Currently in Pain? No/denies    Multiple Pain Sites No           Capillary Blood Glucose: No results found for this or any previous visit (from the past 24 hour(s)).    Social History   Tobacco Use  Smoking Status Former Smoker  . Packs/day: 1.00  . Years: 38.00  . Pack years: 38.00  . Quit date: 03/14/2009  . Years since quitting: 10.8  Smokeless Tobacco Never Used    Goals Met:  Exercise tolerated well No report of cardiac concerns or symptoms Strength training completed today  Goals Unmet:  Not Applicable  Comments: Service time is from 1015 to 1110    Dr. Fransico Him is Medical Director for Cardiac Rehab at [facility].

## 2020-02-06 ENCOUNTER — Encounter (HOSPITAL_COMMUNITY)
Admission: RE | Admit: 2020-02-06 | Discharge: 2020-02-06 | Disposition: A | Discharge status: Home / Self Care | Payer: PPO | Source: Ambulatory Visit | Attending: Emergency Medicine | Admitting: Emergency Medicine

## 2020-02-06 ENCOUNTER — Other Ambulatory Visit: Payer: Self-pay

## 2020-02-06 DIAGNOSIS — J449 Chronic obstructive pulmonary disease, unspecified: Secondary | ICD-10-CM | POA: Diagnosis not present

## 2020-02-06 NOTE — Progress Notes (Signed)
Daily Session Note  Patient Details  Name: Annette [name] MRN: [id number] Date of Birth: [birth year] Referring Provider:     Pulmonary Rehab Walk Test from 01/08/2020 in [place]  Referring Provider Dr. Lamonte Sakai      Encounter Date: 02/06/2020  Check In:  Session Check In - 02/06/20 1143      Check-In   Supervising physician immediately available to respond to emergencies [facility] immediately available    Physician(s) Dr. Coralee Pesa    Location [facility]    Staff Present Rosebud Poles, RN, Bjorn Loser, MS, CEP, Exercise Physiologist;Lisa Jani Gravel, MS, ACSM-CEP, Exercise Physiologist    Virtual Visit No    Medication changes reported     No    Fall or balance concerns reported    No    Tobacco Cessation No Change    Warm-up and Cool-down Performed on first and last piece of equipment    Resistance Training Performed Yes    VAD Patient? No    PAD/SET Patient? No      Pain Assessment   Currently in Pain? No/denies    Multiple Pain Sites No           Capillary Blood Glucose: No results found for this or any previous visit (from the past 24 hour(s)).    Social History   Tobacco Use  Smoking Status Former Smoker  . Packs/day: 1.00  . Years: 38.00  . Pack years: 38.00  . Quit date: 03/14/2009  . Years since quitting: 10.9  Smokeless Tobacco Never Used    Goals Met:  Independence with exercise equipment Exercise tolerated well Strength training completed today  Goals Unmet:  Not Applicable  Comments: Service time is from 1020 to 1125    Dr. Fransico Him is Medical Director for Cardiac Rehab at [facility].

## 2020-02-08 ENCOUNTER — Other Ambulatory Visit: Payer: Self-pay

## 2020-02-08 ENCOUNTER — Encounter (HOSPITAL_COMMUNITY)
Admission: RE | Admit: 2020-02-08 | Discharge: 2020-02-08 | Disposition: A | Discharge status: Home / Self Care | Payer: PPO | Source: Ambulatory Visit | Attending: Emergency Medicine | Admitting: Emergency Medicine

## 2020-02-08 DIAGNOSIS — J449 Chronic obstructive pulmonary disease, unspecified: Secondary | ICD-10-CM | POA: Diagnosis not present

## 2020-02-08 NOTE — Progress Notes (Signed)
Daily Session Note  Patient Details  Name: PATRA GHERARDI MRN: [id number] Date of Birth: 1950-10-13 Referring Provider:     Pulmonary Rehab Walk Test from 01/08/2020 in [place]  Referring Provider Dr. Lamonte Sakai      Encounter Date: 02/08/2020  Check In:  Session Check In - 02/08/20 1020      Check-In   Supervising physician immediately available to respond to emergencies [facility] immediately available    Physician(s) Dr. Rodena Piety    [facility]    Staff Present Rosebud Poles, RN, Bjorn Loser, MS, CEP, Exercise Physiologist;Denine Brotz Jani Gravel, MS, ACSM-CEP, Exercise Physiologist    Virtual Visit No    Medication changes reported     No    Fall or balance concerns reported    No    Tobacco Cessation No Change    Warm-up and Cool-down Performed on first and last piece of equipment    Resistance Training Performed Yes    VAD Patient? No    PAD/SET Patient? No      Pain Assessment   Currently in Pain? No/denies    Multiple Pain Sites No           Capillary Blood Glucose: No results found for this or any previous visit (from the past 24 hour(s)).    Social History   Tobacco Use  Smoking Status Former Smoker  . Packs/day: 1.00  . Years: 38.00  . Pack years: 38.00  . Quit date: 03/14/2009  . Years since quitting: 10.9  Smokeless Tobacco Never Used    Goals Met:  Exercise tolerated well No report of cardiac concerns or symptoms Strength training completed today  Goals Unmet:  Not Applicable  Comments: Service time is from 1020 to 1120    Dr. Fransico Him is Medical Director for Cardiac Rehab at [facility].

## 2020-02-13 ENCOUNTER — Encounter (HOSPITAL_COMMUNITY)
Admission: RE | Admit: 2020-02-13 | Discharge: 2020-02-13 | Disposition: A | Discharge status: Home / Self Care | Payer: PPO | Source: Ambulatory Visit | Attending: Emergency Medicine | Admitting: Emergency Medicine

## 2020-02-13 ENCOUNTER — Other Ambulatory Visit: Payer: Self-pay

## 2020-02-13 VITALS — Wt 225.5 lb

## 2020-02-13 DIAGNOSIS — J449 Chronic obstructive pulmonary disease, unspecified: Secondary | ICD-10-CM | POA: Diagnosis not present

## 2020-02-13 NOTE — Progress Notes (Signed)
Daily Session Note  Patient Details  Name: Annette [name] MRN: [id number] Date of Birth: 1950/12/18 Referring Provider:     Pulmonary Rehab Walk Test from 01/08/2020 in [place]  Referring Provider Dr. Lamonte Sakai      Encounter Date: 02/13/2020  Check In:  Session Check In - 02/13/20 1030      Check-In   Supervising physician immediately available to respond to emergencies [facility] immediately available    Physician(s) Dr. Barth Kirks    [facility]    Staff Present Rosebud Poles, RN, Bjorn Loser, MS, CEP, Exercise Physiologist;Lisa Jani Gravel, MS, ACSM-CEP, Exercise Physiologist    Virtual Visit No    Medication changes reported     No    Fall or balance concerns reported    No    Tobacco Cessation No Change    Warm-up and Cool-down Performed on first and last piece of equipment    Resistance Training Performed Yes    VAD Patient? No    PAD/SET Patient? No      Pain Assessment   Currently in Pain? No/denies    Multiple Pain Sites No           Capillary Blood Glucose: No results found for this or any previous visit (from the past 24 hour(s)).   Exercise Prescription Changes - 02/13/20 1200      Response to Exercise   Blood Pressure (Admit) 110/70    Blood Pressure (Exercise) 120/70    Blood Pressure (Exit) 110/62    Heart Rate (Admit) 63 bpm    Heart Rate (Exercise) 100 bpm    Heart Rate (Exit) 70 bpm    Oxygen Saturation (Admit) 96 %    Oxygen Saturation (Exercise) 93 %    Oxygen Saturation (Exit) 93 %    Rating of Perceived Exertion (Exercise) 11    Perceived Dyspnea (Exercise) 2    Duration Continue with 30 min of aerobic exercise without signs/symptoms of physical distress.    Intensity THRR unchanged      Progression   Progression Continue to progress workloads to maintain intensity without signs/symptoms of physical distress.      Resistance Training   Training  Prescription Yes    Weight blue bands    Reps 10-15    Time 10 Minutes      Treadmill   MPH 2.4    Grade 1.5    Minutes 15      NuStep   Level 5    SPM 80    Minutes 15    METs 2.7           Social History   Tobacco Use  Smoking Status Former Smoker  . Packs/day: 1.00  . Years: 38.00  . Pack years: 38.00  . Quit date: 03/14/2009  . Years since quitting: 10.9  Smokeless Tobacco Never Used    Goals Met:  Proper associated with RPD/PD & O2 Sat Independence with exercise equipment Improved SOB with ADL's Exercise tolerated well Strength training completed today  Goals Unmet:  Not Applicable  Comments: Service time is from 1025 to 1120    Dr. Fransico Him is Medical Director for Cardiac Rehab at [facility].

## 2020-02-14 ENCOUNTER — Ambulatory Visit
Admission: RE | Admit: 2020-02-14 | Discharge: 2020-02-14 | Disposition: A | Discharge status: Home / Self Care | Payer: PPO | Source: Ambulatory Visit | Attending: Family Medicine | Admitting: Family Medicine

## 2020-02-14 DIAGNOSIS — Z780 Asymptomatic menopausal state: Secondary | ICD-10-CM | POA: Diagnosis not present

## 2020-02-14 DIAGNOSIS — M810 Age-related osteoporosis without current pathological fracture: Secondary | ICD-10-CM | POA: Diagnosis not present

## 2020-02-14 DIAGNOSIS — M8580 Other specified disorders of bone density and structure, unspecified site: Secondary | ICD-10-CM

## 2020-02-14 DIAGNOSIS — M8588 Other specified disorders of bone density and structure, other site: Secondary | ICD-10-CM | POA: Diagnosis not present

## 2020-02-15 ENCOUNTER — Other Ambulatory Visit: Payer: Self-pay

## 2020-02-15 ENCOUNTER — Encounter (HOSPITAL_COMMUNITY)
Admission: RE | Admit: 2020-02-15 | Discharge: 2020-02-15 | Disposition: A | Discharge status: Home / Self Care | Payer: PPO | Source: Ambulatory Visit | Attending: Emergency Medicine | Admitting: Emergency Medicine

## 2020-02-15 DIAGNOSIS — J449 Chronic obstructive pulmonary disease, unspecified: Secondary | ICD-10-CM | POA: Diagnosis not present

## 2020-02-15 NOTE — Progress Notes (Signed)
Daily Session Note  Patient Details  Name: Annette [name] MRN: [id number] Date of Birth: 1951-02-27 Referring Provider:     Pulmonary Rehab Walk Test from 01/08/2020 in [place]  Referring Provider Dr. Lamonte Sakai      Encounter Date: 02/15/2020  Check In:  Session Check In - 02/15/20 1017      Check-In   Supervising physician immediately available to respond to emergencies [facility] immediately available    Physician(s) Dr. Rodena Piety    Location [facility]    Staff Present Rosebud Poles, RN, Bjorn Loser, MS, CEP, Exercise Physiologist;Lisa Jani Gravel, MS, ACSM-CEP, Exercise Physiologist    Virtual Visit No    Medication changes reported     No    Fall or balance concerns reported    No    Tobacco Cessation No Change    Warm-up and Cool-down Performed on first and last piece of equipment    Resistance Training Performed Yes    VAD Patient? No    PAD/SET Patient? No      Pain Assessment   Currently in Pain? No/denies    Multiple Pain Sites No           Capillary Blood Glucose: No results found for this or any previous visit (from the past 24 hour(s)).    Social History   Tobacco Use  Smoking Status Former Smoker  . Packs/day: 1.00  . Years: 38.00  . Pack years: 38.00  . Quit date: 03/14/2009  . Years since quitting: 10.9  Smokeless Tobacco Never Used    Goals Met:  Proper associated with RPD/PD & O2 Sat Exercise tolerated well Strength training completed today  Goals Unmet:  Not Applicable  Comments: Service time is from 1020 to 1120    Dr. Fransico Him is Medical Director for Cardiac Rehab at [facility].

## 2020-02-20 ENCOUNTER — Other Ambulatory Visit: Payer: Self-pay

## 2020-02-20 ENCOUNTER — Encounter (HOSPITAL_COMMUNITY)
Admission: RE | Admit: 2020-02-20 | Discharge: 2020-02-20 | Disposition: A | Discharge status: Home / Self Care | Payer: PPO | Source: Ambulatory Visit | Attending: Emergency Medicine | Admitting: Emergency Medicine

## 2020-02-20 DIAGNOSIS — J449 Chronic obstructive pulmonary disease, unspecified: Secondary | ICD-10-CM

## 2020-02-20 NOTE — Progress Notes (Signed)
Daily Session Note  Patient Details  Name: Annette [name] MRN: [id number] Date of Birth: 30-Oct-1950 Referring Provider:     Pulmonary Rehab Walk Test from 01/08/2020 in [place]  Referring Provider Dr. Lamonte Sakai      Encounter Date: 02/20/2020  Check In:  Session Check In - 02/20/20 [place]      Check-In   Supervising physician immediately available to respond to emergencies [facility] immediately available    Physician(s) Dr. Rodena Piety    [facility]    Staff Present Rosebud Poles, RN, BSN;Carlette Wilber Oliphant, RN, Roque Cash, RN    Virtual Visit No    Medication changes reported     No    Fall or balance concerns reported    No    Tobacco Cessation No Change    Warm-up and Cool-down Performed as group-led instruction    Resistance Training Performed Yes    VAD Patient? No    PAD/SET Patient? No      Pain Assessment   Currently in Pain? No/denies    Multiple Pain Sites No           Capillary Blood Glucose: No results found for this or any previous visit (from the past 24 hour(s)).    Social History   Tobacco Use  Smoking Status Former Smoker  . Packs/day: 1.00  . Years: 38.00  . Pack years: 38.00  . Quit date: 03/14/2009  . Years since quitting: 10.9  Smokeless Tobacco Never Used    Goals Met:  Proper associated with RPD/PD & O2 Sat Exercise tolerated well Strength training completed today  Goals Unmet:  Not Applicable  Comments: Service time is from 1015 to 1118.    Dr. Fransico Him is Medical Director for Cardiac Rehab at [facility].

## 2020-02-22 ENCOUNTER — Encounter (HOSPITAL_COMMUNITY)
Admission: RE | Admit: 2020-02-22 | Discharge: 2020-02-22 | Disposition: A | Discharge status: Home / Self Care | Payer: PPO | Source: Ambulatory Visit | Attending: Emergency Medicine | Admitting: Emergency Medicine

## 2020-02-22 ENCOUNTER — Other Ambulatory Visit: Payer: Self-pay

## 2020-02-22 DIAGNOSIS — J449 Chronic obstructive pulmonary disease, unspecified: Secondary | ICD-10-CM

## 2020-02-22 NOTE — Progress Notes (Signed)
Daily Session Note  Patient Details  Name: Annette [name] MRN: [id number] Date of Birth: Nov 12, 1950 Referring Provider:     Pulmonary Rehab Walk Test from 01/08/2020 in [place]  Referring Provider Dr. Lamonte Sakai      Encounter Date: 02/22/2020  Check In:  Session Check In - 02/22/20 1029      Check-In   Supervising physician immediately available to respond to emergencies [facility] immediately available    Physician(s) Dr. Sloan Leiter    Location [facility]    Staff Present Rosebud Poles, RN, Bjorn Loser, MS, CEP, Exercise Physiologist;Lisa Ysidro Evert, RN    Virtual Visit No    Medication changes reported     No    Fall or balance concerns reported    No    Tobacco Cessation No Change    Warm-up and Cool-down Performed as group-led instruction    Resistance Training Performed Yes    VAD Patient? No    PAD/SET Patient? No      Pain Assessment   Currently in Pain? No/denies    Multiple Pain Sites No           Capillary Blood Glucose: No results found for this or any previous visit (from the past 24 hour(s)).    Social History   Tobacco Use  Smoking Status Former Smoker  . Packs/day: 1.00  . Years: 38.00  . Pack years: 38.00  . Quit date: 03/14/2009  . Years since quitting: 10.9  Smokeless Tobacco Never Used    Goals Met:  Proper associated with RPD/PD & O2 Sat Exercise tolerated well Strength training completed today  Goals Unmet:  Not Applicable  Comments: Service time is from 1015 to 1122.    Dr. Fransico Him is Medical Director for Cardiac Rehab at [facility].

## 2020-02-27 ENCOUNTER — Other Ambulatory Visit: Payer: Self-pay

## 2020-02-27 ENCOUNTER — Encounter (HOSPITAL_COMMUNITY)
Admission: RE | Admit: 2020-02-27 | Discharge: 2020-02-27 | Disposition: A | Discharge status: Home / Self Care | Payer: PPO | Source: Ambulatory Visit | Attending: Emergency Medicine | Admitting: Emergency Medicine

## 2020-02-27 VITALS — Wt 224.9 lb

## 2020-02-27 DIAGNOSIS — J449 Chronic obstructive pulmonary disease, unspecified: Secondary | ICD-10-CM

## 2020-02-27 NOTE — Progress Notes (Signed)
Daily Session Note  Patient Details  Name: Annette [name] MRN: [id number] Date of Birth: August 14, 1951 Referring Provider:     Pulmonary Rehab Walk Test from 01/08/2020 in [place]  Referring Provider Dr. Lamonte Sakai      Encounter Date: 02/27/2020  Check In:  Session Check In - 02/27/20 1048      Check-In   Supervising physician immediately available to respond to emergencies [facility] immediately available    Physician(s) Dr. Sloan Leiter    [facility]    Staff Present Rosebud Poles, RN, BSN;Carlette Wilber Oliphant, RN, Bjorn Loser, MS, CEP, Exercise Physiologist    Virtual Visit No    Medication changes reported     No    Fall or balance concerns reported    No    Tobacco Cessation No Change    Warm-up and Cool-down Performed as group-led instruction    Resistance Training Performed Yes    VAD Patient? No    PAD/SET Patient? No      Pain Assessment   Currently in Pain? No/denies    Multiple Pain Sites No           Capillary Blood Glucose: No results found for this or any previous visit (from the past 24 hour(s)).   Exercise Prescription Changes - 02/27/20 1100      Response to Exercise   Blood Pressure (Admit) 104/70    Blood Pressure (Exercise) 142/70    Blood Pressure (Exit) 118/78    Heart Rate (Admit) 62 bpm    Heart Rate (Exercise) 95 bpm    Heart Rate (Exit) 72 bpm    Oxygen Saturation (Admit) 96 %    Oxygen Saturation (Exercise) 92 %    Oxygen Saturation (Exit) 94 %    Rating of Perceived Exertion (Exercise) 12    Perceived Dyspnea (Exercise) 1    Duration Continue with 30 min of aerobic exercise without signs/symptoms of physical distress.    Intensity THRR unchanged      Progression   Progression Continue to progress workloads to maintain intensity without signs/symptoms of physical distress.      Resistance Training   Training Prescription Yes    Weight blue bands    Reps 10-15    Time 10  Minutes      Treadmill   MPH 2.4    Grade 2    Minutes 15      NuStep   Level 7    SPM 80    Minutes 15    METs 3           Social History   Tobacco Use  Smoking Status Former Smoker  . Packs/day: 1.00  . Years: 38.00  . Pack years: 38.00  . Quit date: 03/14/2009  . Years since quitting: 10.9  Smokeless Tobacco Never Used    Goals Met:  Proper associated with RPD/PD & O2 Sat Exercise tolerated well Strength training completed today  Goals Unmet:  Not Applicable  Comments: Service time is from 1020 to 1125.    Dr. Fransico Him is Medical Director for Cardiac Rehab at [facility].

## 2020-02-27 NOTE — Progress Notes (Signed)
Nutrition Follow Up Note  Spoke with pt. Reviewed nutrition goals. Pt has lost 4.4 lbs since starting rehab 6 weeks ago. She is happy with her weight loss progress. She feels she is making better food choices. She states some days she feels more hungry than others. She is trying to listen to her hunger cues and eat to fullness.  She is exercising some on her treadmill at home and doing band exercises.  Encouraged pt to continue with her lifestyle changes.  Will continue to monitor pt during pulmonary rehab.  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  Benefits of adopting healthy diet reviewed with Rate My Plate survey  Continue client-centered nutrition education by RD, as part of interdisciplinary care.  Goal(s)  Pt to identify food quantities necessary to achieve weight loss of 6-24 lb at graduation from pulmonary rehab.   Pt to build a healthy plate including vegetables, fruits, whole grains, and low-fat dairy products in a generally healthy meal plan.   Plan:  Will provide client-centered nutrition education as part of interdisciplinary care  Monitor and evaluate progress toward nutrition goal with team.   Michaele Offer, MS, RDN, LDN

## 2020-02-29 ENCOUNTER — Encounter (HOSPITAL_COMMUNITY)
Admission: RE | Admit: 2020-02-29 | Discharge: 2020-02-29 | Disposition: A | Discharge status: Home / Self Care | Payer: PPO | Source: Ambulatory Visit | Attending: Emergency Medicine | Admitting: Emergency Medicine

## 2020-02-29 ENCOUNTER — Other Ambulatory Visit: Payer: Self-pay

## 2020-02-29 DIAGNOSIS — J449 Chronic obstructive pulmonary disease, unspecified: Secondary | ICD-10-CM

## 2020-02-29 NOTE — Progress Notes (Signed)
Daily Session Note  Patient Details  Name: ZABDI MIS MRN: [id number] Date of Birth: 28-Sep-1950 Referring Provider:     Pulmonary Rehab Walk Test from 01/08/2020 in [place]  Referring Provider Dr. Lamonte Sakai      Encounter Date: 02/29/2020  Check In:  Session Check In - 02/29/20 1026      Check-In   Supervising physician immediately available to respond to emergencies [facility] immediately available    Physician(s) Dr. Sloan Leiter    Location [facility]    Staff Present Rosebud Poles, RN, Bjorn Loser, MS, CEP, Exercise Physiologist;Lisa Jani Gravel, MS, ACSM-CEP, Exercise Physiologist    Virtual Visit No    Medication changes reported     No    Fall or balance concerns reported    No    Tobacco Cessation No Change    Warm-up and Cool-down Performed on first and last piece of equipment    Resistance Training Performed Yes    VAD Patient? No    PAD/SET Patient? No      Pain Assessment   Currently in Pain? No/denies    Multiple Pain Sites No           Capillary Blood Glucose: No results found for this or any previous visit (from the past 24 hour(s)).    Social History   Tobacco Use  Smoking Status Former Smoker  . Packs/day: 1.00  . Years: 38.00  . Pack years: 38.00  . Quit date: 03/14/2009  . Years since quitting: 10.9  Smokeless Tobacco Never Used    Goals Met:  Independence with exercise equipment Exercise tolerated well No report of cardiac concerns or symptoms Strength training completed today  Goals Unmet:  Not Applicable  Comments: Service time is from 1028 to 1125    Dr. Fransico Him is Medical Director for Cardiac Rehab at [facility].

## 2020-02-29 NOTE — Progress Notes (Signed)
Pulmonary Individual Treatment Plan  Patient Details  Name: Annette [name] MRN: [id number] Date of Birth: 1950-10-28 Referring Provider:     Pulmonary Rehab Walk Test from 01/08/2020 in [facility]  Referring Provider Dr. Lamonte Sakai      Initial Encounter Date:    Pulmonary Rehab Walk Test from 01/08/2020 in [facility]  Date 01/08/20      Visit Diagnosis: Chronic obstructive pulmonary disease, unspecified COPD type ([place])  Patient's Home Medications on Admission:   Current Outpatient Medications:  .  albuterol (VENTOLIN HFA) 108 (90 Base) MCG/ACT inhaler, Inhale 2 puffs into the lungs every 6 (six) hours as needed for wheezing or shortness of breath., Disp: 18 g, Rfl: 5 .  ALPRAZolam (XANAX) 0.25 MG tablet, TK 1 T PO QD PRN, Disp: , Rfl:  .  aspirin EC 81 MG tablet, Take 81 mg by mouth daily., Disp: , Rfl:  .  Cholecalciferol (KP VITAMIN D3) 50 MCG (2000 UT) CAPS, Take by mouth., Disp: , Rfl:  .  hydrochlorothiazide (HYDRODIURIL) 25 MG tablet, Take 1 tablet (25 mg total) by mouth daily., Disp: 30 tablet, Rfl: 11 .  ketoconazole (NIZORAL) 2 % cream, as needed. , Disp: , Rfl:  .  levothyroxine (SYNTHROID, LEVOTHROID) 100 MCG tablet, take 1 tablet by mouth every morning ON AN EMPTY STOMACH, Disp: , Rfl: 0 .  Multiple Vitamin (MULTIVITAMIN) tablet, Take 1 tablet by mouth daily., Disp: , Rfl:  .  omeprazole (PRILOSEC) 20 MG capsule, Take 20 mg by mouth every morning., Disp: , Rfl:  .  propranolol ER (INDERAL LA) 60 MG 24 hr capsule, Take 60 mg by mouth daily., Disp: , Rfl:  .  rosuvastatin (CRESTOR) 5 MG tablet, Take 5 mg by mouth daily., Disp: , Rfl:  .  salmeterol (SEREVENT DISKUS) 50 MCG/DOSE diskus inhaler, Inhale 1 puff into the lungs in the morning and at bedtime., Disp: 1 Inhaler, Rfl: 6  Past Medical History: Past Medical History:  Diagnosis Date  . Arthritis    Right hip, end stage  . Coronary artery calcification seen  on computed tomography 02/2018   Coronary calcium score 79.  Coronary CT angiogram: Mild CAD and proximal LAD, proximal RCA and mid LCx.  Moderate plaque ostial LCx.  Mildly dilated pulmonary artery, suggestive of possible pulmonary hypertension  . Difficulty sleeping    DUE TO PAIN  . Emphysema of lung ([place])   . Fluid retention    TAKES HCTZ  . Hemorrhoids   . Hyperlipidemia   . Hypertension   . Hypothyroidism   . Irritable bowel syndrome   . Shingles 2004  . Tremors of nervous system    TAKES PROPRANOLOL TO TX  . Varicose veins     Tobacco Use: Social History   Tobacco Use  Smoking Status Former Smoker  . Packs/day: 1.00  . Years: 38.00  . Pack years: 38.00  . Quit date: 03/14/2009  . Years since quitting: 10.9  Smokeless Tobacco Never Used    Labs: Recent Review Flowsheet Data   There is no flowsheet data to display.     Capillary Blood Glucose: No results found for: GLUCAP   Pulmonary Assessment Scores:  Pulmonary Assessment Scores    Row Name 01/08/20 0942 01/08/20 1021       ADL UCSD   ADL Phase Entry Entry    SOB Score total 31 --      CAT Score   CAT Score 15 --  mMRC Score   mMRC Score -- 2          UCSD: Self-administered rating of dyspnea associated with activities of daily living (ADLs) 6-point scale (0 = "not at all" to 5 = "maximal or unable to do because of breathlessness")  Scoring Scores range from 0 to 120.  Minimally important difference is 5 units  CAT: CAT can identify the health impairment of COPD patients and is better correlated with disease progression.  CAT has a scoring range of zero to 40. The CAT score is classified into four groups of low (less than 10), medium (10 - 20), high (21-30) and very high (31-40) based on the impact level of disease on health status. A CAT score over 10 suggests significant symptoms.  A worsening CAT score could be explained by an exacerbation, poor medication adherence, poor inhaler  technique, or progression of COPD or comorbid conditions.  CAT MCID is 2 points  mMRC: mMRC (Modified Medical Research Council) Dyspnea Scale is used to assess the degree of baseline functional disability in patients of respiratory disease due to dyspnea. No minimal important difference is established. A decrease in score of 1 point or greater is considered a positive change.   Pulmonary Function Assessment:  Pulmonary Function Assessment - 01/08/20 0938      Initial Spirometry Results   FEV1% 58 %      Breath   Bilateral Breath Sounds Clear    Shortness of Breath Yes;Panic with Shortness of Breath;Limiting activity           Exercise Target Goals: Exercise Program Goal: Individual exercise prescription set using results from initial 6 min walk test and THRR while considering  patient's activity barriers and safety.   Exercise Prescription Goal: Initial exercise prescription builds to 30-45 minutes a day of aerobic activity, 2-3 days per week.  Home exercise guidelines will be given to patient during program as part of exercise prescription that the participant will acknowledge.  Activity Barriers & Risk Stratification:  Activity Barriers & Cardiac Risk Stratification - 01/08/20 0936      Activity Barriers & Cardiac Risk Stratification   Activity Barriers Deconditioning;Left Hip Replacement;Right Hip Replacement;Shortness of Breath;Muscular Weakness           6 Minute Walk:  6 Minute Walk    Row Name 01/08/20 1021         6 Minute Walk   Phase Initial     Distance 1380 feet     Walk Time 6 minutes     # of Rest Breaks 0     MPH 2.61     METS 2.54     RPE 11     Perceived Dyspnea  1     VO2 Peak 8.9     Symptoms No     Resting HR 56 bpm     Resting BP 124/80     Resting Oxygen Saturation  93 %     Exercise Oxygen Saturation  during 6 min walk 89 %     Max Ex. HR 96 bpm     Max Ex. BP 134/68     2 Minute Post BP 114/72       Interval HR   1 Minute HR 89      2 Minute HR 95     3 Minute HR 94     4 Minute HR 96     5 Minute HR 94     6 Minute HR 93  2 Minute Post HR 63     Interval Heart Rate? Yes       Interval Oxygen   Interval Oxygen? Yes     Baseline Oxygen Saturation % 93 %     1 Minute Oxygen Saturation % 94 %     1 Minute Liters of Oxygen 0 L     2 Minute Oxygen Saturation % 94 %     2 Minute Liters of Oxygen 0 L     3 Minute Oxygen Saturation % 94 %     3 Minute Liters of Oxygen 0 L     4 Minute Oxygen Saturation % 89 %     4 Minute Liters of Oxygen 0 L     5 Minute Oxygen Saturation % 89 %     5 Minute Liters of Oxygen 0 L     6 Minute Oxygen Saturation % 94 %     6 Minute Liters of Oxygen 0 L     2 Minute Post Oxygen Saturation % 96 %     2 Minute Post Liters of Oxygen 0 L            Oxygen Initial Assessment:  Oxygen Initial Assessment - 01/08/20 1021      Home Oxygen   Home Oxygen Device None    Sleep Oxygen Prescription None    Home Exercise Oxygen Prescription None    Home at Rest Exercise Oxygen Prescription None    Compliance with Home Oxygen Use No      Initial 6 min Walk   Oxygen Used None      Program Oxygen Prescription   Program Oxygen Prescription None      Intervention   Short Term Goals To learn and exhibit compliance with exercise, home and travel O2 prescription;To learn and understand importance of monitoring SPO2 with pulse oximeter and demonstrate accurate use of the pulse oximeter.;To learn and understand importance of maintaining oxygen saturations>88%;To learn and demonstrate proper pursed lip breathing techniques or other breathing techniques.;To learn and demonstrate proper use of respiratory medications    Long  Term Goals Exhibits compliance with exercise, home and travel O2 prescription;Verbalizes importance of monitoring SPO2 with pulse oximeter and return demonstration;Maintenance of O2 saturations>88%;Exhibits proper breathing techniques, such as pursed lip breathing or other  method taught during program session;Compliance with respiratory medication;Demonstrates proper use of MDI's           Oxygen Re-Evaluation:  Oxygen Re-Evaluation    Row Name 01/30/20 1447 02/27/20 0708           Program Oxygen Prescription   Program Oxygen Prescription None None        Home Oxygen   Home Oxygen Device None None      Sleep Oxygen Prescription None None      Home Exercise Oxygen Prescription None None      Home at Rest Exercise Oxygen Prescription None None      Compliance with Home Oxygen Use No Yes        Goals/Expected Outcomes   Short Term Goals To learn and exhibit compliance with exercise, home and travel O2 prescription;To learn and understand importance of monitoring SPO2 with pulse oximeter and demonstrate accurate use of the pulse oximeter.;To learn and understand importance of maintaining oxygen saturations>88%;To learn and demonstrate proper pursed lip breathing techniques or other breathing techniques.;To learn and demonstrate proper use of respiratory medications To learn and exhibit compliance with exercise, home and travel O2 prescription;To learn  and understand importance of monitoring SPO2 with pulse oximeter and demonstrate accurate use of the pulse oximeter.;To learn and understand importance of maintaining oxygen saturations>88%;To learn and demonstrate proper pursed lip breathing techniques or other breathing techniques.;To learn and demonstrate proper use of respiratory medications      Long  Term Goals Exhibits compliance with exercise, home and travel O2 prescription;Verbalizes importance of monitoring SPO2 with pulse oximeter and return demonstration;Maintenance of O2 saturations>88%;Exhibits proper breathing techniques, such as pursed lip breathing or other method taught during program session;Compliance with respiratory medication;Demonstrates proper use of MDI's Exhibits compliance with exercise, home and travel O2 prescription;Verbalizes  importance of monitoring SPO2 with pulse oximeter and return demonstration;Maintenance of O2 saturations>88%;Exhibits proper breathing techniques, such as pursed lip breathing or other method taught during program session;Compliance with respiratory medication;Demonstrates proper use of MDI's      Goals/Expected Outcomes compliance compliance             Oxygen Discharge (Final Oxygen Re-Evaluation):  Oxygen Re-Evaluation - 02/27/20 0708      Program Oxygen Prescription   Program Oxygen Prescription None      Home Oxygen   Home Oxygen Device None    Sleep Oxygen Prescription None    Home Exercise Oxygen Prescription None    Home at Rest Exercise Oxygen Prescription None    Compliance with Home Oxygen Use Yes      Goals/Expected Outcomes   Short Term Goals To learn and exhibit compliance with exercise, home and travel O2 prescription;To learn and understand importance of monitoring SPO2 with pulse oximeter and demonstrate accurate use of the pulse oximeter.;To learn and understand importance of maintaining oxygen saturations>88%;To learn and demonstrate proper pursed lip breathing techniques or other breathing techniques.;To learn and demonstrate proper use of respiratory medications    Long  Term Goals Exhibits compliance with exercise, home and travel O2 prescription;Verbalizes importance of monitoring SPO2 with pulse oximeter and return demonstration;Maintenance of O2 saturations>88%;Exhibits proper breathing techniques, such as pursed lip breathing or other method taught during program session;Compliance with respiratory medication;Demonstrates proper use of MDI's    Goals/Expected Outcomes compliance           Initial Exercise Prescription:  Initial Exercise Prescription - 01/08/20 1000      Date of Initial Exercise RX and Referring Provider   Date 01/08/20    Referring Provider Dr. Lamonte Sakai      Treadmill   MPH 2.1    Grade 1    Minutes 15      NuStep   Level 3    SPM  80    Minutes 15      Prescription Details   Frequency (times per week) 2    Duration Progress to 30 minutes of continuous aerobic without signs/symptoms of physical distress      Intensity   THRR 40-80% of Max Heartrate 60-120    Ratings of Perceived Exertion 11-13    Perceived Dyspnea 0-4      Progression   Progression Continue to progress workloads to maintain intensity without signs/symptoms of physical distress.      Resistance Training   Training Prescription Yes    Weight orange bands    Reps 10-15           Perform Capillary Blood Glucose checks as needed.  Exercise Prescription Changes:  Exercise Prescription Changes    Row Name 01/16/20 1200 01/23/20 1200 01/30/20 1400 02/13/20 1200 02/27/20 1100     Response to Exercise   Blood  Pressure (Admit) 112/60 -- 100/60 110/70 104/70   Blood Pressure (Exercise) 132/76 -- 106/84 120/70 142/70   Blood Pressure (Exit) 114/70 -- 96/54 110/62 118/78   Heart Rate (Admit) 76 bpm -- 67 bpm 63 bpm 62 bpm   Heart Rate (Exercise) 87 bpm -- 98 bpm 100 bpm 95 bpm   Heart Rate (Exit) 86 bpm -- 77 bpm 70 bpm 72 bpm   Oxygen Saturation (Admit) 94 % -- 95 % 96 % 96 %   Oxygen Saturation (Exercise) 92 % -- 91 % 93 % 92 %   Oxygen Saturation (Exit) 94 % -- 94 % 93 % 94 %   Rating of Perceived Exertion (Exercise) 11 -- '13 11 12   ' Perceived Dyspnea (Exercise) 1 -- '1 2 1   ' Duration Continue with 30 min of aerobic exercise without signs/symptoms of physical distress. -- Continue with 30 min of aerobic exercise without signs/symptoms of physical distress. Continue with 30 min of aerobic exercise without signs/symptoms of physical distress. Continue with 30 min of aerobic exercise without signs/symptoms of physical distress.   Intensity THRR unchanged -- THRR unchanged THRR unchanged THRR unchanged     Progression   Progression Continue to progress workloads to maintain intensity without signs/symptoms of physical distress. -- Continue to  progress workloads to maintain intensity without signs/symptoms of physical distress. Continue to progress workloads to maintain intensity without signs/symptoms of physical distress. Continue to progress workloads to maintain intensity without signs/symptoms of physical distress.     Resistance Training   Training Prescription Yes -- Yes Yes Yes   Weight orange bands -- orange bands blue bands blue bands   Reps 10-15 -- 10-15 10-15 10-15   Time -- -- 10 Minutes 10 Minutes 10 Minutes     Treadmill   MPH 2.1 -- 2.3 2.4 2.4   Grade 1 -- 1.5 1.5 2   Minutes 15 -- '15 15 15     ' NuStep   Level 3 -- '5 5 7   ' SPM 80 -- 80 80 80   Minutes 15 -- '15 15 15   ' METs 2.2 -- 2.6 2.7 3     Home Exercise Plan   Plans to continue exercise at -- Home (comment) -- -- --   Frequency -- Add 3 additional days to program exercise sessions. -- -- --   Initial Home Exercises Provided -- 01/23/20 -- -- --          Exercise Comments:  Exercise Comments    Row Name 01/23/20 1211           Exercise Comments home exercise complete              Exercise Goals and Review:  Exercise Goals    Row Name 01/08/20 1027 01/30/20 1448 02/27/20 0708         Exercise Goals   Increase Physical Activity Yes Yes Yes     Intervention Provide advice, education, support and counseling about physical activity/exercise needs.;Develop an individualized exercise prescription for aerobic and resistive training based on initial evaluation findings, risk stratification, comorbidities and participant's personal goals. Provide advice, education, support and counseling about physical activity/exercise needs.;Develop an individualized exercise prescription for aerobic and resistive training based on initial evaluation findings, risk stratification, comorbidities and participant's personal goals. Provide advice, education, support and counseling about physical activity/exercise needs.;Develop an individualized exercise  prescription for aerobic and resistive training based on initial evaluation findings, risk stratification, comorbidities and participant's personal goals.  Expected Outcomes Short Term: Attend rehab on a regular basis to increase amount of physical activity.;Long Term: Add in home exercise to make exercise part of routine and to increase amount of physical activity.;Long Term: Exercising regularly at least 3-5 days a week. Short Term: Attend rehab on a regular basis to increase amount of physical activity.;Long Term: Add in home exercise to make exercise part of routine and to increase amount of physical activity.;Long Term: Exercising regularly at least 3-5 days a week. Short Term: Attend rehab on a regular basis to increase amount of physical activity.;Long Term: Add in home exercise to make exercise part of routine and to increase amount of physical activity.;Long Term: Exercising regularly at least 3-5 days a week.     Increase Strength and Stamina Yes Yes Yes     Intervention Provide advice, education, support and counseling about physical activity/exercise needs.;Develop an individualized exercise prescription for aerobic and resistive training based on initial evaluation findings, risk stratification, comorbidities and participant's personal goals. Provide advice, education, support and counseling about physical activity/exercise needs.;Develop an individualized exercise prescription for aerobic and resistive training based on initial evaluation findings, risk stratification, comorbidities and participant's personal goals. Provide advice, education, support and counseling about physical activity/exercise needs.;Develop an individualized exercise prescription for aerobic and resistive training based on initial evaluation findings, risk stratification, comorbidities and participant's personal goals.     Expected Outcomes Short Term: Increase workloads from initial exercise prescription for resistance,  speed, and METs.;Short Term: Perform resistance training exercises routinely during rehab and add in resistance training at home;Long Term: Improve cardiorespiratory fitness, muscular endurance and strength as measured by increased METs and functional capacity (6MWT) Short Term: Increase workloads from initial exercise prescription for resistance, speed, and METs.;Short Term: Perform resistance training exercises routinely during rehab and add in resistance training at home;Long Term: Improve cardiorespiratory fitness, muscular endurance and strength as measured by increased METs and functional capacity (6MWT) Short Term: Increase workloads from initial exercise prescription for resistance, speed, and METs.;Short Term: Perform resistance training exercises routinely during rehab and add in resistance training at home;Long Term: Improve cardiorespiratory fitness, muscular endurance and strength as measured by increased METs and functional capacity (6MWT)     Able to understand and use rate of perceived exertion (RPE) scale Yes Yes Yes     Intervention Provide education and explanation on how to use RPE scale Provide education and explanation on how to use RPE scale Provide education and explanation on how to use RPE scale     Expected Outcomes Short Term: Able to use RPE daily in rehab to express subjective intensity level;Long Term:  Able to use RPE to guide intensity level when exercising independently Short Term: Able to use RPE daily in rehab to express subjective intensity level;Long Term:  Able to use RPE to guide intensity level when exercising independently Short Term: Able to use RPE daily in rehab to express subjective intensity level;Long Term:  Able to use RPE to guide intensity level when exercising independently     Able to understand and use Dyspnea scale Yes Yes Yes     Intervention Provide education and explanation on how to use Dyspnea scale Provide education and explanation on how to use  Dyspnea scale Provide education and explanation on how to use Dyspnea scale     Expected Outcomes Short Term: Able to use Dyspnea scale daily in rehab to express subjective sense of shortness of breath during exertion;Long Term: Able to use Dyspnea scale to guide  intensity level when exercising independently Short Term: Able to use Dyspnea scale daily in rehab to express subjective sense of shortness of breath during exertion;Long Term: Able to use Dyspnea scale to guide intensity level when exercising independently Short Term: Able to use Dyspnea scale daily in rehab to express subjective sense of shortness of breath during exertion;Long Term: Able to use Dyspnea scale to guide intensity level when exercising independently     Knowledge and understanding of Target Heart Rate Range (THRR) Yes Yes Yes     Intervention Provide education and explanation of THRR including how the numbers were predicted and where they are located for reference Provide education and explanation of THRR including how the numbers were predicted and where they are located for reference Provide education and explanation of THRR including how the numbers were predicted and where they are located for reference     Expected Outcomes Short Term: Able to state/look up THRR;Short Term: Able to use daily as guideline for intensity in rehab;Long Term: Able to use THRR to govern intensity when exercising independently Short Term: Able to state/look up THRR;Short Term: Able to use daily as guideline for intensity in rehab;Long Term: Able to use THRR to govern intensity when exercising independently Short Term: Able to state/look up THRR;Short Term: Able to use daily as guideline for intensity in rehab;Long Term: Able to use THRR to govern intensity when exercising independently     Understanding of Exercise Prescription Yes Yes Yes     Intervention Provide education, explanation, and written materials on patient's individual exercise prescription  Provide education, explanation, and written materials on patient's individual exercise prescription Provide education, explanation, and written materials on patient's individual exercise prescription     Expected Outcomes Short Term: Able to explain program exercise prescription;Long Term: Able to explain home exercise prescription to exercise independently Short Term: Able to explain program exercise prescription;Long Term: Able to explain home exercise prescription to exercise independently Short Term: Able to explain program exercise prescription;Long Term: Able to explain home exercise prescription to exercise independently            Exercise Goals Re-Evaluation :  Exercise Goals Re-Evaluation    Row Name 01/30/20 1448 02/27/20 0709           Exercise Goal Re-Evaluation   Exercise Goals Review Increase Physical Activity;Increase Strength and Stamina;Able to understand and use rate of perceived exertion (RPE) scale;Able to understand and use Dyspnea scale;Knowledge and understanding of Target Heart Rate Range (THRR);Understanding of Exercise Prescription Increase Physical Activity;Increase Strength and Stamina;Able to understand and use rate of perceived exertion (RPE) scale;Able to understand and use Dyspnea scale;Knowledge and understanding of Target Heart Rate Range (THRR);Understanding of Exercise Prescription      Comments Pt has completed 4 exercise sessions. She has increased her workloads every session. She has a positive attitude and is motivated to lose weight and increase stamina. She currently exercises at 3.24 METs on the treadmill and 2.6 on the stepper. Will continue to monitor and progress as able. Pt has completed 11 exercise sessions. She continues to increase her workloads. She currently exercises at 3.5 METs on the treadmill and 3.0 METs on the stepper. Will continue to monitor and progress as able.      Expected Outcomes Through exercise at rehab and at home, the patient  will decrease shortness of breath with daily activities and feel confident in carrying out an exercise regime at home. Through exercise at rehab and at home, the patient will  decrease shortness of breath with daily activities and feel confident in carrying out an exercise regime at home.             Discharge Exercise Prescription (Final Exercise Prescription Changes):  Exercise Prescription Changes - 02/27/20 1100      Response to Exercise   Blood Pressure (Admit) 104/70    Blood Pressure (Exercise) 142/70    Blood Pressure (Exit) 118/78    Heart Rate (Admit) 62 bpm    Heart Rate (Exercise) 95 bpm    Heart Rate (Exit) 72 bpm    Oxygen Saturation (Admit) 96 %    Oxygen Saturation (Exercise) 92 %    Oxygen Saturation (Exit) 94 %    Rating of Perceived Exertion (Exercise) 12    Perceived Dyspnea (Exercise) 1    Duration Continue with 30 min of aerobic exercise without signs/symptoms of physical distress.    Intensity THRR unchanged      Progression   Progression Continue to progress workloads to maintain intensity without signs/symptoms of physical distress.      Resistance Training   Training Prescription Yes    Weight blue bands    Reps 10-15    Time 10 Minutes      Treadmill   MPH 2.4    Grade 2    Minutes 15      NuStep   Level 7    SPM 80    Minutes 15    METs 3           Nutrition:  Target Goals: Understanding of nutrition guidelines, daily intake of sodium <1560m, cholesterol <2027m calories 30% from fat and 7% or less from saturated fats, daily to have 5 or more servings of fruits and vegetables.  Biometrics:    Nutrition Therapy Plan and Nutrition Goals:  Nutrition Therapy & Goals - 01/18/20 1141      Nutrition Therapy   Diet Generally healthy      Personal Nutrition Goals   Nutrition Goal Pt to identify food quantities necessary to achieve weight loss of 6-24 lb at graduation from pulmonary rehab    Personal Goal #2 Pt to build a healthy plate  including vegetables, fruits, whole grains, and low-fat dairy products in a generally healthy meal plan.      Intervention Plan   Intervention Prescribe, educate and counsel regarding individualized specific dietary modifications aiming towards targeted core components such as weight, hypertension, lipid management, diabetes, heart failure and other comorbidities.    Expected Outcomes Short Term Goal: A plan has been developed with personal nutrition goals set during dietitian appointment.           Nutrition Assessments:  Nutrition Assessments - 01/18/20 1142      Rate Your Plate Scores   Pre Score 69           Nutrition Goals Re-Evaluation:  Nutrition Goals Re-Evaluation    [place] 01/18/20 1142 02/23/20 1436           Goals   Current Weight 229 lb (103.9 kg) 225 lb (102.1 kg)      Nutrition Goal Pt to identify food quantities necessary to achieve weight loss of 6-24 lb at graduation from pulmonary rehab Pt to identify food quantities necessary to achieve weight loss of 6-24 lb at graduation from pulmonary rehab      Comment -- Pt has lost 4 lbs since starting rehab.        Personal Goal #2 Re-Evaluation   Personal Goal #  2 Pt to build a healthy plate including vegetables, fruits, whole grains, and low-fat dairy products in a generally healthy meal plan. Pt to build a healthy plate including vegetables, fruits, whole grains, and low-fat dairy products in a generally healthy meal plan.             Nutrition Goals Discharge (Final Nutrition Goals Re-Evaluation):  Nutrition Goals Re-Evaluation - 02/23/20 1436      Goals   Current Weight 225 lb (102.1 kg)    Nutrition Goal Pt to identify food quantities necessary to achieve weight loss of 6-24 lb at graduation from pulmonary rehab    Comment Pt has lost 4 lbs since starting rehab.      Personal Goal #2 Re-Evaluation   Personal Goal #2 Pt to build a healthy plate including vegetables, fruits, whole grains, and low-fat  dairy products in a generally healthy meal plan.           Psychosocial: Target Goals: Acknowledge presence or absence of significant depression and/or stress, maximize coping skills, provide positive support system. Participant is able to verbalize types and ability to use techniques and skills needed for reducing stress and depression.  Initial Review & Psychosocial Screening:   Quality of Life Scores:  Scores of 19 and below usually indicate a poorer quality of life in these areas.  A difference of  2-3 points is a clinically meaningful difference.  A difference of 2-3 points in the total score of the Quality of Life Index has been associated with significant improvement in overall quality of life, self-image, physical symptoms, and general health in studies assessing change in quality of life.  PHQ-9: Recent Review Flowsheet Data    Depression screen [facility] 2/9 01/08/2020   Decreased Interest 0   Down, Depressed, Hopeless 0   PHQ - 2 Score 0   Altered sleeping 3    Tired, decreased energy 0   Change in appetite 0   Feeling bad or failure about yourself  0   Trouble concentrating 0   Moving slowly or fidgety/restless 0   Suicidal thoughts 0   Difficult doing work/chores Not difficult at all     Interpretation of Total Score  Total Score Depression Severity:  1-4 = Minimal depression, 5-9 = Mild depression, 10-14 = Moderate depression, 15-19 = Moderately severe depression, 20-27 = Severe depression   Psychosocial Evaluation and Intervention:  Psychosocial Evaluation - 01/08/20 0952      Psychosocial Evaluation & Interventions   Interventions Encouraged to exercise with the program and follow exercise prescription    Comments No barriers or psychosocial concerns identified    Expected Outcomes For patient to have no concerns while participating in pulmonary rehab    Continue Psychosocial Services  No Follow up required           Psychosocial Re-Evaluation:  Psychosocial  Re-Evaluation    [place] Name 01/29/20 1031 01/29/20 1151 02/26/20 1339         Psychosocial Re-Evaluation   Current issues with None Identified None Identified None Identified     Comments No barriers or psychosocial concerns identified at this time. No barriers or psychosocial concerns identified at this time. No barriers or psychosocial concerns have been identified.     Expected Outcomes For patient to be free of psychosocial concerns while participating in pulmonary rehab. For Othello to have no barriers or psychosocial concerns while participating in pulmonary rehab. For Deborh to continue to be free of psychsocial concerns while participating in  pulmonry rehab.     Interventions Encouraged to attend Pulmonary Rehabilitation for the exercise Encouraged to attend Pulmonary Rehabilitation for the exercise Encouraged to attend Pulmonary Rehabilitation for the exercise     Continue Psychosocial Services  No Follow up required No Follow up required No Follow up required            Psychosocial Discharge (Final Psychosocial Re-Evaluation):  Psychosocial Re-Evaluation - 02/26/20 1339      Psychosocial Re-Evaluation   Current issues with None Identified    Comments No barriers or psychosocial concerns have been identified.    Expected Outcomes For Justeen to continue to be free of psychsocial concerns while participating in [place] rehab.    Interventions Encouraged to attend Pulmonary Rehabilitation for the exercise    Continue Psychosocial Services  No Follow up required           Education: Education Goals: Education classes will be provided on a weekly basis, covering required topics. Participant will state understanding/return demonstration of topics presented.  Learning Barriers/Preferences:  Learning Barriers/Preferences - 01/08/20 3016      Learning Barriers/Preferences   Learning Barriers None    Learning Preferences Written Material;Computer/Internet;Verbal Instruction;Skilled  Demonstration;Individual Instruction;Group Instruction           Education Topics: Risk Factor Reduction:  -Group instruction that is supported by a PowerPoint presentation. Instructor discusses the definition of a risk factor, different risk factors for pulmonary disease, and how the heart and lungs work together.     PULMONARY REHAB CHRONIC OBSTRUCTIVE PULMONARY DISEASE from 02/22/2020 in [place]  Date 02/22/20  Educator handout      Nutrition for Pulmonary Patient:  -Group instruction provided by PowerPoint slides, verbal discussion, and written materials to support subject matter. The instructor gives an explanation and review of healthy diet recommendations, which includes a discussion on weight management, recommendations for fruit and vegetable consumption, as well as protein, fluid, caffeine, fiber, sodium, sugar, and alcohol. Tips for eating when patients are short of breath are discussed.   Pursed Lip Breathing:  -Group instruction that is supported by demonstration and informational handouts. Instructor discusses the benefits of pursed lip and diaphragmatic breathing and detailed demonstration on how to preform both.     PULMONARY REHAB CHRONIC OBSTRUCTIVE PULMONARY DISEASE from 02/06/2020 in [place]  Date 02/06/20  Educator handout  Instruction Review Code 2- Demonstrated Understanding      Oxygen Safety:  -Group instruction provided by PowerPoint, verbal discussion, and written material to support subject matter. There is an overview of "What is Oxygen" and "Why do we need it".  Instructor also reviews how to create a safe environment for oxygen use, the importance of using oxygen as prescribed, and the risks of noncompliance. There is a brief discussion on traveling with oxygen and resources the patient may utilize.   Oxygen Equipment:  -Group instruction provided by [facility] Staff utilizing handouts,  written materials, and equipment demonstrations.   Signs and Symptoms:  -Group instruction provided by written material and verbal discussion to support subject matter. Warning signs and symptoms of infection, stroke, and heart attack are reviewed and when to call the physician/911 reinforced. Tips for preventing the spread of infection discussed.   Advanced Directives:  -Group instruction provided by verbal instruction and written material to support subject matter. Instructor reviews Advanced Directive laws and proper instruction for filling out document.   Pulmonary Video:  -Group video education that reviews the  importance of medication and oxygen compliance, exercise, good nutrition, pulmonary hygiene, and pursed lip and diaphragmatic breathing for the pulmonary patient.   Exercise for the Pulmonary Patient:  -Group instruction that is supported by a PowerPoint presentation. Instructor discusses benefits of exercise, core components of exercise, frequency, duration, and intensity of an exercise routine, importance of utilizing pulse oximetry during exercise, safety while exercising, and options of places to exercise outside of rehab.     Pulmonary Medications:  -Verbally interactive group education provided by instructor with focus on inhaled medications and proper administration.   PULMONARY REHAB CHRONIC OBSTRUCTIVE PULMONARY DISEASE from 02/06/2020 in [place]  Date 01/23/20  Educator Handout      Anatomy and Physiology of the Respiratory System and Intimacy:  -Group instruction provided by PowerPoint, verbal discussion, and written material to support subject matter. Instructor reviews respiratory cycle and anatomical components of the respiratory system and their functions. Instructor also reviews differences in obstructive and restrictive respiratory diseases with examples of each. Intimacy, Sex, and Sexuality differences are reviewed with a  discussion on how relationships can change when diagnosed with pulmonary disease. Common sexual concerns are reviewed.   MD DAY -A group question and answer session with a medical doctor that allows participants to ask questions that relate to their pulmonary disease state.   OTHER EDUCATION -Group or individual verbal, written, or video instructions that support the educational goals of the pulmonary rehab program.   PULMONARY REHAB CHRONIC OBSTRUCTIVE PULMONARY DISEASE from 02/22/2020 in [place]  Date 02/13/20  Educator handout  [Know Your Numbers]      Holiday Eating Survival Tips:  -Group instruction provided by PowerPoint slides, verbal discussion, and written materials to support subject matter. The instructor gives patients tips, tricks, and techniques to help them not only survive but enjoy the holidays despite the onslaught of food that accompanies the holidays.   Knowledge Questionnaire Score:  Knowledge Questionnaire Score - 01/08/20 1001      Knowledge Questionnaire Score   Pre Score 16/18           Core Components/Risk Factors/Patient Goals at Admission:  Personal Goals and Risk Factors at Admission - 01/08/20 0954      Core Components/Risk Factors/Patient Goals on Admission   Improve shortness of breath with ADL's Yes    Intervention Provide education, individualized exercise plan and daily activity instruction to help decrease symptoms of SOB with activities of daily living.    Expected Outcomes Short Term: Improve cardiorespiratory fitness to achieve a reduction of symptoms when performing ADLs;Long Term: Be able to perform more ADLs without symptoms or delay the onset of symptoms           Core Components/Risk Factors/Patient Goals Review:   Goals and Risk Factor Review    Row Name 01/08/20 0954 01/29/20 1036 01/29/20 1153 02/26/20 1340       Core Components/Risk Factors/Patient Goals Review   Personal Goals Review  Increase knowledge of respiratory medications and ability to use respiratory devices properly.;Improve shortness of breath with ADL's;Develop more efficient breathing techniques such as purse lipped breathing and diaphragmatic breathing and practicing self-pacing with activity. Increase knowledge of respiratory medications and ability to use respiratory devices properly.;Improve shortness of breath with ADL's;Develop more efficient breathing techniques such as purse lipped breathing and diaphragmatic breathing and practicing self-pacing with activity. (P)  Increase knowledge of respiratory medications and ability to use respiratory devices properly.;Improve shortness of breath with ADL's;Develop more  efficient breathing techniques such as purse lipped breathing and diaphragmatic breathing and practicing self-pacing with activity. Improve shortness of breath with ADL's;Develop more efficient breathing techniques such as purse lipped breathing and diaphragmatic breathing and practicing self-pacing with activity.;Increase knowledge of respiratory medications and ability to use respiratory devices properly.    Review -- -- Zaleah just started the program, has attended 3 exercise sessions, too early to have met any program goals. Reigan has increased her workloads to level 6 on the nustep @ 3.0 mets, and is walking on the treadmill @ 2.4 mph and 2% incline.  She has lost 1 kg.    Expected Outcomes -- -- See admission goals. See admission goals.           Core Components/Risk Factors/Patient Goals at Discharge (Final Review):   Goals and Risk Factor Review - 02/26/20 1340      Core Components/Risk Factors/Patient Goals Review   Personal Goals Review Improve shortness of breath with ADL's;Develop more efficient breathing techniques such as purse lipped breathing and diaphragmatic breathing and practicing self-pacing with activity.;Increase knowledge of respiratory medications and ability to use respiratory  devices properly.    Review Latese has increased her workloads to level 6 on the nustep @ 3.0 mets, and is walking on the treadmill @ 2.4 mph and 2% incline.  She has lost 1 kg.    Expected Outcomes See admission goals.           ITP Comments:   Comments: ITP REVIEW Pt is making expected progress toward pulmonary rehab goals after completing 12 sessions. Recommend continued exercise, life style modification, education, and utilization of breathing techniques to increase stamina and strength and decrease shortness of breath with exertion.

## 2020-03-05 ENCOUNTER — Encounter (HOSPITAL_COMMUNITY)
Admission: RE | Admit: 2020-03-05 | Discharge: 2020-03-05 | Disposition: A | Discharge status: Home / Self Care | Payer: PPO | Source: Ambulatory Visit | Attending: Emergency Medicine | Admitting: Emergency Medicine

## 2020-03-05 ENCOUNTER — Other Ambulatory Visit: Payer: Self-pay

## 2020-03-05 DIAGNOSIS — J449 Chronic obstructive pulmonary disease, unspecified: Secondary | ICD-10-CM

## 2020-03-05 DIAGNOSIS — H26491 Other secondary cataract, right eye: Secondary | ICD-10-CM | POA: Diagnosis not present

## 2020-03-05 DIAGNOSIS — H2512 Age-related nuclear cataract, left eye: Secondary | ICD-10-CM | POA: Diagnosis not present

## 2020-03-05 DIAGNOSIS — H35013 Changes in retinal vascular appearance, bilateral: Secondary | ICD-10-CM | POA: Diagnosis not present

## 2020-03-05 DIAGNOSIS — H35033 Hypertensive retinopathy, bilateral: Secondary | ICD-10-CM | POA: Diagnosis not present

## 2020-03-05 NOTE — Progress Notes (Signed)
Daily Session Note  Patient Details  Name: LARCENIA HOLADAY MRN: [id number] Date of Birth: July 28, 1951 Referring Provider:     Pulmonary Rehab Walk Test from 01/08/2020 in [place]  Referring Provider Dr. Lamonte Sakai      Encounter Date: 03/05/2020  Check In:  Session Check In - 03/05/20 1058      Check-In   Supervising physician immediately available to respond to emergencies [facility] immediately available    Physician(s) Dr. Pietro Cassis    Location [facility]    Staff Present Rosebud Poles, RN, Bjorn Loser, MS, CEP, Exercise Physiologist;Lisa Jani Gravel, MS, ACSM-CEP, Exercise Physiologist    Virtual Visit No    Medication changes reported     No    Fall or balance concerns reported    No    Tobacco Cessation No Change    Warm-up and Cool-down Performed as group-led instruction    Resistance Training Performed Yes    VAD Patient? No    PAD/SET Patient? No      Pain Assessment   Currently in Pain? No/denies    Multiple Pain Sites No           Capillary Blood Glucose: No results found for this or any previous visit (from the past 24 hour(s)).    Social History   Tobacco Use  Smoking Status Former Smoker  . Packs/day: 1.00  . Years: 38.00  . Pack years: 38.00  . Quit date: 03/14/2009  . Years since quitting: 10.9  Smokeless Tobacco Never Used    Goals Met:  Proper associated with RPD/PD & O2 Sat Exercise tolerated well Strength training completed today  Goals Unmet:  Not Applicable  Comments: Service time is from 1021 to 1120.   Dr. Fransico Him is Medical Director for Cardiac Rehab at [facility]

## 2020-03-07 ENCOUNTER — Encounter (HOSPITAL_COMMUNITY)
Admission: RE | Admit: 2020-03-07 | Discharge: 2020-03-07 | Disposition: A | Discharge status: Home / Self Care | Payer: PPO | Source: Ambulatory Visit | Attending: Emergency Medicine | Admitting: Emergency Medicine

## 2020-03-07 ENCOUNTER — Other Ambulatory Visit: Payer: Self-pay

## 2020-03-07 DIAGNOSIS — J449 Chronic obstructive pulmonary disease, unspecified: Secondary | ICD-10-CM

## 2020-03-07 NOTE — Progress Notes (Signed)
Daily Session Note  Patient Details  Name: Annette [name] MRN: [id number] Date of Birth: [birth year] Referring Provider:     Pulmonary Rehab Walk Test from 01/08/2020 in [place]  Referring Provider Dr. Lamonte Sakai      Encounter Date: 03/07/2020  Check In:  Session Check In - 03/07/20 1137      Check-In   Supervising physician immediately available to respond to emergencies [facility] immediately available    Physician(s) Dr. Pietro Cassis    [facility]    Staff Present Rosebud Poles, RN, Bjorn Loser, MS, CEP, Exercise Physiologist;Lisa Jani Gravel, MS, ACSM-CEP, Exercise Physiologist    Virtual Visit No    Medication changes reported     No    Fall or balance concerns reported    No    Tobacco Cessation No Change    Warm-up and Cool-down Performed as group-led instruction    Resistance Training Performed Yes    VAD Patient? No    PAD/SET Patient? No      Pain Assessment   Currently in Pain? No/denies    Multiple Pain Sites No           Capillary Blood Glucose: No results found for this or any previous visit (from the past 24 hour(s)).    Social History   Tobacco Use  Smoking Status Former Smoker  . Packs/day: 1.00  . Years: 38.00  . Pack years: 38.00  . Quit date: 03/14/2009  . Years since quitting: 10.9  Smokeless Tobacco Never Used    Goals Met:  Independence with exercise equipment Exercise tolerated well Strength training completed today  Goals Unmet:  Not Applicable  Comments: Service time is from 1015 to 1117    Dr. Fransico Him is Medical Director for Cardiac Rehab at [facility].

## 2020-03-12 ENCOUNTER — Encounter (HOSPITAL_COMMUNITY)
Admission: RE | Admit: 2020-03-12 | Discharge: 2020-03-12 | Disposition: A | Discharge status: Home / Self Care | Payer: PPO | Source: Ambulatory Visit | Attending: Emergency Medicine | Admitting: Emergency Medicine

## 2020-03-12 ENCOUNTER — Other Ambulatory Visit: Payer: Self-pay

## 2020-03-12 VITALS — Wt 221.8 lb

## 2020-03-12 DIAGNOSIS — J449 Chronic obstructive pulmonary disease, unspecified: Secondary | ICD-10-CM | POA: Diagnosis not present

## 2020-03-12 NOTE — Progress Notes (Signed)
Daily Session Note  Patient Details  Name: Annette [name] MRN: [id number] Date of Birth: 05-21-1951 Referring Provider:     Pulmonary Rehab Walk Test from 01/08/2020 in [place]  Referring Provider Dr. Lamonte Sakai      Encounter Date: 03/12/2020  Check In:  Session Check In - 03/12/20 1104      Check-In   Supervising physician immediately available to respond to emergencies [facility] immediately available    Physician(s) Dr. Jamse Arn    [facility]    Staff Present Maurice Small, RN, Bjorn Loser, MS, CEP, Exercise Physiologist;Lisa Jani Gravel, MS, ACSM-CEP, Exercise Physiologist    Virtual Visit No    Medication changes reported     No    Fall or balance concerns reported    No    Tobacco Cessation No Change    Warm-up and Cool-down Performed as group-led instruction    Resistance Training Performed Yes    VAD Patient? No    PAD/SET Patient? No      Pain Assessment   Currently in Pain? No/denies    Multiple Pain Sites No           Capillary Blood Glucose: No results found for this or any previous visit (from the past 24 hour(s)).   Exercise Prescription Changes - 03/12/20 1100      Response to Exercise   Blood Pressure (Admit) 102/72    Blood Pressure (Exercise) 130/70    Blood Pressure (Exit) 100/62    Heart Rate (Admit) 66 bpm    Heart Rate (Exercise) 88 bpm    Heart Rate (Exit) 69 bpm    Oxygen Saturation (Admit) 94 %    Oxygen Saturation (Exercise) 93 %    Oxygen Saturation (Exit) 94 %    Rating of Perceived Exertion (Exercise) 10    Perceived Dyspnea (Exercise) 1    Symptoms Left Hip pain radiating down leg while walking    Duration Continue with 30 min of aerobic exercise without signs/symptoms of physical distress.    Intensity THRR unchanged      Progression   Progression Continue to progress workloads to maintain intensity without signs/symptoms of physical  distress.    Average METs 3      Resistance Training   Training Prescription Yes    Weight Blue bands    Reps 10-15    Time 10 Minutes      Treadmill   MPH 2.5    Grade 2    Minutes 15      NuStep   Level 7    SPM 80    Minutes 15    METs 3           Social History   Tobacco Use  Smoking Status Former Smoker  . Packs/day: 1.00  . Years: 38.00  . Pack years: 38.00  . Quit date: 03/14/2009  . Years since quitting: 11.0  Smokeless Tobacco Never Used    Goals Met:  Proper associated with RPD/PD & O2 Sat Independence with exercise equipment Exercise tolerated well Strength training completed today  Goals Unmet:  Not Applicable  Comments: Service time is from 1015 to 1113 Pt had to stop exercise on the treadmill due to left hip pain. She stated it was shooting down her leg. She was only able to complete 7 minutes of walking.    Dr. Fransico Him is Medical Director for Cardiac Rehab at [facility].

## 2020-03-14 ENCOUNTER — Other Ambulatory Visit: Payer: Self-pay

## 2020-03-14 ENCOUNTER — Encounter (HOSPITAL_COMMUNITY)
Admission: RE | Admit: 2020-03-14 | Discharge: 2020-03-14 | Disposition: A | Discharge status: Home / Self Care | Payer: PPO | Source: Ambulatory Visit | Attending: Emergency Medicine | Admitting: Emergency Medicine

## 2020-03-14 DIAGNOSIS — J449 Chronic obstructive pulmonary disease, unspecified: Secondary | ICD-10-CM | POA: Diagnosis not present

## 2020-03-14 NOTE — Progress Notes (Signed)
Nutrition Note Follow Up  Spoke with pt during exercise. She lost 8.6 lbs during rehab. She reports exercising on treadmill at home and continuing to make healthy food choices. She states plans to work with a Physiological scientist upon leaving pulmonary rehab. She has more energy and has the tools to continue making healthy lifestyle choices.  She has no questions and verbalizes understanding.  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  Benefits of adopting healthy diet reviewed with Rate My Plate survey  Continue client-centered nutrition education by RD, as part of interdisciplinary care.  Goal(s)  Pt to identify food quantities necessary to achieve weight loss of 6-24 lb at graduation frompulmonaryrehab.   Pt to build a healthy plate including vegetables, fruits, whole grains, and low-fat dairy products ina generallyhealthy meal plan.   Plan:  Will provide client-centered nutrition education as part of interdisciplinary care  Monitor and evaluate progress toward nutrition goal with team.  Michaele Offer, MS, RDN, LDN

## 2020-03-14 NOTE — Progress Notes (Signed)
Daily Session Note  Patient Details  Name: Annette [name] MRN: [id number] Date of Birth: 11/01/1950 Referring Provider:     Pulmonary Rehab Walk Test from 01/08/2020 in [place]  Referring Provider Dr. Lamonte Sakai      Encounter Date: 03/14/2020  Check In:  Session Check In - 03/14/20 1119      Check-In   Supervising physician immediately available to respond to emergencies [facility] immediately available    Physician(s) Dr. Wyline Copas    [facility]    Staff Present Maurice Small, RN, Bjorn Loser, MS, CEP, Exercise Physiologist;Tawnee Clegg Ysidro Evert, RN;David Jaguas, MS, EP-C, CCRP    Virtual Visit No    Medication changes reported     No    Fall or balance concerns reported    No    Tobacco Cessation No Change    Warm-up and Cool-down Performed as group-led instruction    Resistance Training Performed Yes    VAD Patient? No    PAD/SET Patient? No      Pain Assessment   Currently in Pain? No/denies    Multiple Pain Sites No           Capillary Blood Glucose: No results found for this or any previous visit (from the past 24 hour(s)).    Social History   Tobacco Use  Smoking Status Former Smoker  . Packs/day: 1.00  . Years: 38.00  . Pack years: 38.00  . Quit date: 03/14/2009  . Years since quitting: 11.0  Smokeless Tobacco Never Used    Goals Met:  Exercise tolerated well No report of cardiac concerns or symptoms Strength training completed today  Goals Unmet:  Not Applicable  Comments: Service time is from 1020 to 1140    Dr. Fransico Him is Medical Director for Cardiac Rehab at [facility].

## 2020-04-11 DIAGNOSIS — D1801 Hemangioma of skin and subcutaneous tissue: Secondary | ICD-10-CM | POA: Diagnosis not present

## 2020-04-11 DIAGNOSIS — L820 Inflamed seborrheic keratosis: Secondary | ICD-10-CM | POA: Diagnosis not present

## 2020-04-11 DIAGNOSIS — L218 Other seborrheic dermatitis: Secondary | ICD-10-CM | POA: Diagnosis not present

## 2020-04-11 DIAGNOSIS — L821 Other seborrheic keratosis: Secondary | ICD-10-CM | POA: Diagnosis not present

## 2020-04-11 DIAGNOSIS — L814 Other melanin hyperpigmentation: Secondary | ICD-10-CM | POA: Diagnosis not present

## 2020-04-11 DIAGNOSIS — D225 Melanocytic nevi of trunk: Secondary | ICD-10-CM | POA: Diagnosis not present

## 2020-04-29 ENCOUNTER — Telehealth: Payer: Self-pay | Admitting: Acute Care

## 2020-04-29 ENCOUNTER — Other Ambulatory Visit: Payer: Self-pay | Admitting: Cardiology

## 2020-04-29 NOTE — Telephone Encounter (Signed)
I have spoke with Annette [name] and her LCS CT has been scheduled on 05/13/20 @ 10:30am at [place]

## 2020-04-29 NOTE — Telephone Encounter (Signed)
LVM for patient to return call to schedule the LCS CT my # [phone]

## 2020-04-29 NOTE — Telephone Encounter (Signed)
Annette [name], can you contact pt to get ct scheduled?

## 2020-05-06 NOTE — Progress Notes (Signed)
Discharge Progress Report  Patient Details  Name: Annette [name] MRN: [id number] Date of Birth: 17-Feb-1951 Referring Provider:     Pulmonary Rehab Walk Test from 01/08/2020 in [place]  Referring Provider Dr. Lamonte Sakai       Number of Visits: 17  Reason for Discharge:  Patient reached a stable level of exercise. Patient independent in their exercise. Patient has met program and personal goals.  Smoking History:  Social History   Tobacco Use  Smoking Status Former Smoker  . Packs/day: 1.00  . Years: 38.00  . Pack years: 38.00  . Quit date: 03/14/2009  . Years since quitting: 11.1  Smokeless Tobacco Never Used    Diagnosis:  Chronic obstructive pulmonary disease, unspecified COPD type ([place])  ADL [facility]:  Pulmonary Assessment Scores    Row Name 01/08/20 [phone] 01/08/20 1021 03/14/20 1204     ADL [facility]   ADL Phase Entry Entry Exit   SOB Score total 31 -- 13     CAT Score   CAT Score 15 -- 21     mMRC Score   mMRC Score -- 2 1          Initial Exercise Prescription:  Initial Exercise Prescription - 01/08/20 1000      Date of Initial Exercise RX and Referring Provider   Date 01/08/20    Referring Provider Dr. Lamonte Sakai      Treadmill   MPH 2.1    Grade 1    Minutes 15      NuStep   Level 3    SPM 80    Minutes 15      Prescription Details   Frequency (times per week) 2    Duration Progress to 30 minutes of continuous aerobic without signs/symptoms of physical distress      Intensity   THRR 40-80% of Max Heartrate 60-120    Ratings of Perceived Exertion 11-13    Perceived Dyspnea 0-4      Progression   Progression Continue to progress workloads to maintain intensity without signs/symptoms of physical distress.      Resistance Training   Training Prescription Yes    Weight orange bands    Reps 10-15           Discharge Exercise Prescription (Final Exercise Prescription Changes):  Exercise Prescription Changes -  03/12/20 1100      Response to Exercise   Blood Pressure (Admit) 102/72    Blood Pressure (Exercise) 130/70    Blood Pressure (Exit) 100/62    Heart Rate (Admit) 66 bpm    Heart Rate (Exercise) 88 bpm    Heart Rate (Exit) 69 bpm    Oxygen Saturation (Admit) 94 %    Oxygen Saturation (Exercise) 93 %    Oxygen Saturation (Exit) 94 %    Rating of Perceived Exertion (Exercise) 10    Perceived Dyspnea (Exercise) 1    Symptoms Left Hip pain radiating down leg while walking    Duration Continue with 30 min of aerobic exercise without signs/symptoms of physical distress.    Intensity THRR unchanged      Progression   Progression Continue to progress workloads to maintain intensity without signs/symptoms of physical distress.    Average METs 3      Resistance Training   Training Prescription Yes    Weight Blue bands    Reps 10-15    Time 10 Minutes      Treadmill   MPH 2.5  Grade 2    Minutes 15      NuStep   Level 7    SPM 80    Minutes 15    METs 3           Functional Capacity:  6 Minute Walk    Row Name 01/08/20 1021 03/14/20 1205       6 Minute Walk   Phase Initial Discharge    Distance 1380 feet 1500 feet    Distance % Change -- 8.7 %    Distance Feet Change -- 120 ft    Walk Time 6 minutes 6 minutes    # of Rest Breaks 0 0    MPH 2.61 2.84    METS 2.54 2.87    RPE 11 7    Perceived Dyspnea  1 0    VO2 Peak 8.9 10.03    Symptoms No No    Resting HR 56 bpm 67 bpm    Resting BP 124/80 116/64    Resting Oxygen Saturation  93 % 94 %    Exercise Oxygen Saturation  during 6 min walk 89 % 90 %    Max Ex. HR 96 bpm 96 bpm    Max Ex. BP 134/68 132/78    2 Minute Post BP 114/72 108/66      Interval HR   1 Minute HR 89 85    2 Minute HR 95 79    3 Minute HR 94 95    4 Minute HR 96 100    5 Minute HR 94 102    6 Minute HR 93 99    2 Minute Post HR 63 65    Interval Heart Rate? Yes Yes      Interval Oxygen   Interval Oxygen? Yes Yes    Baseline  Oxygen Saturation % 93 % 94 %    1 Minute Oxygen Saturation % 94 % 92 %    1 Minute Liters of Oxygen 0 L 0 L    2 Minute Oxygen Saturation % 94 % 90 %    2 Minute Liters of Oxygen 0 L 0 L    3 Minute Oxygen Saturation % 94 % 93 %    3 Minute Liters of Oxygen 0 L 0 L    4 Minute Oxygen Saturation % 89 % 93 %    4 Minute Liters of Oxygen 0 L 0 L    5 Minute Oxygen Saturation % 89 % 92 %    5 Minute Liters of Oxygen 0 L 0 L    6 Minute Oxygen Saturation % 94 % 92 %    6 Minute Liters of Oxygen 0 L 0 L    2 Minute Post Oxygen Saturation % 96 % 96 %    2 Minute Post Liters of Oxygen 0 L 0 L           Psychological, QOL, Others - Outcomes: PHQ 2/9: Depression screen [facility] 2/9 03/14/2020 01/08/2020  Decreased Interest 0 0  Down, Depressed, Hopeless 0 0  PHQ - 2 Score 0 0  Altered sleeping 3 3  Tired, decreased energy 0 0  Change in appetite 1 0  Feeling bad or failure about yourself  0 0  Trouble concentrating 0 0  Moving slowly or fidgety/restless 0 0  Suicidal thoughts 0 0  PHQ-9 Score 4 -  Difficult doing work/chores Not difficult at all Not difficult at all    Quality of Life:   Personal  Goals: Goals established at orientation with interventions provided to work toward goal.  Personal Goals and Risk Factors at Admission - 01/08/20 0954      Core Components/Risk Factors/Patient Goals on Admission   Improve shortness of breath with ADL's Yes    Intervention Provide education, individualized exercise plan and daily activity instruction to help decrease symptoms of SOB with activities of daily living.    Expected Outcomes Short Term: Improve cardiorespiratory fitness to achieve a reduction of symptoms when performing ADLs;Long Term: Be able to perform more ADLs without symptoms or delay the onset of symptoms            Personal Goals Discharge:  Goals and Risk Factor Review    Row Name 01/08/20 0954 01/29/20 1036 01/29/20 1153 02/26/20 1340 05/06/20 1433     Core  Components/Risk Factors/Patient Goals Review   Personal Goals Review Increase knowledge of respiratory medications and ability to use respiratory devices properly.;Improve shortness of breath with ADL's;Develop more efficient breathing techniques such as purse lipped breathing and diaphragmatic breathing and practicing self-pacing with activity. Increase knowledge of respiratory medications and ability to use respiratory devices properly.;Improve shortness of breath with ADL's;Develop more efficient breathing techniques such as purse lipped breathing and diaphragmatic breathing and practicing self-pacing with activity. (P)  Increase knowledge of respiratory medications and ability to use respiratory devices properly.;Improve shortness of breath with ADL's;Develop more efficient breathing techniques such as purse lipped breathing and diaphragmatic breathing and practicing self-pacing with activity. Improve shortness of breath with ADL's;Develop more efficient breathing techniques such as purse lipped breathing and diaphragmatic breathing and practicing self-pacing with activity.;Increase knowledge of respiratory medications and ability to use respiratory devices properly. Improve shortness of breath with ADL's;Develop more efficient breathing techniques such as purse lipped breathing and diaphragmatic breathing and practicing self-pacing with activity.;Increase knowledge of respiratory medications and ability to use respiratory devices properly.   Review -- -- Yomara just started the program, has attended 3 exercise sessions, too early to have met any program goals. Laurianne has increased her workloads to level 6 on the nustep @ 3.0 mets, and is walking on the treadmill @ 2.4 mph and 2% incline.  She has lost 1 kg. Talesha completed 17 exercise sessions and graduated on 03/14/20.  Pt showed improvement in distance to 1500 during 6 minute walk test to 1380. Pt completed pre and post pulmonary assessment questionaires    Expected Outcomes -- -- See admission goals. See admission goals. --   [place] Name 05/06/20 1437             Core Components/Risk Factors/Patient Goals Review   Review Blessyn completed 17 exercise sessions and graduated on 03/14/20.  Pt showed improvement in distance to 1500 during 6 minute walk test to 1380. Pt completed pre and post pulmonary assessment questionaires.  On the CAT assessment she showed increase from 15 to 21.  this may be attributed to increased self awareness of her shortness ob breath and now has a better understanding on how to quatify the numbers based upon the activity. However she showed a decrease in the dypsnea scale from 31 to 13.  Increase in knowledge test with 16/18 to 17/18 correct.       Expected Outcomes Alaira met her admission goals              Exercise Goals and Review:  Exercise Goals    Row Name 01/08/20 1027 01/30/20 1448 02/27/20 0708         Exercise  Goals   Increase Physical Activity Yes Yes Yes     Intervention Provide advice, education, support and counseling about physical activity/exercise needs.;Develop an individualized exercise prescription for aerobic and resistive training based on initial evaluation findings, risk stratification, comorbidities and participant's personal goals. Provide advice, education, support and counseling about physical activity/exercise needs.;Develop an individualized exercise prescription for aerobic and resistive training based on initial evaluation findings, risk stratification, comorbidities and participant's personal goals. Provide advice, education, support and counseling about physical activity/exercise needs.;Develop an individualized exercise prescription for aerobic and resistive training based on initial evaluation findings, risk stratification, comorbidities and participant's personal goals.     Expected Outcomes Short Term: Attend rehab on a regular basis to increase amount of physical activity.;Long Term: Add  in home exercise to make exercise part of routine and to increase amount of physical activity.;Long Term: Exercising regularly at least 3-5 days a week. Short Term: Attend rehab on a regular basis to increase amount of physical activity.;Long Term: Add in home exercise to make exercise part of routine and to increase amount of physical activity.;Long Term: Exercising regularly at least 3-5 days a week. Short Term: Attend rehab on a regular basis to increase amount of physical activity.;Long Term: Add in home exercise to make exercise part of routine and to increase amount of physical activity.;Long Term: Exercising regularly at least 3-5 days a week.     Increase Strength and Stamina Yes Yes Yes     Intervention Provide advice, education, support and counseling about physical activity/exercise needs.;Develop an individualized exercise prescription for aerobic and resistive training based on initial evaluation findings, risk stratification, comorbidities and participant's personal goals. Provide advice, education, support and counseling about physical activity/exercise needs.;Develop an individualized exercise prescription for aerobic and resistive training based on initial evaluation findings, risk stratification, comorbidities and participant's personal goals. Provide advice, education, support and counseling about physical activity/exercise needs.;Develop an individualized exercise prescription for aerobic and resistive training based on initial evaluation findings, risk stratification, comorbidities and participant's personal goals.     Expected Outcomes Short Term: Increase workloads from initial exercise prescription for resistance, speed, and METs.;Short Term: Perform resistance training exercises routinely during rehab and add in resistance training at home;Long Term: Improve cardiorespiratory fitness, muscular endurance and strength as measured by increased METs and functional capacity (6MWT) Short Term:  Increase workloads from initial exercise prescription for resistance, speed, and METs.;Short Term: Perform resistance training exercises routinely during rehab and add in resistance training at home;Long Term: Improve cardiorespiratory fitness, muscular endurance and strength as measured by increased METs and functional capacity (6MWT) Short Term: Increase workloads from initial exercise prescription for resistance, speed, and METs.;Short Term: Perform resistance training exercises routinely during rehab and add in resistance training at home;Long Term: Improve cardiorespiratory fitness, muscular endurance and strength as measured by increased METs and functional capacity (6MWT)     Able to understand and use rate of perceived exertion (RPE) scale Yes Yes Yes     Intervention Provide education and explanation on how to use RPE scale Provide education and explanation on how to use RPE scale Provide education and explanation on how to use RPE scale     Expected Outcomes Short Term: Able to use RPE daily in rehab to express subjective intensity level;Long Term:  Able to use RPE to guide intensity level when exercising independently Short Term: Able to use RPE daily in rehab to express subjective intensity level;Long Term:  Able to use RPE to guide intensity level when exercising  independently Short Term: Able to use RPE daily in rehab to express subjective intensity level;Long Term:  Able to use RPE to guide intensity level when exercising independently     Able to understand and use Dyspnea scale Yes Yes Yes     Intervention Provide education and explanation on how to use Dyspnea scale Provide education and explanation on how to use Dyspnea scale Provide education and explanation on how to use Dyspnea scale     Expected Outcomes Short Term: Able to use Dyspnea scale daily in rehab to express subjective sense of shortness of breath during exertion;Long Term: Able to use Dyspnea scale to guide intensity level when  exercising independently Short Term: Able to use Dyspnea scale daily in rehab to express subjective sense of shortness of breath during exertion;Long Term: Able to use Dyspnea scale to guide intensity level when exercising independently Short Term: Able to use Dyspnea scale daily in rehab to express subjective sense of shortness of breath during exertion;Long Term: Able to use Dyspnea scale to guide intensity level when exercising independently     Knowledge and understanding of Target Heart Rate Range (THRR) Yes Yes Yes     Intervention Provide education and explanation of THRR including how the numbers were predicted and where they are located for reference Provide education and explanation of THRR including how the numbers were predicted and where they are located for reference Provide education and explanation of THRR including how the numbers were predicted and where they are located for reference     Expected Outcomes Short Term: Able to state/look up THRR;Short Term: Able to use daily as guideline for intensity in rehab;Long Term: Able to use THRR to govern intensity when exercising independently Short Term: Able to state/look up THRR;Short Term: Able to use daily as guideline for intensity in rehab;Long Term: Able to use THRR to govern intensity when exercising independently Short Term: Able to state/look up THRR;Short Term: Able to use daily as guideline for intensity in rehab;Long Term: Able to use THRR to govern intensity when exercising independently     Understanding of Exercise Prescription Yes Yes Yes     Intervention Provide education, explanation, and written materials on patient's individual exercise prescription Provide education, explanation, and written materials on patient's individual exercise prescription Provide education, explanation, and written materials on patient's individual exercise prescription     Expected Outcomes Short Term: Able to explain program exercise prescription;Long  Term: Able to explain home exercise prescription to exercise independently Short Term: Able to explain program exercise prescription;Long Term: Able to explain home exercise prescription to exercise independently Short Term: Able to explain program exercise prescription;Long Term: Able to explain home exercise prescription to exercise independently            Exercise Goals Re-Evaluation:  Exercise Goals Re-Evaluation    Row Name 01/30/20 1448 02/27/20 0709           Exercise Goal Re-Evaluation   Exercise Goals Review Increase Physical Activity;Increase Strength and Stamina;Able to understand and use rate of perceived exertion (RPE) scale;Able to understand and use Dyspnea scale;Knowledge and understanding of Target Heart Rate Range (THRR);Understanding of Exercise Prescription Increase Physical Activity;Increase Strength and Stamina;Able to understand and use rate of perceived exertion (RPE) scale;Able to understand and use Dyspnea scale;Knowledge and understanding of Target Heart Rate Range (THRR);Understanding of Exercise Prescription      Comments Pt has completed 4 exercise sessions. She has increased her workloads every session. She has a positive attitude and  is motivated to lose weight and increase stamina. She currently exercises at 3.24 METs on the treadmill and 2.6 on the stepper. Will continue to monitor and progress as able. Pt has completed 11 exercise sessions. She continues to increase her workloads. She currently exercises at 3.5 METs on the treadmill and 3.0 METs on the stepper. Will continue to monitor and progress as able.      Expected Outcomes Through exercise at rehab and at home, the patient will decrease shortness of breath with daily activities and feel confident in carrying out an exercise regime at home. Through exercise at rehab and at home, the patient will decrease shortness of breath with daily activities and feel confident in carrying out an exercise regime at home.              Nutrition & Weight - Outcomes:    Nutrition:  Nutrition Therapy & Goals - 01/18/20 1141      Nutrition Therapy   Diet Generally healthy      Personal Nutrition Goals   Nutrition Goal Pt to identify food quantities necessary to achieve weight loss of 6-24 lb at graduation from pulmonary rehab    Personal Goal #2 Pt to build a healthy plate including vegetables, fruits, whole grains, and low-fat dairy products in a generally healthy meal plan.      Intervention Plan   Intervention Prescribe, educate and counsel regarding individualized specific dietary modifications aiming towards targeted core components such as weight, hypertension, lipid management, diabetes, heart failure and other comorbidities.    Expected Outcomes Short Term Goal: A plan has been developed with personal nutrition goals set during dietitian appointment.           Nutrition Discharge:  Nutrition Assessments - 03/21/20 1201      Rate Your Plate Scores   Post Score 67           Education Questionnaire Score:  Knowledge Questionnaire Score - 03/14/20 1204      Knowledge Questionnaire Score   Post Score 17/18           Goals reviewed with patient Maurice Small RN, BSN Cardiac and Pulmonary Rehab Nurse Navigator

## 2020-05-06 NOTE — Addendum Note (Signed)
Encounter addended by: Rowe Pavy, RN on: 05/06/2020 2:51 PM  Actions taken: Flowsheet data copied forward, Flowsheet accepted, Clinical Note Signed

## 2020-05-06 NOTE — Addendum Note (Signed)
Encounter addended by: Rowe Pavy, RN on: 05/06/2020 2:37 PM  Actions taken: Episode resolved, Flowsheet data copied forward, Flowsheet accepted

## 2020-05-10 DIAGNOSIS — H25012 Cortical age-related cataract, left eye: Secondary | ICD-10-CM | POA: Diagnosis not present

## 2020-05-10 DIAGNOSIS — H2512 Age-related nuclear cataract, left eye: Secondary | ICD-10-CM | POA: Diagnosis not present

## 2020-05-10 DIAGNOSIS — H04123 Dry eye syndrome of bilateral lacrimal glands: Secondary | ICD-10-CM | POA: Diagnosis not present

## 2020-05-13 ENCOUNTER — Other Ambulatory Visit: Payer: Self-pay

## 2020-05-13 ENCOUNTER — Ambulatory Visit (INDEPENDENT_AMBULATORY_CARE_PROVIDER_SITE_OTHER)
Admission: RE | Admit: 2020-05-13 | Discharge: 2020-05-13 | Disposition: A | Discharge status: Home / Self Care | Payer: PPO | Source: Ambulatory Visit | Attending: Acute Care | Admitting: Acute Care

## 2020-05-13 DIAGNOSIS — Z87891 Personal history of nicotine dependence: Secondary | ICD-10-CM | POA: Diagnosis not present

## 2020-05-13 DIAGNOSIS — Z122 Encounter for screening for malignant neoplasm of respiratory organs: Secondary | ICD-10-CM

## 2020-05-14 NOTE — Progress Notes (Signed)
Please call patient and let them  know their  low dose Ct was read as a Lung RADS 2: nodules that are benign in appearance and behavior with a very low likelihood of becoming a clinically active cancer due to size or lack of growth. Recommendation per radiology is for a repeat LDCT in 12 months. .Please let them  know we will order and schedule their  annual screening scan for 04/2021. Please let them  know there was notation of CAD on their  scan.  Please remind the patient  that this is a non-gated exam therefore degree or severity of disease  cannot be determined. Please have them  follow up with their PCP regarding potential risk factor modification, dietary therapy or pharmacologic therapy if clinically indicated. Pt.  is  currently on statin therapy. Please place order for annual  screening scan for  04/2021 and fax results to PCP. Thanks so much. 

## 2020-05-15 ENCOUNTER — Other Ambulatory Visit: Payer: Self-pay | Admitting: *Deleted

## 2020-05-15 DIAGNOSIS — Z87891 Personal history of nicotine dependence: Secondary | ICD-10-CM

## 2020-05-18 DIAGNOSIS — Z03818 Encounter for observation for suspected exposure to other biological agents ruled out: Secondary | ICD-10-CM | POA: Diagnosis not present

## 2020-05-18 DIAGNOSIS — J22 Unspecified acute lower respiratory infection: Secondary | ICD-10-CM | POA: Diagnosis not present

## 2020-05-18 DIAGNOSIS — J449 Chronic obstructive pulmonary disease, unspecified: Secondary | ICD-10-CM | POA: Diagnosis not present

## 2020-05-27 ENCOUNTER — Ambulatory Visit: Payer: PPO | Admitting: Emergency Medicine

## 2020-06-10 DIAGNOSIS — H524 Presbyopia: Secondary | ICD-10-CM | POA: Diagnosis not present

## 2020-06-10 DIAGNOSIS — H25012 Cortical age-related cataract, left eye: Secondary | ICD-10-CM | POA: Diagnosis not present

## 2020-06-10 DIAGNOSIS — H35013 Changes in retinal vascular appearance, bilateral: Secondary | ICD-10-CM | POA: Diagnosis not present

## 2020-06-10 DIAGNOSIS — H04123 Dry eye syndrome of bilateral lacrimal glands: Secondary | ICD-10-CM | POA: Diagnosis not present

## 2020-06-10 DIAGNOSIS — H35033 Hypertensive retinopathy, bilateral: Secondary | ICD-10-CM | POA: Diagnosis not present

## 2020-06-10 DIAGNOSIS — H2512 Age-related nuclear cataract, left eye: Secondary | ICD-10-CM | POA: Diagnosis not present

## 2020-06-18 ENCOUNTER — Ambulatory Visit: Payer: PPO | Admitting: Emergency Medicine

## 2020-06-24 DIAGNOSIS — H2512 Age-related nuclear cataract, left eye: Secondary | ICD-10-CM | POA: Diagnosis not present

## 2020-06-24 DIAGNOSIS — H35013 Changes in retinal vascular appearance, bilateral: Secondary | ICD-10-CM | POA: Diagnosis not present

## 2020-06-24 DIAGNOSIS — H25012 Cortical age-related cataract, left eye: Secondary | ICD-10-CM | POA: Diagnosis not present

## 2020-07-05 ENCOUNTER — Ambulatory Visit: Payer: PPO | Admitting: Cardiology

## 2020-07-08 ENCOUNTER — Telehealth: Payer: Self-pay | Admitting: Emergency Medicine

## 2020-07-08 NOTE — Telephone Encounter (Signed)
Spoke with the pt  She states that she is constantly having issues getting her serevent refilled  She states usually the pharmacy is out and has to special order for her  This month she has tried 5 different pharmacies and no one can get the medication  She is asking if we can switch her to something else  She was overdue for f/u (had to cancel last ov for emergency) so I scheduled her with Judson Roch for 07/29/20 Please advise on inhaler, thanks!

## 2020-07-10 NOTE — Telephone Encounter (Signed)
She didn't tolerate Stiolto, unsure whether this was the LAMA component, but we left her on LABA alone (serevent). The closest substitute would be Striverdi Respimat. We can try that, or she can discuss starting an alternative (maybe another LABA/LAMA, or possibly a LABA/ICS) when she sees Judson Roch

## 2020-07-12 MED ORDER — STRIVERDI RESPIMAT 2.5 MCG/ACT IN AERS: 2.0000 | INHALATION_SPRAY | Freq: Every day | RESPIRATORY_TRACT | 5 refills | Status: DC

## 2020-07-12 NOTE — Telephone Encounter (Signed)
Called and spoke with pt letting her know the info stated by RB and she verbalized understanding stating that she wants Rx for Striverdi Respimat inhaler to go ahead and be sent to pharmacy for her. Verified preferred pharmacy and sent Rx in for pt. Nothing further needed.

## 2020-07-15 ENCOUNTER — Telehealth: Payer: Self-pay | Admitting: Emergency Medicine

## 2020-07-15 NOTE — Telephone Encounter (Signed)
Called and spoke with pt who stated her insurance will not cover Striverdi Respimat. Covered alternatives are Trelegy and Anoro.  Dr. Lamonte Sakai, please advise on this for pt.

## 2020-07-15 NOTE — Telephone Encounter (Signed)
Patient states pharmacy needs RX by 5:00 pm. Patient phone number is [phone].

## 2020-07-16 DIAGNOSIS — H25012 Cortical age-related cataract, left eye: Secondary | ICD-10-CM | POA: Diagnosis not present

## 2020-07-16 DIAGNOSIS — H2512 Age-related nuclear cataract, left eye: Secondary | ICD-10-CM | POA: Diagnosis not present

## 2020-07-16 DIAGNOSIS — H25812 Combined forms of age-related cataract, left eye: Secondary | ICD-10-CM | POA: Diagnosis not present

## 2020-07-16 MED ORDER — ANORO ELLIPTA 62.5-25 MCG/INH IN AEPB: 1.0000 | INHALATION_SPRAY | Freq: Every day | RESPIRATORY_TRACT | 11 refills | Status: DC

## 2020-07-16 NOTE — Telephone Encounter (Signed)
This is not an exact substitution, but would try Anoro and see if she benefits, tolerates.

## 2020-07-16 NOTE — Telephone Encounter (Signed)
Spoke with the pt and made her aware of response per RB. She verbalized understanding. She wants to start with a 30 day supply to be sure she tolerates this before we send a 90 day. I have sent the rx in. Nothing further needed.

## 2020-07-16 NOTE — Telephone Encounter (Signed)
Please let her know that Anoro is similar to [employer] (which gave her side effects) so she will need to watch for similar effects > edema, urinary hesitancy, etc.

## 2020-07-29 ENCOUNTER — Ambulatory Visit: Payer: PPO | Admitting: Acute Care

## 2020-07-29 ENCOUNTER — Encounter: Payer: Self-pay | Admitting: Acute Care

## 2020-07-29 ENCOUNTER — Other Ambulatory Visit: Payer: Self-pay

## 2020-07-29 VITALS — BP 120/74 | HR 67 | Temp 98.1°F | Ht 67.0 in | Wt 224.8 lb

## 2020-07-29 DIAGNOSIS — J309 Allergic rhinitis, unspecified: Secondary | ICD-10-CM

## 2020-07-29 DIAGNOSIS — Z122 Encounter for screening for malignant neoplasm of respiratory organs: Secondary | ICD-10-CM | POA: Diagnosis not present

## 2020-07-29 DIAGNOSIS — J449 Chronic obstructive pulmonary disease, unspecified: Secondary | ICD-10-CM

## 2020-07-29 MED ORDER — BUDESONIDE-FORMOTEROL FUMARATE 80-4.5 MCG/ACT IN AERO: 2.0000 | INHALATION_SPRAY | Freq: Two times a day (BID) | RESPIRATORY_TRACT | 12 refills | Status: DC

## 2020-07-29 NOTE — Progress Notes (Signed)
History of Present Illness Annette [name] is a 69 y.o. female former smoker ( 38 pack year smoking history, quit 2010) with COPD a history of CAD.She is followed by Dr. Lamonte Sakai  HPI 69 year old former smoker (~40 pack years) with a history of coronary artery disease, hypertension, hypothyroidism.    She began to notice some exertional SOB in 2016, has slowly worsened. Has been occasionally been assoc with edema, CP. She used to be able to walk 2-3 miles a day, but as recently as July she could not walk a block. She has gained about 50 lbs over 5 years. She has a lot of non-productive cough, hears some exp wheeze. Sometimes has chest tightness. Reassuring cards eval by Dr Ellyn Hack. Occasional GERD sx when she overeats. Occasional environmental allergies.   She is not currently on any bronchodilators regimen, as she has been unable to get it from her drugstore.  (Serevent) PFT's 10/10/2019 shows significant obstruction with probable superimposed restriction FEV1 58% predicted, pseudonormalization of her lung volumes and normal diffusion capacity. She does not tolerate anticholinergics.     07/29/2020  Pt. Presents today for follow up of her exertional ddyspnea. She states she has had a stable interval.She is not currently on any maintenance medication. She has been unable to get her Serevent maintenance medication.. Her drug store has not been able to supply this for her. She has been using her rescue inhaler about 4 times daily since she has been out of her Serevent. She has occasional wheezing which is resolved with her rescue inhaler use. When on her Serevent she rarely needs her rescue inhaler. She states her secretions are stable and do not have color. She has been vaccinated and boosted. She is frustrated that her drug store cannot supply her maintenance medication. We discussed using an ICS/ LABA instead. She is willing to try this to avoid the anticholinergics that she has multiple issues  tolerating. She has had a very stressful interval. Her 51 yr old son has been recently separated from his wife, lost his job due to [employer], and has been suicidal. She has been caring for him at home  for several months. He is now back at home in [facility], doing better. She needs to now focus on her own health. She states that she benefited from Pulmonary Rehab and she would like to repeat this if her insurance will pay, as she has noticed that she is more deconditioned after caring for her son, as she has not been able to follow her exercise regimen. She does have a treadmill, and she realizes how much the exercises she did at pulmonary rehab have helped her. We discussed using a mail order pharmacy who may have better access to her Serevent , so she does not have to go without the her maintenance inhaler.   Test Results:      Lung Cancer Screening  quit 11 years ago, with 27 pack-year history of smoking  No evidence of thoracic aortic aneurysm. Atherosclerotic calcifications of the aortic arch.  Mild three-vessel coronary atherosclerosis.  Mediastinum/Nodes: No suspicious mediastinal lymphadenopathy.  Lungs/Pleura: Mild centrilobular emphysematous changes, upper lung predominant.  No focal consolidation.  Scattered small pulmonary nodules, measuring up to 3.7 mm in the left lower, grossly unchanged.  No pleural effusion or pneumothorax.  Upper Abdomen: Visualized upper abdomen is notable for prior cholecystectomy and vascular calcifications.  Musculoskeletal: Mild degenerative changes of the visualized thoracolumbar spine.  IMPRESSION: Lung-RADS 2, benign appearance or behavior. Continue  annual screening with low-dose chest CT without contrast in 12 months.  Aortic Atherosclerosis (ICD10-I70.0) and Emphysema (ICD10-J43.9).  CBC Latest Ref Rng & Units 03/23/2015 03/15/2015 06/01/2013  WBC 4.0 - 10.5 K/uL 11.6(H) 7.4 6.3  Hemoglobin 12.0 - 15.0 g/dL 11.2(L)  14.2 13.8  Hematocrit 36.0 - 46.0 % 33.1(L) 42.4 40.6  Platelets 150 - 400 K/uL 228 332 293.0    BMP Latest Ref Rng & Units 05/31/2019 01/28/2018 01/18/2018  Glucose 65 - 99 mg/dL 98 96 87  BUN 8 - 27 mg/dL 19 20 16   Creatinine 0.57 - 1.00 mg/dL 0.81 0.81 0.88  BUN/Creat Ratio 12 - 28 23 25 18   Sodium 134 - 144 mmol/L 140 139 139  Potassium 3.5 - 5.2 mmol/L 4.6 5.2 5.5(H)  Chloride 96 - 106 mmol/L 95(L) 96 97  CO2 20 - 29 mmol/L 30(H) 28 27  Calcium 8.7 - 10.3 mg/dL 9.8 9.9 9.8    BNP    Component Value Date/Time   BNP 17.4 05/31/2019 1402    ProBNP No results found for: PROBNP  PFT    Component Value Date/Time   FEV1PRE 1.51 10/10/2019 1622   FEV1POST 1.46 10/10/2019 1622   FVCPRE 2.27 10/10/2019 1622   FVCPOST 2.20 10/10/2019 1622   TLC 5.05 10/10/2019 1622   DLCOUNC 18.30 10/10/2019 1622   PREFEV1FVCRT 66 10/10/2019 1622   PSTFEV1FVCRT 67 10/10/2019 1622    No results found.   Past medical hx Past Medical History:  Diagnosis Date  . Arthritis    Right hip, end stage  . Coronary artery calcification seen on computed tomography 02/2018   Coronary calcium score 79.  Coronary CT angiogram: Mild CAD and proximal LAD, proximal RCA and mid LCx.  Moderate plaque ostial LCx.  Mildly dilated pulmonary artery, suggestive of possible pulmonary hypertension  . Difficulty sleeping    DUE TO PAIN  . Emphysema of lung ([place])   . Fluid retention    TAKES HCTZ  . Hemorrhoids   . Hyperlipidemia   . Hypertension   . Hypothyroidism   . Irritable bowel syndrome   . Shingles 2004  . Tremors of nervous system    TAKES PROPRANOLOL TO TX  . Varicose veins      Social History   Tobacco Use  . Smoking status: Former Smoker    Packs/day: 1.00    Years: 38.00    Pack years: 38.00    Quit date: 03/14/2009    Years since quitting: 11.3  . Smokeless tobacco: Never Used  Substance Use Topics  . Alcohol use: Yes    Comment: beer every 6-8 months  . Drug use: No    Ms.Sand  reports that she quit smoking about 11 years ago. She has a 38.00 pack-year smoking history. She has never used smokeless tobacco. She reports current alcohol use. She reports that she does not use drugs.  Tobacco Cessation: Former smoker, quit 2010 with a 38 pack year smoking history  Past surgical hx, Family hx, Social hx all reviewed.  Current Outpatient Medications on File Prior to Visit  Medication Sig  . albuterol (VENTOLIN HFA) 108 (90 Base) MCG/ACT inhaler Inhale 2 puffs into the lungs every 6 (six) hours as needed for wheezing or shortness of breath. (Patient not taking: Reported on 07/29/2020)  . ALPRAZolam (XANAX) 0.25 MG tablet TK 1 T PO QD PRN  . aspirin EC 81 MG tablet Take 81 mg by mouth daily.  . Cholecalciferol (KP VITAMIN D3) 50 MCG (2000 UT)  CAPS Take by mouth.  . hydrochlorothiazide (HYDRODIURIL) 25 MG tablet TAKE 1 TABLET(25 MG) BY MOUTH DAILY  . ketoconazole (NIZORAL) 2 % cream as needed.   Marland Kitchen levothyroxine (SYNTHROID, LEVOTHROID) 100 MCG tablet take 1 tablet by mouth every morning ON AN EMPTY STOMACH  . Multiple Vitamin (MULTIVITAMIN) tablet Take 1 tablet by mouth daily.  Marland Kitchen omeprazole (PRILOSEC) 20 MG capsule Take 20 mg by mouth every morning.  . propranolol ER (INDERAL LA) 60 MG 24 hr capsule Take 60 mg by mouth daily.  . rosuvastatin (CRESTOR) 5 MG tablet Take 5 mg by mouth daily.  Marland Kitchen umeclidinium-vilanterol (ANORO ELLIPTA) 62.5-25 MCG/INH AEPB Inhale 1 puff into the lungs daily. (Patient not taking: Reported on 07/29/2020)   No current facility-administered medications on file prior to visit.     Allergies  Allergen Reactions  . Augmentin [Amoxicillin-Pot Clavulanate] Nausea And Vomiting  . Codeine Other (See Comments)    Causes her to stay awake   . Other Hives    IV contrast   . Iodinated Diagnostic Agents Rash    Review Of Systems:  Constitutional:   No  weight loss, night sweats,  Fevers, chills, fatigue, or  lassitude.  HEENT:   No headaches,   Difficulty swallowing,  Tooth/dental problems, or  Sore throat,                No sneezing, itching, ear ache, nasal congestion,+  post nasal drip,   CV:  No chest pain,  Orthopnea, PND, swelling in lower extremities, anasarca, dizziness, palpitations, syncope.   GI  No heartburn, indigestion, abdominal pain, nausea, vomiting, diarrhea, change in bowel habits, loss of appetite, bloody stools.   Resp: + shortness of breath with exertion or at rest.  Baseline  mucus, + baseline  productive cough,  No non-productive cough,  No coughing up of blood.  No change in color of mucus.  + wheezing.  No chest wall deformity  Skin: no rash or lesions.  GU: no dysuria, change in color of urine, no urgency or frequency.  No flank pain, no hematuria   MS:  No joint pain or swelling.  No decreased range of motion.  No back pain.  Psych:  No change in mood or affect. No depression or anxiety.  No memory loss.   Vital Signs BP 120/74 (BP Location: Left Arm, Cuff Size: Normal)   Pulse 67   Temp 98.1 F (36.7 C) (Oral)   Ht 5\' 7"  (1.702 m)   Wt 224 lb 12.8 oz (102 kg)   SpO2 97%   BMI 35.21 kg/m    Physical Exam:  General- No distress,  A&Ox3, pleasant ENT: No sinus tenderness, TM clear, pale nasal mucosa, no oral exudate,no post nasal drip, no LAN Cardiac: S1, S2, regular rate and rhythm, no murmur Chest: No wheeze/ rales/ dullness; no accessory muscle use, no nasal flaring, no sternal retractions Abd.: Soft Non-tender, ND, BS +, Body mass index is 35.21 kg/m. Ext: No clubbing cyanosis, edema Neuro:  normal strength, MAE x 4, A&O x 3, appropriate Skin: No rashes, No lesions or tattoos, warm and dry Psych: normal mood and behavior   Assessment/Plan Exertional Dyspnea  Former Smoker Has been unable to get Serevent Maintenance from pharmacy Using more albuterol as rescue Does not tolerate Anticholinergics Plan We will send in a prescription for Symbicort. Use 2 puffs in the morning and 2  puffs in the evening every day. Rinse mouth after use.  Continue using your rescue inhaler  as you have been doing for breakthrough shortness of breath. Continue using the Allegra for sinus issues. Consider doing this daily. Consider Mail order pharmacy through your insurance to get your Serevent.  Continue working on increasing your physical activity. Continue using Mucinex as you have been doing to help with secretion clearance. We will re-refer to pulmonary rehab as that was so beneficial. Follow up with Dr. Lamonte Sakai or Judson Roch NP in 3 months or sooner if needed.  Please contact office for sooner follow up if symptoms do not improve or worsen or seek emergency care   Allergic Rhinitis Plan Add Allegra or Zyrtec daily  Lung Cancer Screening Former smoker Quit 2010 with a 38 pack year smoking history 04/2020 LDCT>> Lung RADS 2>> Annual Screening Plan Next LDCT due 04/2021    Magdalen Spatz, NP 07/29/2020  9:29 AM

## 2020-07-29 NOTE — Patient Instructions (Signed)
It is good to see you today. We will send in a prescription for Symbicort. Use 2 puffs in the morning and 2 puffs in the evening every day. Rinse mouth after use.  Continue using your rescue inhaler as you have been doing for breakthrough shortness of breath. Continue using the Allegra for sinus issues. Consider doing this daily. Consider Mail order pharmacy through your insurance to get your Serevent.  Continue working on increasing your physical activity. Continue using Mucinex as you have been doing to help with secretion clearance. We will re-refer to pulmonary rehab as that was so beneficial. Follow up with Dr. Lamonte Sakai or Judson Roch NP in 3 months or sooner if needed.  Please contact office for sooner follow up if symptoms do not improve or worsen or seek emergency care

## 2020-08-05 DIAGNOSIS — L820 Inflamed seborrheic keratosis: Secondary | ICD-10-CM | POA: Diagnosis not present

## 2020-08-07 NOTE — Telephone Encounter (Signed)
Please advise on patient my chart message   Judson Roch, To let you know that the inhaler you prescribed, Budesonide and Formoterol Fumarate Dihydrate 80/4.5, seems to be working fine for me.  If you could send a new prescription for a 90 day supply I can save 45.00. Thanks Annette [name] July 29, 1951

## 2020-08-08 MED ORDER — BUDESONIDE-FORMOTEROL FUMARATE 80-4.5 MCG/ACT IN AERO: 2.0000 | INHALATION_SPRAY | Freq: Two times a day (BID) | RESPIRATORY_TRACT | 3 refills | Status: DC

## 2020-08-08 NOTE — Telephone Encounter (Signed)
Called and spoke with patient to let her know that Judson Roch said she was ok with sending in RX for 90 days. Patient verified preferred pharmacy. Nothing further needed at this time.

## 2020-08-23 ENCOUNTER — Encounter (HOSPITAL_COMMUNITY): Payer: Self-pay | Admitting: *Deleted

## 2020-08-23 NOTE — Progress Notes (Signed)
Received referral from Dr. Lamonte Sakai for this pt to participate in pulmonary rehab with the the diagnosis of COPD unspecified. Dr. Lamonte Sakai interprets PFT for Stage 2. Clinical review of pt follow up appt on 12/13 with Eric Form NP Pulmonary office note.  Pt with Covid Risk Score - 5 Pt appropriate for scheduling for Pulmonary rehab.  Will forward to support staff for scheduling and verification of insurance eligibility/benefits with pt consent. Cherre Huger, BSN Cardiac and Training and development officer

## 2020-08-28 DIAGNOSIS — Z1152 Encounter for screening for COVID-19: Secondary | ICD-10-CM | POA: Diagnosis not present

## 2020-08-30 ENCOUNTER — Other Ambulatory Visit: Payer: Self-pay | Admitting: Emergency Medicine

## 2020-09-06 ENCOUNTER — Other Ambulatory Visit: Payer: Self-pay | Admitting: Emergency Medicine

## 2020-09-09 ENCOUNTER — Telehealth (HOSPITAL_COMMUNITY): Payer: Self-pay

## 2020-09-09 DIAGNOSIS — J3489 Other specified disorders of nose and nasal sinuses: Secondary | ICD-10-CM | POA: Diagnosis not present

## 2020-09-09 DIAGNOSIS — R059 Cough, unspecified: Secondary | ICD-10-CM | POA: Diagnosis not present

## 2020-09-09 NOTE — Telephone Encounter (Signed)
Pt is not interested in the pulmonary rehab program at this time due to covid. Closed referral.

## 2020-09-17 ENCOUNTER — Other Ambulatory Visit: Payer: PPO

## 2020-09-17 DIAGNOSIS — Z20822 Contact with and (suspected) exposure to covid-19: Secondary | ICD-10-CM | POA: Diagnosis not present

## 2020-09-19 LAB — SARS-COV-2, NAA 2 DAY TAT

## 2020-09-19 LAB — NOVEL CORONAVIRUS, NAA: SARS-CoV-2, NAA: NOT DETECTED

## 2020-09-23 DIAGNOSIS — B349 Viral infection, unspecified: Secondary | ICD-10-CM | POA: Diagnosis not present

## 2020-09-23 DIAGNOSIS — J0100 Acute maxillary sinusitis, unspecified: Secondary | ICD-10-CM | POA: Diagnosis not present

## 2020-10-14 IMAGING — CT CT CHEST LUNG CANCER SCREENING LOW DOSE W/O CM
1 of 2 series · 10 of 20 positions shown, 13 images · non-contrast
Comparison: Low-dose lung cancer screening CT chest dated
05/10/2019

CLINICAL DATA: 68-year-old female former smoker, quit 11 years ago,
with 38 pack-year history of smoking, for follow-up lung cancer
screening

EXAM:
CT CHEST WITHOUT CONTRAST LOW-DOSE FOR LUNG CANCER SCREENING
TECHNIQUE: Multidetector CT imaging of the chest was performed following the
standard protocol without IV contrast.

[ct lung segmentation data · axial · 0.69mm/px · z∈[-371,-371]mm · 10 of 326 frames shown]
[frame 1/326  mediastinal]
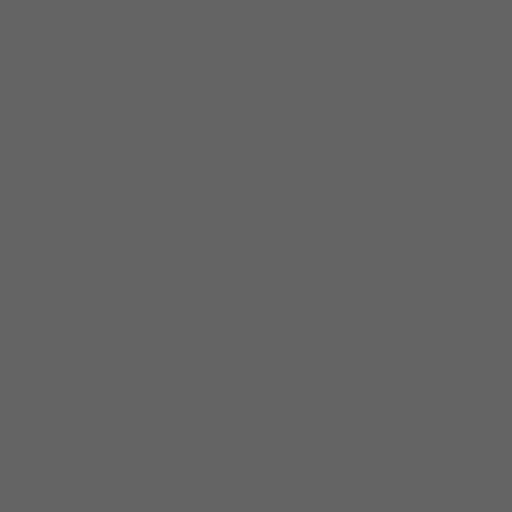
[frame 1/326  lung]
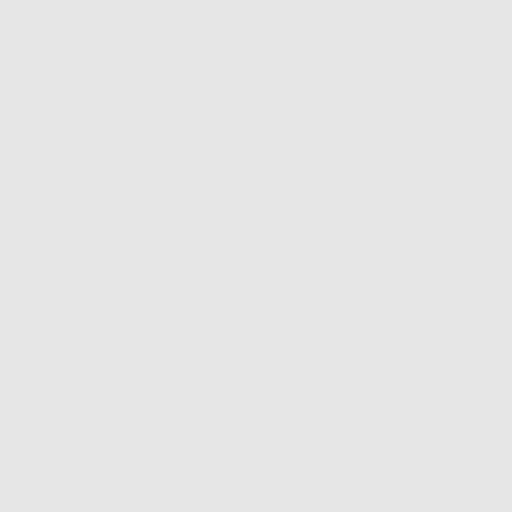
[frame 37/326  lung]
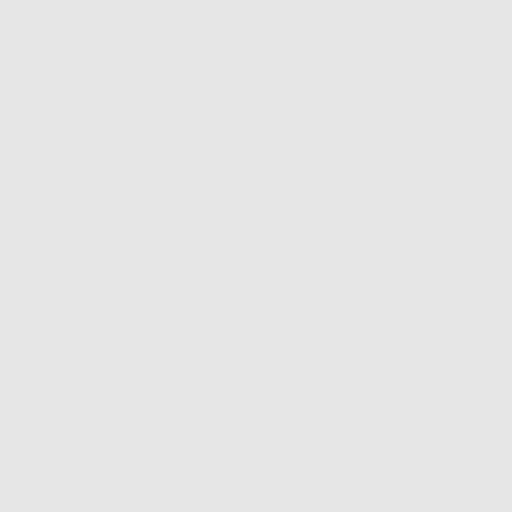
[frame 73/326  lung]
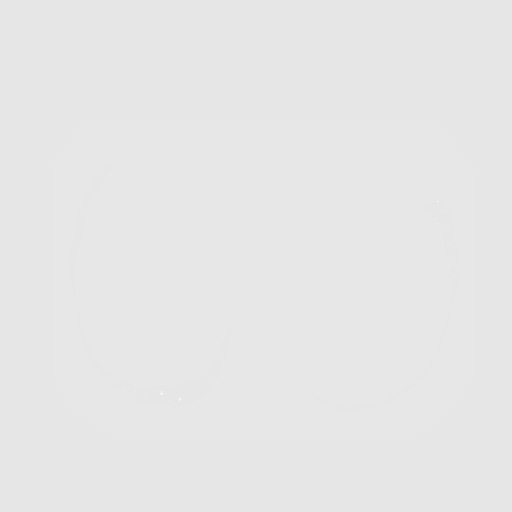
[frame 109/326  lung]
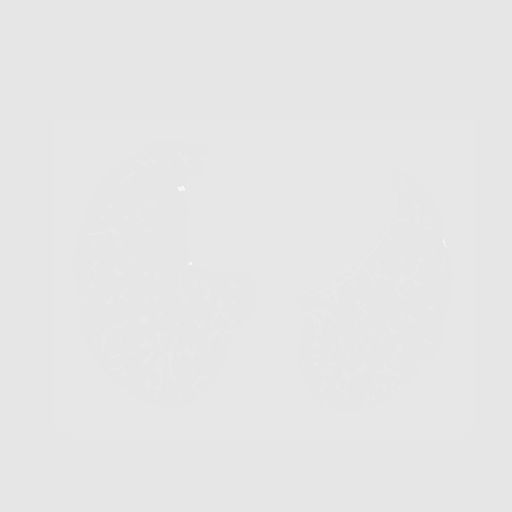
[frame 145/326  mediastinal]
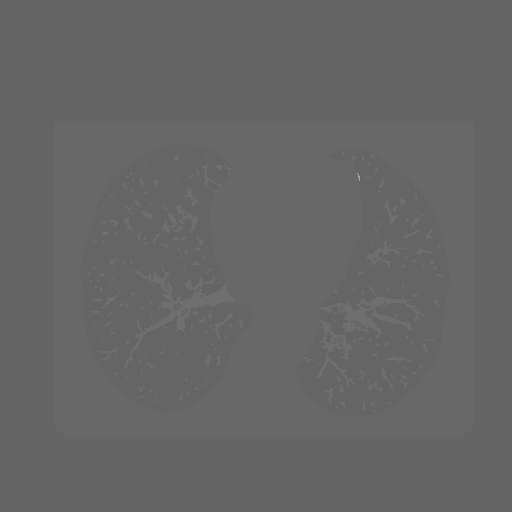
[frame 145/326  lung]
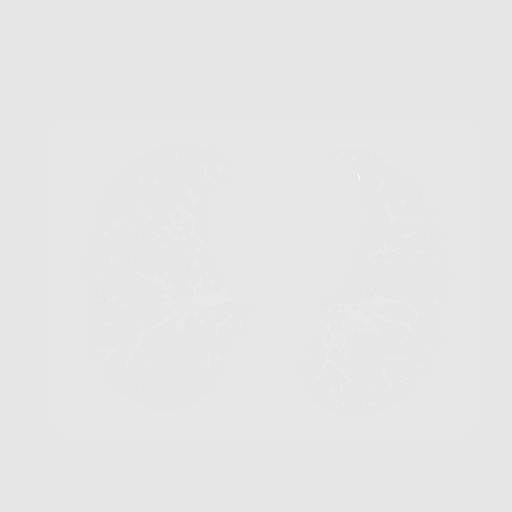
[frame 181/326  lung]
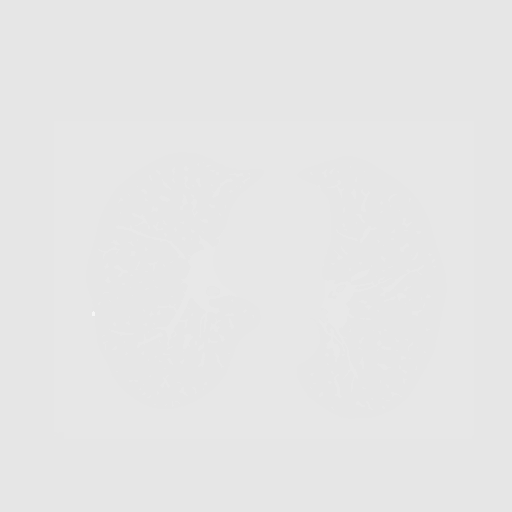
[frame 217/326  lung]
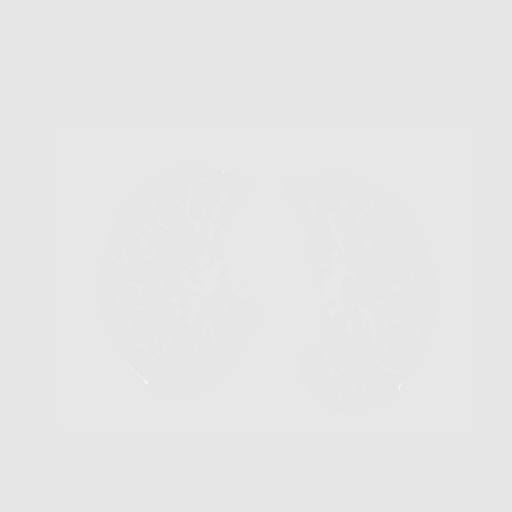
[frame 253/326  lung]
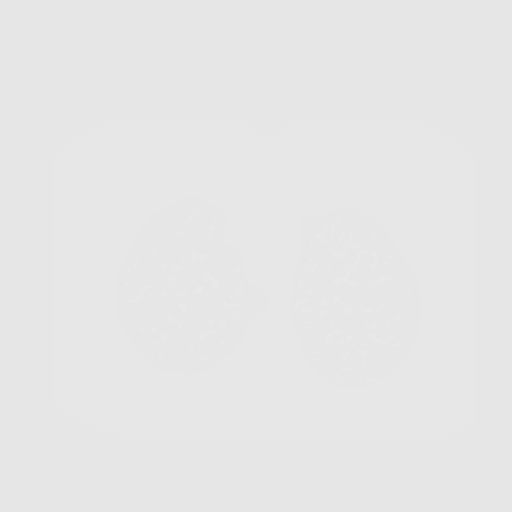
[frame 289/326  mediastinal]
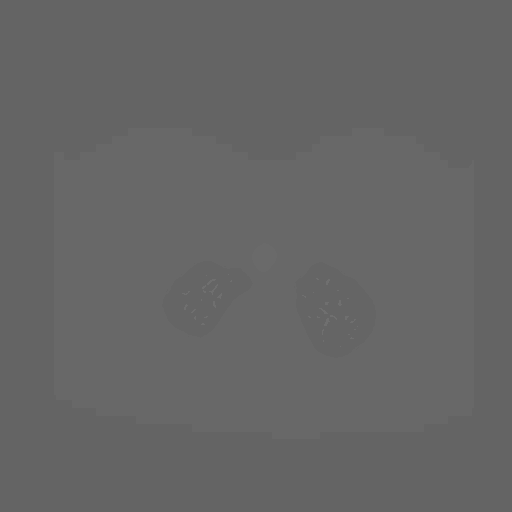
[frame 289/326  lung]
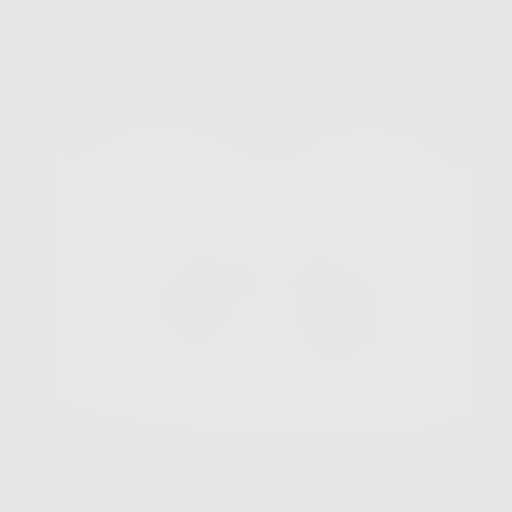
[frame 326/326  lung]
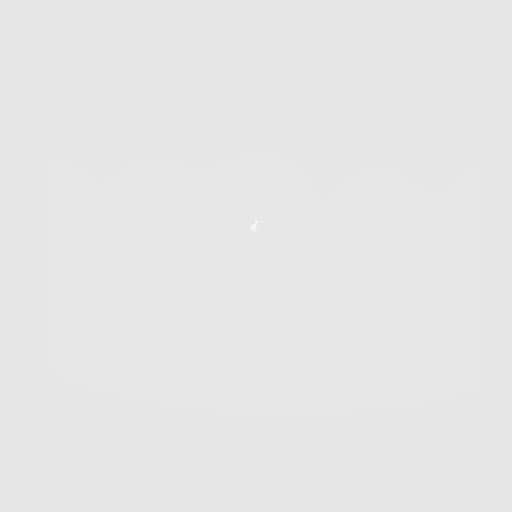

[10 of 20 positions shown; findings below may reference images not displayed]

FINDINGS: Cardiovascular: Heart is normal in size.  No pericardial effusion.

No evidence of thoracic aortic aneurysm. Atherosclerotic
calcifications of the aortic arch.

Mild three-vessel coronary atherosclerosis.

Mediastinum/Nodes: No suspicious mediastinal lymphadenopathy.

Lungs/Pleura: Mild centrilobular emphysematous changes, upper lung
predominant.

No focal consolidation.

Scattered small pulmonary nodules, measuring up to 3.7 mm in the
left lower, grossly unchanged.

No pleural effusion or pneumothorax.

Upper Abdomen: Visualized upper abdomen is notable for prior
cholecystectomy and vascular calcifications.

Musculoskeletal: Mild degenerative changes of the visualized
thoracolumbar spine.
IMPRESSION: Lung-RADS 2, benign appearance or behavior. Continue annual
screening with low-dose chest CT without contrast in 12 months.

Aortic Atherosclerosis (N6THP-IZ0.0) and Emphysema ([id number].D).

## 2020-11-04 ENCOUNTER — Ambulatory Visit: Payer: PPO | Admitting: Acute Care

## 2020-11-04 ENCOUNTER — Other Ambulatory Visit: Payer: Self-pay

## 2020-11-04 ENCOUNTER — Encounter: Payer: Self-pay | Admitting: Acute Care

## 2020-11-04 VITALS — BP 118/78 | HR 73 | Temp 97.2°F | Ht 67.5 in | Wt 224.2 lb

## 2020-11-04 DIAGNOSIS — J449 Chronic obstructive pulmonary disease, unspecified: Secondary | ICD-10-CM | POA: Diagnosis not present

## 2020-11-04 DIAGNOSIS — Z87891 Personal history of nicotine dependence: Secondary | ICD-10-CM | POA: Diagnosis not present

## 2020-11-04 DIAGNOSIS — R634 Abnormal weight loss: Secondary | ICD-10-CM | POA: Diagnosis not present

## 2020-11-04 NOTE — Progress Notes (Signed)
History of Present Illness Annette [name] is a 70 y.o. female   former smoker ( 38 pack year smoking history, quit 2010) with COPD a history of CAD.She is followed by Dr. Lamonte Sakai  Maintenance : Symbicort Rescue : [facility] Lung Cancer Screening Due 04/2021 Zyrtec for allergies as needed X Faythe Dingwall and Flonase for sinus issues as needed Nasal Irrigations as needed Mucinex and Flutter Valve as needed for chest congestion  HPI 70 year old former smoker (~40 pack years) with a history of coronary artery disease, hypertension, hypothyroidism.   She began to notice some exertional SOB in 2016, has slowly worsened. Has been occasionally been assoc with edema, CP. She used to be able to walk 2-3 miles a day, but as recently as July she could not walk a block. She has gained about 50 lbs over 5 years. She has a lot of non-productive cough, hears some exp wheeze. Sometimes has chest tightness. Reassuring cards eval by Dr Ellyn Hack. Occasional GERD sx when she overeats. Occasional environmental allergies.    11/04/2020 Pt. Presents for follow up. She was last seen 07/29/2020. At that time she was not on maintenance inhalers. She had Covid in January. She was not hospitalized, but she was in bed x 3 weeks. She has recovered, but still gets fatigued at times more easily than her baseline. She is trying to walk on her treadmill to increase her stamina. She is compliant with her Symbicort. She would prefer Serevent, but she has had some issues getting this from her pharmacy, sio she is using Symbicort. She goes weeks at a time and does not have to use her rescue inhaler. She uses this more when  pollen levels are high, she has noticed. She is taking Zyrtec at night when she remembers. She does have some wheezing when she has those episodes. She does use Mucinex as needed when she has congestion she cannot cough up.She is also using a flutter valve for mucus clearance. She notes that this has been helping a lot. She had  significant headaches and a sinus infection post Covid. She was treated with antibiotics x 2. We discussed using nasal irrigation to help with this . She is using X Lear and Flonase nasal sprays.I  have offered referral to Pulmonary rehab, she would like to wait until the Covid environment slows down. She denies fever, chest pain, orthopnea or hemoptysis.    Lat lung cancer screening , Lung Rads 2, 12 month follow up Due 04/2021.  Test Results: 04/2020 Lung Cancer Screening IMPRESSION: Lung-RADS 2, benign appearance or behavior. Continue annual screening with low-dose chest CT without contrast in 12 months.   CBC Latest Ref Rng & Units 03/23/2015 03/15/2015 06/01/2013  WBC 4.0 - 10.5 K/uL 11.6(H) 7.4 6.3  Hemoglobin 12.0 - 15.0 g/dL 11.2(L) 14.2 13.8  Hematocrit 36.0 - 46.0 % 33.1(L) 42.4 40.6  Platelets 150 - 400 K/uL 228 332 293.0    BMP Latest Ref Rng & Units 05/31/2019 01/28/2018 01/18/2018  Glucose 65 - 99 mg/dL 98 96 87  BUN 8 - 27 mg/dL 19 20 16   Creatinine 0.57 - 1.00 mg/dL 0.81 0.81 0.88  BUN/Creat Ratio 12 - 28 23 25 18   Sodium 134 - 144 mmol/L 140 139 139  Potassium 3.5 - 5.2 mmol/L 4.6 5.2 5.5(H)  Chloride 96 - 106 mmol/L 95(L) 96 97  CO2 20 - 29 mmol/L 30(H) 28 27  Calcium 8.7 - 10.3 mg/dL 9.8 9.9 9.8    BNP    Component  Value Date/Time   BNP 17.4 05/31/2019 1402    ProBNP No results found for: PROBNP  PFT    Component Value Date/Time   FEV1PRE 1.51 10/10/2019 1622   FEV1POST 1.46 10/10/2019 1622   FVCPRE 2.27 10/10/2019 1622   FVCPOST 2.20 10/10/2019 1622   TLC 5.05 10/10/2019 1622   DLCOUNC 18.30 10/10/2019 1622   PREFEV1FVCRT 66 10/10/2019 1622   PSTFEV1FVCRT 67 10/10/2019 1622    No results found.   Past medical hx Past Medical History:  Diagnosis Date  . Arthritis    Right hip, end stage  . Coronary artery calcification seen on computed tomography 02/2018   Coronary calcium score 79.  Coronary CT angiogram: Mild CAD and proximal LAD, proximal RCA  and mid LCx.  Moderate plaque ostial LCx.  Mildly dilated pulmonary artery, suggestive of possible pulmonary hypertension  . Difficulty sleeping    DUE TO PAIN  . Emphysema of lung ([place])   . Fluid retention    TAKES HCTZ  . Hemorrhoids   . Hyperlipidemia   . Hypertension   . Hypothyroidism   . Irritable bowel syndrome   . Shingles 2004  . Tremors of nervous system    TAKES PROPRANOLOL TO TX  . Varicose veins      Social History   Tobacco Use  . Smoking status: Former Smoker    Packs/day: 1.00    Years: 38.00    Pack years: 38.00    Quit date: 03/14/2009    Years since quitting: 11.6  . Smokeless tobacco: Never Used  Substance Use Topics  . Alcohol use: Yes    Comment: beer every 6-8 months  . Drug use: No    Annette [name] reports that she quit smoking about 11 years ago. She has a 38.00 pack-year smoking history. She has never used smokeless tobacco. She reports current alcohol use. She reports that she does not use drugs.  Tobacco Cessation: Former smoker with a 38 pack year smoking history  Past surgical hx, Family hx, Social hx all reviewed.  Current Outpatient Medications on File Prior to Visit  Medication Sig  . albuterol (VENTOLIN HFA) 108 (90 Base) MCG/ACT inhaler Inhale 2 puffs into the lungs every 6 (six) hours as needed for wheezing or shortness of breath.  . ALPRAZolam (XANAX) 0.25 MG tablet TK 1 T PO QD PRN  . aspirin EC 81 MG tablet Take 81 mg by mouth daily.  . budesonide-formoterol (SYMBICORT) 80-4.5 MCG/ACT inhaler Inhale 2 puffs into the lungs 2 (two) times daily.  . Cholecalciferol (KP VITAMIN D3) 50 MCG (2000 UT) CAPS Take by mouth.  . Coenzyme Q10 (CO Q-10) 300 MG CAPS 1 capsule  . fluticasone (FLONASE) 50 MCG/ACT nasal spray 1 spray in each nostril  . hydrochlorothiazide (HYDRODIURIL) 25 MG tablet TAKE 1 TABLET(25 MG) BY MOUTH DAILY  . ketoconazole (NIZORAL) 2 % cream as needed.   Marland Kitchen levothyroxine (SYNTHROID, LEVOTHROID) 100 MCG tablet take 1 tablet by  mouth every morning ON AN EMPTY STOMACH  . Multiple Vitamin (MULTIVITAMIN) tablet Take 1 tablet by mouth daily.  Marland Kitchen omeprazole (PRILOSEC) 20 MG capsule Take 20 mg by mouth every morning.  . propranolol ER (INDERAL LA) 60 MG 24 hr capsule Take 60 mg by mouth daily.  . rosuvastatin (CRESTOR) 5 MG tablet Take 5 mg by mouth daily.   No current facility-administered medications on file prior to visit.     Allergies  Allergen Reactions  . Augmentin [Amoxicillin-Pot Clavulanate] Nausea And Vomiting  .  Codeine Other (See Comments)    Causes her to stay awake   . Other Hives and Other (See Comments)    IV contrast   . Iodinated Diagnostic Agents Rash    Review Of Systems:  Constitutional:   No  weight loss, night sweats,  Fevers, chills, fatigue, or  lassitude.  HEENT:   No headaches,  Difficulty swallowing,  Tooth/dental problems, or  Sore throat,                No sneezing, itching, ear ache, nasal congestion, post nasal drip,   CV:  No chest pain,  Orthopnea, PND, swelling in lower extremities, anasarca, dizziness, palpitations, syncope.   GI  No heartburn, indigestion, abdominal pain, nausea, vomiting, diarrhea, change in bowel habits, loss of appetite, bloody stools.   Resp: Occasional  shortness of breath with exertion or at rest.  No excess mucus, no productive cough,  No non-productive cough,  No coughing up of blood.  No change in color of mucus.  Occasional  wheezing.  No chest wall deformity  Skin: no rash or lesions.  GU: no dysuria, change in color of urine, no urgency or frequency.  No flank pain, no hematuria   MS:  No joint pain or swelling.  No decreased range of motion.  No back pain.  Psych:  No change in mood or affect. No depression or anxiety.  No memory loss.   Vital Signs BP 118/78 (BP Location: Left Arm, Cuff Size: Normal)   Pulse 73   Temp (!) 97.2 F (36.2 C) (Oral)   Ht 5' 7.5" (1.715 m)   Wt 224 lb 3.2 oz (101.7 kg)   SpO2 97%   BMI 34.60 kg/m     Physical Exam:  General- No distress,  A&Ox3, pleasant  ENT: No sinus tenderness, TM clear, pale nasal mucosa, no oral exudate,no post nasal drip, no LAN Cardiac: S1, S2, regular rate and rhythm, no murmur Chest: rare wheeze/No  rales/ dullness; no accessory muscle use, no nasal flaring, no sternal retractions Abd.: Soft Non-tender, ND, BS +. Body mass index is 34.6 kg/m. Ext: No clubbing cyanosis, edema Neuro:  normal strength, MAE x 4, A&O x 3 Skin: No rashes, warm and dry Psych: normal mood and behavior   Assessment/Plan COPD and Dyspnea Stable interval Plan Continue Symbicort 2 puffs twice daily Rinse mouth after use. Continue Rescue inhaler  As needed for breakthrough shortness of breath.  Continue Zyrtec at bedtime, daily when pollen levels are high. Continue to work on increasing your activity. Let me know when you are ready for [place] to re-refer to Pulmonary Rehab Use Mucinex as needed for chest congestion.  Work on weight loss. We will refer you to healthy weight and wellness for weight loss.  Continue Flonase and X LEAR nasal sprays. Consider Neti Pot nasal irrigations Use flutter valve and Mucinex as needed for chest congestion. Lung Cancer Screening due  04/2021 ( Screening program will order).Please message Langley Gauss Follow up in 3 months with Dr. Lamonte Sakai, or Judson Roch NP Call us if you need a prescription for Serevent.  Please contact office for sooner follow up if symptoms do not improve or worsen or seek emergency care   Covid 08/2020 Did not require hospitalization, but was in bed x 3 weeks Vaccinated Residual headache and sinus issues Plan As above Follow up in 3 months to evaluate for any long term complications  Body mass index is 34.6 kg/m.  Plan Referral to healthy weight and wellness.  Tobacco Abuse 38 pack year smoking history Quit 2010 Plan Counseling to remain smoke free Follow up LDCT 04/2021    Magdalen Spatz, NP 11/04/2020  11:27  AM

## 2020-11-04 NOTE — Patient Instructions (Addendum)
It is good to see you today. Continue Symbicort 2 puffs twice daily Rinse mouth after use. Continue Rescue inhaler  As needed for breakthrough shortness of breath.  Continue Zyrtec at bedtime, daily when pollen levels are high. Continue to work on increasing your activity. Let me know when you are ready for [place] to re-refer to Pulmonary Rehab Use Mucinex as needed for chest congestion.  Work on weight loss. We will refer you to healthy weight and wellness for weight loss.  Continue Flonase and X LEAR nasal sprays. Consider Neti Pot nasal irrigations Use flutter valve and Mucinex as needed for chest congestion. Lung Cancer Screening due  04/2021 ( Screening program will order).Please message Langley Gauss Follow up in 3 months with Dr. Lamonte Sakai, or Judson Roch NP Call us if you need a prescription for Serevent.  Please contact office for sooner follow up if symptoms do not improve or worsen or seek emergency care

## 2020-11-19 DIAGNOSIS — I2510 Atherosclerotic heart disease of native coronary artery without angina pectoris: Secondary | ICD-10-CM | POA: Diagnosis not present

## 2020-11-19 DIAGNOSIS — J449 Chronic obstructive pulmonary disease, unspecified: Secondary | ICD-10-CM | POA: Diagnosis not present

## 2020-11-19 DIAGNOSIS — I119 Hypertensive heart disease without heart failure: Secondary | ICD-10-CM | POA: Diagnosis not present

## 2020-11-19 DIAGNOSIS — K580 Irritable bowel syndrome with diarrhea: Secondary | ICD-10-CM | POA: Diagnosis not present

## 2020-11-19 DIAGNOSIS — E7800 Pure hypercholesterolemia, unspecified: Secondary | ICD-10-CM | POA: Diagnosis not present

## 2020-11-19 DIAGNOSIS — J309 Allergic rhinitis, unspecified: Secondary | ICD-10-CM | POA: Diagnosis not present

## 2020-11-19 DIAGNOSIS — R251 Tremor, unspecified: Secondary | ICD-10-CM | POA: Diagnosis not present

## 2020-11-19 DIAGNOSIS — E039 Hypothyroidism, unspecified: Secondary | ICD-10-CM | POA: Diagnosis not present

## 2020-11-19 DIAGNOSIS — I700 Atherosclerosis of aorta: Secondary | ICD-10-CM | POA: Diagnosis not present

## 2020-11-19 DIAGNOSIS — M1990 Unspecified osteoarthritis, unspecified site: Secondary | ICD-10-CM | POA: Diagnosis not present

## 2020-11-19 DIAGNOSIS — Z0000 Encounter for general adult medical examination without abnormal findings: Secondary | ICD-10-CM | POA: Diagnosis not present

## 2020-11-19 DIAGNOSIS — Z8619 Personal history of other infectious and parasitic diseases: Secondary | ICD-10-CM | POA: Diagnosis not present

## 2020-11-19 NOTE — Telephone Encounter (Signed)
Please advise on patient mychart message  Annette [name], I assumed they would call me, but I have not heard from them.  Do you know if the referral has been sent to them? Thanks, [name]

## 2020-11-19 NOTE — Telephone Encounter (Signed)
Called Weight [facility] and spoke w/ [place] who states they are currently working on [place] referrals but advised that the pt can call the referral coordinator [place] directly to sched [phone]. Informed pt of this information. Nothing further needed.

## 2020-11-21 ENCOUNTER — Other Ambulatory Visit (HOSPITAL_COMMUNITY): Payer: Self-pay

## 2020-11-21 MED ORDER — PROPRANOLOL HCL ER 60 MG PO CP24
ORAL_CAPSULE | ORAL | 1 refills | Status: DC
  Filled 2020-11-21 – 2020-11-22 (×3): qty 90, 90d supply, fill #0
  Filled 2021-02-18: qty 90, 90d supply, fill #1

## 2020-11-21 MED ORDER — LEVOTHYROXINE SODIUM 125 MCG PO TABS
ORAL_TABLET | ORAL | 1 refills | Status: DC
  Filled 2020-11-21 – 2020-11-22 (×2): qty 90, 90d supply, fill #0
  Filled 2021-02-18: qty 90, 90d supply, fill #1

## 2020-11-21 MED ORDER — HYDROCHLOROTHIAZIDE 25 MG PO TABS
ORAL_TABLET | ORAL | 1 refills | Status: DC
  Filled 2020-11-21: qty 90, 90d supply, fill #0

## 2020-11-21 MED ORDER — ROSUVASTATIN CALCIUM 5 MG PO TABS
ORAL_TABLET | ORAL | 1 refills | Status: DC
  Filled 2020-11-21: qty 50, 90d supply, fill #0
  Filled 2021-02-18: qty 50, 90d supply, fill #1
  Filled 2021-04-07: qty 36, 90d supply, fill #2

## 2020-11-22 ENCOUNTER — Other Ambulatory Visit (HOSPITAL_COMMUNITY): Payer: Self-pay

## 2020-12-05 NOTE — Telephone Encounter (Signed)
Judson Roch, please see mychart message sent by pt and advise:   To: [facility] POOL    From: [name]    Created: 12/05/2020 11:00 AM     *-*-*This message was handled on 12/05/2020 11:59 AM by Myrle Sheng, Lesleyanne Politte P*-*-*  Alcide Goodness changed from [place] to [facility] and would like to go back to using Serevent inhaler instead of Symbicort as I feel it works better for me.  If you could refill that for 90 days, I would appreciate it.   Thank you, Annette [name]     Please let us know if you are okay with [place] sending a 90-day Rx to pharmacy for pt of the Serevent inhaler.

## 2020-12-11 ENCOUNTER — Ambulatory Visit (INDEPENDENT_AMBULATORY_CARE_PROVIDER_SITE_OTHER): Payer: PPO | Admitting: Bariatrics

## 2020-12-11 ENCOUNTER — Encounter (INDEPENDENT_AMBULATORY_CARE_PROVIDER_SITE_OTHER): Payer: Self-pay | Admitting: Bariatrics

## 2020-12-11 ENCOUNTER — Other Ambulatory Visit: Payer: Self-pay

## 2020-12-11 ENCOUNTER — Other Ambulatory Visit (HOSPITAL_COMMUNITY): Payer: Self-pay

## 2020-12-11 VITALS — BP 121/80 | HR 71 | Temp 98.4°F | Ht 67.0 in | Wt 224.0 lb

## 2020-12-11 DIAGNOSIS — R0602 Shortness of breath: Secondary | ICD-10-CM

## 2020-12-11 DIAGNOSIS — E559 Vitamin D deficiency, unspecified: Secondary | ICD-10-CM | POA: Diagnosis not present

## 2020-12-11 DIAGNOSIS — R7309 Other abnormal glucose: Secondary | ICD-10-CM | POA: Diagnosis not present

## 2020-12-11 DIAGNOSIS — E038 Other specified hypothyroidism: Secondary | ICD-10-CM

## 2020-12-11 DIAGNOSIS — Z0289 Encounter for other administrative examinations: Secondary | ICD-10-CM

## 2020-12-11 DIAGNOSIS — I10 Essential (primary) hypertension: Secondary | ICD-10-CM

## 2020-12-11 DIAGNOSIS — R5383 Other fatigue: Secondary | ICD-10-CM

## 2020-12-11 DIAGNOSIS — Z6835 Body mass index (BMI) 35.0-35.9, adult: Secondary | ICD-10-CM

## 2020-12-11 DIAGNOSIS — E7800 Pure hypercholesterolemia, unspecified: Secondary | ICD-10-CM

## 2020-12-11 DIAGNOSIS — Z1331 Encounter for screening for depression: Secondary | ICD-10-CM

## 2020-12-11 MED ORDER — SEREVENT DISKUS 50 MCG/DOSE IN AEPB
1.0000 | INHALATION_SPRAY | Freq: Two times a day (BID) | RESPIRATORY_TRACT | 1 refills | Status: DC
  Filled 2020-12-11: qty 180, 90d supply, fill #0
  Filled 2021-02-18: qty 180, 90d supply, fill #1

## 2020-12-11 NOTE — Progress Notes (Signed)
Reviewed

## 2020-12-12 ENCOUNTER — Other Ambulatory Visit (HOSPITAL_COMMUNITY): Payer: Self-pay

## 2020-12-12 LAB — INSULIN, RANDOM: INSULIN: 21.6 u[IU]/mL (ref 2.6–24.9)

## 2020-12-12 LAB — HEMOGLOBIN A1C
Est. average glucose Bld gHb Est-mCnc: 120 mg/dL
Hgb A1c MFr Bld: 5.8 % — ABNORMAL HIGH (ref 4.8–5.6)

## 2020-12-12 LAB — VITAMIN D 25 HYDROXY (VIT D DEFICIENCY, FRACTURES): Vit D, 25-Hydroxy: 27.3 ng/mL — ABNORMAL LOW (ref 30.0–100.0)

## 2020-12-12 NOTE — Progress Notes (Signed)
Dear Annette Form, NP,   Thank you for referring Annette [name] to our clinic. The following note includes my evaluation and treatment recommendations.  Chief Complaint:   OBESITY Annette [name] (MR# [id number]) is a 70 y.o. female who presents for evaluation and treatment of obesity and related comorbidities. Current BMI is Body mass index is 35.08 kg/m. Annette [name] has been struggling with her weight for many years and has been unsuccessful in either losing weight, maintaining weight loss, or reaching her healthy weight goal.  Annette [name] is currently in the action stage of change and ready to dedicate time achieving and maintaining a healthier weight. Annette [name] is interested in becoming our patient and working on intensive lifestyle modifications including (but not limited to) diet and exercise for weight loss.  Annette [name] does like to cook. She craves sweets and bread.  Annette [name]'s habits were reviewed today and are as follows: she struggles with family and or coworkers weight loss sabotage, her desired weight loss is 59 lbs, she started gaining weight in 2015, her heaviest weight ever was 237 pounds, she has significant food cravings issues, she is frequently drinking liquids with calories, she frequently makes poor food choices, she has problems with excessive hunger, she frequently eats larger portions than normal and she struggles with emotional eating.  Depression Screen Annette [name]'s Food and Mood (modified PHQ-9) score was 18.  Depression screen Annette [name] 2/9 12/11/2020  Decreased Interest 3  Down, Depressed, Hopeless 3  PHQ - 2 Score 6  Altered sleeping 2  Tired, decreased energy 3  Change in appetite 2  Feeling bad or failure about yourself  3  Trouble concentrating 1  Moving slowly or fidgety/restless 1  Suicidal thoughts 0  PHQ-9 Score 18  Difficult doing work/chores Somewhat difficult   Subjective:   1. Other fatigue Annette [name] admits to daytime somnolence and admits to waking up still tired. Patent has  a history of symptoms of daytime fatigue and morning fatigue. Annette [name] generally gets 3 or 4 hours of sleep per night, and states that she has poor sleep quality. Snoring is present. Apneic episodes are not present. Epworth Sleepiness Score is 11.  2. SOB (shortness of breath) on exertion Annette [name] notes increasing shortness of breath with exercising and seems to be worsening over time with weight gain. She notes getting out of breath sooner with activity than she used to. This has gotten worse recently. Annette [name] denies shortness of breath at rest or orthopnea.  3. Other specified hypothyroidism Annette [name] is taking levothyroxine. Levothyroxine was increased at last visit with Dr. Laurann [name].  4. Hypercholesterolemia Annette [name] is taking atorvastatin and CO Q-10 300 mg.  5. Essential hypertension Annette [name] is propranolol ER and HCTZ.  6. Vitamin D deficiency Annette [name] is taking OTC Vit D3.  7. Elevated glucose Annette [name] denies polyphagia.  Assessment/Plan:   1. Other fatigue Annette [name] does feel that her weight is causing her energy to be lower than it should be. Fatigue may be related to obesity, depression or many other causes. Labs will be ordered, and in the meanwhile, Annette [name] will focus on self care including making healthy food choices, increasing physical activity and focusing on stress reduction. Check labs today.  - EKG 12-Lead - Hemoglobin A1c - Insulin, random - VITAMIN D 25 Hydroxy (Vit-D Deficiency, Fractures)  2. SOB (shortness of breath) on exertion Annette [name] does feel that she gets out of breath more easily that she used to when she exercises. Annette [name]'s shortness of breath appears to be obesity related and  exercise induced. She has agreed to work on weight loss and gradually increase exercise to treat her exercise induced shortness of breath. Will continue to monitor closely.  3. Other specified hypothyroidism Patient with long-standing hypothyroidism, on levothyroxine therapy. She appears euthyroid. Orders and  follow up as documented in patient record. Continue current treatment plan.  Counseling . Good thyroid control is important for overall health. Supratherapeutic thyroid levels are dangerous and will not improve weight loss results. . The correct way to take levothyroxine is fasting, with water, separated by at least 30 minutes from breakfast, and separated by more than 4 hours from calcium, iron, multivitamins, acid reflux medications (PPIs).   4. Hypercholesterolemia Cardiovascular risk and specific lipid/LDL goals reviewed.  We discussed several lifestyle modifications today and Annette [name] will continue to work on diet, exercise and weight loss efforts. Orders and follow up as documented in patient record. Continue current treatment plan.increase PUFA's and MUFA's.  Counseling Intensive lifestyle modifications are the first line treatment for this issue. . Dietary changes: Increase soluble fiber. Decrease simple carbohydrates. . Exercise changes: Moderate to vigorous-intensity aerobic activity 150 minutes per week if tolerated. . Lipid-lowering medications: see documented in medical record.  5. Essential hypertension Annette [name] is working on healthy weight loss and exercise to improve blood pressure control. We will watch for signs of hypotension as she continues her lifestyle modifications. Continue current treatment plan.  6. Vitamin D deficiency Low Vitamin D level contributes to fatigue and are associated with obesity, breast, and colon cancer. She agrees to continue to take Vitamin D3 and will follow-up for routine testing of Vitamin D, at least 2-3 times per year to avoid over-replacement. Check labs today.  - VITAMIN D 25 Hydroxy (Vit-D Deficiency, Fractures)  7. Elevated glucose Fasting labs will be obtained and results with be discussed with Annette [name] in 2 weeks at her follow up visit. In the meanwhile Annette [name] was started on a lower simple carbohydrate diet and will work on weight loss efforts.  Check labs today.  - Hemoglobin A1c - Insulin, random  8. Depression screen Annette [name] had a positive depression screening. Depression is commonly associated with obesity and often results in emotional eating behaviors. We will monitor this closely and work on CBT to help improve the non-hunger eating patterns. Referral to Psychology may be required if no improvement is seen as she continues in our clinic.  9. Class 2 severe obesity with serious comorbidity and body mass index (BMI) of 35.0 to 35.9 in adult, unspecified obesity type (HCC) Annette [name] is currently in the action stage of change and her goal is to continue with weight loss efforts. I recommend Annette [name] begin the structured treatment plan as follows:  She has agreed to the Category 1 Plan.  Meal plan Mindful eating Decrease or eliminate sweets Reviewed labs with pt: CBC, CMP, lipid, and TSH.  Exercise goals: Activity lower than normal   Behavioral modification strategies: increasing lean protein intake, decreasing simple carbohydrates, increasing vegetables, increasing water intake, decreasing eating out, no skipping meals, meal planning and cooking strategies, keeping healthy foods in the home and planning for success.  She was informed of the importance of frequent follow-up visits to maximize her success with intensive lifestyle modifications for her multiple health conditions. She was informed we would discuss her lab results at her next visit unless there is a critical issue that needs to be addressed sooner. Annette [name] agreed to keep her next visit at the agreed upon time to discuss these results.  Objective:  Blood pressure 121/80, pulse 71, temperature 98.4 F (36.9 C), height 5\' 7"  (1.702 m), weight 224 lb (101.6 kg), SpO2 94 %. Body mass index is 35.08 kg/m.  EKG: Normal sinus rhythm, rate 72.  Indirect Calorimeter completed today shows a VO2 of 169 and a REE of 1173.  Her calculated basal metabolic rate is 5885 thus her basal  metabolic rate is worse than expected.  General: Cooperative, alert, well developed, in no acute distress. HEENT: Conjunctivae and lids unremarkable. Cardiovascular: Regular rhythm.  Lungs: Normal work of breathing. Neurologic: No focal deficits.   Lab Results  Component Value Date   CREATININE 0.81 05/31/2019   BUN 19 05/31/2019   NA 140 05/31/2019   K 4.6 05/31/2019   CL 95 (L) 05/31/2019   CO2 30 (H) 05/31/2019   Lab Results  Component Value Date   ALT 15 06/10/2010   AST 19 06/10/2010   ALKPHOS 92 06/10/2010   BILITOT 0.4 06/10/2010   Lab Results  Component Value Date   HGBA1C 5.8 (H) 12/11/2020   Lab Results  Component Value Date   INSULIN 21.6 12/11/2020   No results found for: TSH No results found for: CHOL, HDL, LDLCALC, LDLDIRECT, TRIG, CHOLHDL Lab Results  Component Value Date   WBC 11.6 (H) 03/23/2015   HGB 11.2 (L) 03/23/2015   HCT 33.1 (L) 03/23/2015   MCV 89.7 03/23/2015   PLT 228 03/23/2015   No results found for: IRON, TIBC, FERRITIN Obesity Behavioral Intervention:   Approximately 15 minutes were spent on the discussion below.  ASK: We discussed the diagnosis of obesity with Daisia today and Annette [name] agreed to give [place] permission to discuss obesity behavioral modification therapy today.  ASSESS: Annette [name] has the diagnosis of obesity and her BMI today is 35.1. Annette [name] is in the action stage of change.   ADVISE: Lauretta was educated on the multiple health risks of obesity as well as the benefit of weight loss to improve her health. She was advised of the need for long term treatment and the importance of lifestyle modifications to improve her current health and to decrease her risk of future health problems.  AGREE: Multiple dietary modification options and treatment options were discussed and Mykhia agreed to follow the recommendations documented in the above note.  ARRANGE: Deneise was educated on the importance of frequent visits to treat obesity as  outlined per CMS and USPSTF guidelines and agreed to schedule her next follow up appointment today.  Attestation Statements:   Reviewed by clinician on day of visit: allergies, medications, problem list, medical history, surgical history, family history, social history, and previous encounter notes.  Coral Ceo, am acting as Location manager for [employer], DO.  I have reviewed the above documentation for accuracy and completeness, and I agree with the above. Jearld Lesch, DO

## 2020-12-16 ENCOUNTER — Encounter (INDEPENDENT_AMBULATORY_CARE_PROVIDER_SITE_OTHER): Payer: Self-pay | Admitting: Bariatrics

## 2020-12-16 DIAGNOSIS — Z1211 Encounter for screening for malignant neoplasm of colon: Secondary | ICD-10-CM | POA: Diagnosis not present

## 2020-12-16 DIAGNOSIS — R7303 Prediabetes: Secondary | ICD-10-CM | POA: Insufficient documentation

## 2020-12-16 DIAGNOSIS — E039 Hypothyroidism, unspecified: Secondary | ICD-10-CM | POA: Insufficient documentation

## 2020-12-16 DIAGNOSIS — E559 Vitamin D deficiency, unspecified: Secondary | ICD-10-CM | POA: Insufficient documentation

## 2020-12-16 DIAGNOSIS — I119 Hypertensive heart disease without heart failure: Secondary | ICD-10-CM | POA: Insufficient documentation

## 2020-12-16 DIAGNOSIS — H35039 Hypertensive retinopathy, unspecified eye: Secondary | ICD-10-CM | POA: Insufficient documentation

## 2020-12-24 ENCOUNTER — Other Ambulatory Visit (HOSPITAL_COMMUNITY): Payer: Self-pay

## 2020-12-24 MED ORDER — PROCTOFOAM HC 1-1 % EX FOAM
CUTANEOUS | 0 refills | Status: DC
  Filled 2020-12-24: qty 10, 5d supply, fill #0

## 2020-12-25 ENCOUNTER — Other Ambulatory Visit: Payer: Self-pay

## 2020-12-25 ENCOUNTER — Ambulatory Visit (INDEPENDENT_AMBULATORY_CARE_PROVIDER_SITE_OTHER): Payer: PPO | Admitting: Bariatrics

## 2020-12-25 ENCOUNTER — Other Ambulatory Visit (HOSPITAL_COMMUNITY): Payer: Self-pay

## 2020-12-25 ENCOUNTER — Encounter (INDEPENDENT_AMBULATORY_CARE_PROVIDER_SITE_OTHER): Payer: Self-pay | Admitting: Bariatrics

## 2020-12-25 VITALS — BP 133/86 | HR 78 | Temp 97.4°F | Ht 67.0 in | Wt 218.0 lb

## 2020-12-25 DIAGNOSIS — E559 Vitamin D deficiency, unspecified: Secondary | ICD-10-CM

## 2020-12-25 DIAGNOSIS — K648 Other hemorrhoids: Secondary | ICD-10-CM | POA: Diagnosis not present

## 2020-12-25 DIAGNOSIS — R7303 Prediabetes: Secondary | ICD-10-CM

## 2020-12-25 DIAGNOSIS — Z6835 Body mass index (BMI) 35.0-35.9, adult: Secondary | ICD-10-CM

## 2020-12-25 MED ORDER — VITAMIN D (ERGOCALCIFEROL) 1.25 MG (50000 UNIT) PO CAPS
50000.0000 [IU] | ORAL_CAPSULE | ORAL | 0 refills | Status: DC
  Filled 2020-12-25: qty 4, 28d supply, fill #0

## 2020-12-25 MED ORDER — HYDROCORTISONE ACETATE 25 MG RE SUPP
25.0000 mg | Freq: Two times a day (BID) | RECTAL | 1 refills | Status: DC
  Filled 2020-12-25 (×2): qty 12, 6d supply, fill #0

## 2020-12-25 MED ORDER — HYDROCORTISONE ACE-PRAMOXINE 1-1 % EX CREA
TOPICAL_CREAM | CUTANEOUS | 0 refills | Status: DC
  Filled 2020-12-25: qty 30, 15d supply, fill #0

## 2020-12-25 NOTE — Progress Notes (Signed)
anucort

## 2020-12-26 NOTE — Progress Notes (Signed)
Chief Complaint:   OBESITY Annette [name] is here to discuss her progress with her obesity treatment plan along with follow-up of her obesity related diagnoses. Annette [name] is on the Category 1 Plan and states she is following her eating plan approximately 100% of the time. Annette [name] states she is walking/stretching/bands 30-40 minutes 6 times per week.  Today's visit was #: 2 Starting weight: 224 lbs Starting date: 12/11/2020 Today's weight: 218 lbs Today's date: 12/25/2020 Total lbs lost to date: 6 Total lbs lost since last in-office visit: 6  Interim History: Annette [name] is down 6 lbs since her first visit. She had a headache the first few days but after that she did ok.  Subjective:   1. Vitamin D insufficiency Annette [name]'s Vit D level is 27.3. She is taking OTC Vit D daily.  2. Pre-diabetes Annette [name]'s A1c is 5.8 and insulin level 21.6. She is not taking any medications.  3. Other hemorrhoids Annette [name] has an occasional flare up.  Assessment/Plan:   1. Vitamin D insufficiency Low Vitamin D level contributes to fatigue and are associated with obesity, breast, and colon cancer. She agrees to start to take prescription Vitamin D @50 ,000 IU every week and will follow-up for routine testing of Vitamin D, at least 2-3 times per year to avoid over-replacement. - Vitamin D, Ergocalciferol, (DRISDOL) 1.25 MG (50000 UNIT) CAPS capsule; Take 1 capsule (50,000 Units total) by mouth every 7 (seven) days.  Dispense: 4 capsule; Refill: 0  2. Pre-diabetes Bruce will continue to work on weight loss, exercise, and decreasing simple carbohydrates to help decrease the risk of diabetes. Increase healthy fats and protein.  3. Other hemorrhoids Anusol-HC 25 mg suppository, as prescribed below.  - hydrocortisone (ANUSOL-HC) 25 MG suppository; Unwrap and insert 1 suppository (25 mg total) rectally 2 (two) times daily.  Dispense: 12 suppository; Refill: 1  4. Obesity, current BMI 25  Annette [name] is currently in the action stage of  change. As such, her goal is to continue with weight loss efforts. She has agreed to the Category 1 Plan.   Meal plan Intentional eating 12/11/2020 labs reviewed with pt.  Exercise goals: Decreased exercise due to hemorrhoids.  Behavioral modification strategies: increasing lean protein intake, decreasing simple carbohydrates, increasing vegetables, increasing water intake, decreasing eating out, no skipping meals, meal planning and cooking strategies, keeping healthy foods in the home and planning for success.  Annette [name] has agreed to follow-up with our clinic in 2 weeks. She was informed of the importance of frequent follow-up visits to maximize her success with intensive lifestyle modifications for her multiple health conditions.   Objective:   Blood pressure 133/86, pulse 78, temperature (!) 97.4 F (36.3 C), height 5\' 7"  (1.702 m), weight 218 lb (98.9 kg), SpO2 94 %. Body mass index is 34.14 kg/m.  General: Cooperative, alert, well developed, in no acute distress. HEENT: Conjunctivae and lids unremarkable. Cardiovascular: Regular rhythm.  Lungs: Normal work of breathing. Neurologic: No focal deficits.   Lab Results  Component Value Date   CREATININE 0.81 05/31/2019   BUN 19 05/31/2019   NA 140 05/31/2019   K 4.6 05/31/2019   CL 95 (L) 05/31/2019   CO2 30 (H) 05/31/2019   Lab Results  Component Value Date   ALT 15 06/10/2010   AST 19 06/10/2010   ALKPHOS 92 06/10/2010   BILITOT 0.4 06/10/2010   Lab Results  Component Value Date   HGBA1C 5.8 (H) 12/11/2020   Lab Results  Component Value Date   INSULIN 21.6  12/11/2020   No results found for: TSH No results found for: CHOL, HDL, LDLCALC, LDLDIRECT, TRIG, CHOLHDL Lab Results  Component Value Date   WBC 11.6 (H) 03/23/2015   HGB 11.2 (L) 03/23/2015   HCT 33.1 (L) 03/23/2015   MCV 89.7 03/23/2015   PLT 228 03/23/2015   No results found for: IRON, TIBC, FERRITIN  Obesity Behavioral Intervention:   Approximately  15 minutes were spent on the discussion below.  ASK: We discussed the diagnosis of obesity with Annette [name] today and Annette [name] agreed to give [place] permission to discuss obesity behavioral modification therapy today.  ASSESS: Toniesha has the diagnosis of obesity and her BMI today is 34.2. Annette [name] is in the action stage of change.   ADVISE: Annette [name] was educated on the multiple health risks of obesity as well as the benefit of weight loss to improve her health. She was advised of the need for long term treatment and the importance of lifestyle modifications to improve her current health and to decrease her risk of future health problems.  AGREE: Multiple dietary modification options and treatment options were discussed and Annette [name] agreed to follow the recommendations documented in the above note.  ARRANGE: Annette [name] was educated on the importance of frequent visits to treat obesity as outlined per CMS and USPSTF guidelines and agreed to schedule her next follow up appointment today.  Attestation Statements:   Reviewed by clinician on day of visit: allergies, medications, problem list, medical history, surgical history, family history, social history, and previous encounter notes.  Coral Ceo, CMA, am acting as Location manager for [employer], DO.  I have reviewed the above documentation for accuracy and completeness, and I agree with the above. Jearld Lesch, DO

## 2021-01-09 ENCOUNTER — Encounter (INDEPENDENT_AMBULATORY_CARE_PROVIDER_SITE_OTHER): Payer: Self-pay | Admitting: Bariatrics

## 2021-01-09 ENCOUNTER — Other Ambulatory Visit: Payer: Self-pay

## 2021-01-09 ENCOUNTER — Ambulatory Visit (INDEPENDENT_AMBULATORY_CARE_PROVIDER_SITE_OTHER): Payer: PPO | Admitting: Bariatrics

## 2021-01-09 VITALS — BP 120/76 | HR 73 | Temp 97.9°F | Ht 67.0 in | Wt 214.0 lb

## 2021-01-09 DIAGNOSIS — I10 Essential (primary) hypertension: Secondary | ICD-10-CM

## 2021-01-09 DIAGNOSIS — Z6835 Body mass index (BMI) 35.0-35.9, adult: Secondary | ICD-10-CM

## 2021-01-09 DIAGNOSIS — E7800 Pure hypercholesterolemia, unspecified: Secondary | ICD-10-CM | POA: Diagnosis not present

## 2021-01-15 NOTE — Progress Notes (Signed)
Chief Complaint:   OBESITY Annette [name] is here to discuss her progress with her obesity treatment plan along with follow-up of her obesity related diagnoses. Annette [name] is on the Category 1 Plan and states she is following her eating plan approximately 85% of the time. Annette [name] states she is on the treadmill for 20 minutes 6 times per week, and using bands and stretching for 10 minutes 6 times per week.  Today's visit was #: 3 Starting weight: 224 lbs Starting date: 12/11/2020 Today's weight: 214 lbs Today's date: 01/09/2021 Total lbs lost to date: 10 Total lbs lost since last in-office visit: 4  Interim History: Annette [name] is down an additional 4 lbs. She had some friends over for the holiday.  Subjective:   1. Hypercholesterolemia Annette [name] is taking Crestor.  2. Essential hypertension Annette [name] is taking Inderal LA, and her blood pressure is controlled.  Assessment/Plan:   1. Hypercholesterolemia Cardiovascular risk and specific lipid/LDL goals reviewed. We discussed several lifestyle modifications today. Annette [name] will continue Crestor, and will continue to work on diet, exercise and weight loss efforts. Orders and follow up as documented in patient record.   Counseling Intensive lifestyle modifications are the first line treatment for this issue. . Dietary changes: Increase soluble fiber. Decrease simple carbohydrates. . Exercise changes: Moderate to vigorous-intensity aerobic activity 150 minutes per week if tolerated. . Lipid-lowering medications: see documented in medical record.  2. Essential hypertension Annette [name] will continue her medications, and will continue working on healthy weight loss and exercise to improve blood pressure control. We will watch for signs of hypotension as she continues her lifestyle modifications.  3. Obesity, current BMI 33 Annette [name] is currently in the action stage of change. As such, her goal is to continue with weight loss efforts. She has agreed to the Category 1 Plan.    Intentional eating was discussed. Annette [name] will continue to stay strongly adherent to the plan. Recipes II was given today.  Exercise goals: As is.  Behavioral modification strategies: increasing lean protein intake, decreasing simple carbohydrates, increasing vegetables, increasing water intake, decreasing eating out, no skipping meals, meal planning and cooking strategies, keeping healthy foods in the home and planning for success.  Annette [name] has agreed to follow-up with our clinic in 2 weeks. She was informed of the importance of frequent follow-up visits to maximize her success with intensive lifestyle modifications for her multiple health conditions.   Objective:   Blood pressure 120/76, pulse 73, temperature 97.9 F (36.6 C), height 5\' 7"  (1.702 m), weight 214 lb (97.1 kg), SpO2 93 %. Body mass index is 33.52 kg/m.  General: Cooperative, alert, well developed, in no acute distress. HEENT: Conjunctivae and lids unremarkable. Cardiovascular: Regular rhythm.  Lungs: Normal work of breathing. Neurologic: No focal deficits.   Lab Results  Component Value Date   CREATININE 0.81 05/31/2019   BUN 19 05/31/2019   NA 140 05/31/2019   K 4.6 05/31/2019   CL 95 (L) 05/31/2019   CO2 30 (H) 05/31/2019   Lab Results  Component Value Date   ALT 15 06/10/2010   AST 19 06/10/2010   ALKPHOS 92 06/10/2010   BILITOT 0.4 06/10/2010   Lab Results  Component Value Date   HGBA1C 5.8 (H) 12/11/2020   Lab Results  Component Value Date   INSULIN 21.6 12/11/2020   No results found for: TSH No results found for: CHOL, HDL, LDLCALC, LDLDIRECT, TRIG, CHOLHDL Lab Results  Component Value Date   WBC 11.6 (H) 03/23/2015   HGB 11.2 (  L) 03/23/2015   HCT 33.1 (L) 03/23/2015   MCV 89.7 03/23/2015   PLT 228 03/23/2015   No results found for: IRON, TIBC, FERRITIN  Attestation Statements:   Reviewed by clinician on day of visit: allergies, medications, problem list, medical history, surgical  history, family history, social history, and previous encounter notes.   Wilhemena Durie, am acting as Location manager for [employer], DO.  I have reviewed the above documentation for accuracy and completeness, and I agree with the above. Jearld Lesch, DO

## 2021-01-16 ENCOUNTER — Encounter (INDEPENDENT_AMBULATORY_CARE_PROVIDER_SITE_OTHER): Payer: Self-pay | Admitting: Bariatrics

## 2021-01-30 ENCOUNTER — Ambulatory Visit (INDEPENDENT_AMBULATORY_CARE_PROVIDER_SITE_OTHER): Payer: PPO | Admitting: Bariatrics

## 2021-02-04 DIAGNOSIS — L308 Other specified dermatitis: Secondary | ICD-10-CM | POA: Diagnosis not present

## 2021-02-04 DIAGNOSIS — L820 Inflamed seborrheic keratosis: Secondary | ICD-10-CM | POA: Diagnosis not present

## 2021-02-04 DIAGNOSIS — L570 Actinic keratosis: Secondary | ICD-10-CM | POA: Diagnosis not present

## 2021-02-04 DIAGNOSIS — L718 Other rosacea: Secondary | ICD-10-CM | POA: Diagnosis not present

## 2021-02-05 ENCOUNTER — Ambulatory Visit: Payer: PPO | Admitting: Acute Care

## 2021-02-05 ENCOUNTER — Other Ambulatory Visit: Payer: Self-pay

## 2021-02-05 ENCOUNTER — Encounter: Payer: Self-pay | Admitting: Acute Care

## 2021-02-05 VITALS — BP 116/70 | HR 80 | Temp 98.4°F | Ht 67.0 in | Wt 214.6 lb

## 2021-02-05 DIAGNOSIS — Z8616 Personal history of COVID-19: Secondary | ICD-10-CM | POA: Diagnosis not present

## 2021-02-05 DIAGNOSIS — J449 Chronic obstructive pulmonary disease, unspecified: Secondary | ICD-10-CM

## 2021-02-05 DIAGNOSIS — Z6833 Body mass index (BMI) 33.0-33.9, adult: Secondary | ICD-10-CM

## 2021-02-05 DIAGNOSIS — Z720 Tobacco use: Secondary | ICD-10-CM

## 2021-02-05 NOTE — Progress Notes (Signed)
History of Present Illness Annette [name] is a 70 y.o. female former smoker ( 38 pack year smoking history, quit 2010) with COPD a history of CAD.She is followed by Dr. Lamonte Sakai. Maintenance : Serevent  Rescue : [facility] Lung Cancer Screening Due 04/2021 Zyrtec for allergies as needed XLear and Flonase for sinus issues as needed Nasal Irrigations as needed Mucinex and Flutter Valve as needed for chest congestion Covid 19 diagnosis>> 08/2020>> Not hospitalized, but in bed x 3 weeks.  Recovered but post viral fatigue. She is followed by [facility]. She has lost 10-12 pounds.      02/05/2021 Pt. Presents for 3 month follow up. She states she has been doing well. She has been compliant with her Serevent. She has noted that the heat and humidity makes her breathing worse .Rescue use has been about 3 times only in the last 3 months. She is walking on her treadmill. She is monitoring her oxygen sats. She states he does a lot or pursed lip breathing, which helps her maintain her sats. She has not required as much Mucinex. She is compliant with her Angus Seller and Flonase for sinus issues . She is compliant with her Zyrtec for allergies. She has not had to use nasal irrigations. She is using her flutter valve as needed.  Post viral fatigue and headaches have resolved.  She is being followed by [facility] and she has lost 10-12 pounds. She is also getting Vitamin D supplementation as her Vitamin D levels were low. They also adjusted her thyroid medication.  She has had a very stable interval.   Test Results: 04/2020 Lung Cancer Screening IMPRESSION: Lung-RADS 2, benign appearance or behavior. Continue annual screening with low-dose chest CT without contrast in 12 months  CBC Latest Ref Rng & Units 03/23/2015 03/15/2015 06/01/2013  WBC 4.0 - 10.5 K/uL 11.6(H) 7.4 6.3  Hemoglobin 12.0 - 15.0 g/dL 11.2(L) 14.2 13.8  Hematocrit 36.0 - 46.0 % 33.1(L) 42.4 40.6  Platelets 150 -  400 K/uL 228 332 293.0    BMP Latest Ref Rng & Units 05/31/2019 01/28/2018 01/18/2018  Glucose 65 - 99 mg/dL 98 96 87  BUN 8 - 27 mg/dL 19 20 16   Creatinine 0.57 - 1.00 mg/dL 0.81 0.81 0.88  BUN/Creat Ratio 12 - 28 23 25 18   Sodium 134 - 144 mmol/L 140 139 139  Potassium 3.5 - 5.2 mmol/L 4.6 5.2 5.5(H)  Chloride 96 - 106 mmol/L 95(L) 96 97  CO2 20 - 29 mmol/L 30(H) 28 27  Calcium 8.7 - 10.3 mg/dL 9.8 9.9 9.8    BNP    Component Value Date/Time   BNP 17.4 05/31/2019 1402    ProBNP No results found for: PROBNP  PFT    Component Value Date/Time   FEV1PRE 1.51 10/10/2019 1622   FEV1POST 1.46 10/10/2019 1622   FVCPRE 2.27 10/10/2019 1622   FVCPOST 2.20 10/10/2019 1622   TLC 5.05 10/10/2019 1622   DLCOUNC 18.30 10/10/2019 1622   PREFEV1FVCRT 66 10/10/2019 1622   PSTFEV1FVCRT 67 10/10/2019 1622    No results found.   Past medical hx Past Medical History:  Diagnosis Date   Arthritis    Right hip, end stage   Back pain    COPD (chronic obstructive pulmonary disease) (HCC)    Coronary artery calcification seen on computed tomography 02/2018   Coronary calcium score 79.  Coronary CT angiogram: Mild CAD and proximal LAD, proximal RCA and mid LCx.  Moderate plaque  ostial LCx.  Mildly dilated pulmonary artery, suggestive of possible pulmonary hypertension   Difficulty sleeping    DUE TO PAIN   Edema of both lower extremities    Emphysema of lung (HCC)    Fluid retention    TAKES HCTZ   GERD (gastroesophageal reflux disease)    Hemorrhoids    Hyperlipidemia    Hypertension    Hypothyroidism    IBS (irritable bowel syndrome)    Irritable bowel syndrome    Joint pain    Macular degeneration    Osteopenia    Shingles 2004   SOB (shortness of breath)    Tremors of nervous system    TAKES PROPRANOLOL TO TX   Varicose veins    Vitamin D deficiency      Social History   Tobacco Use   Smoking status: Former    Packs/day: 1.00    Years: 38.00    Pack years: 38.00     Types: Cigarettes    Quit date: 03/14/2009    Years since quitting: 11.9   Smokeless tobacco: Never  Substance Use Topics   Alcohol use: Yes    Comment: beer every 6-8 months   Drug use: No    Annette [name] reports that she quit smoking about 11 years ago. She has a 38.00 pack-year smoking history. She has never used smokeless tobacco. She reports current alcohol use. She reports that she does not use drugs.  Tobacco Cessation: Counseling given: Not Answered   Past surgical hx, Family hx, Social hx all reviewed.  Current Outpatient Medications on File Prior to Visit  Medication Sig   albuterol (VENTOLIN HFA) 108 (90 Base) MCG/ACT inhaler Inhale 2 puffs into the lungs every 6 (six) hours as needed for wheezing or shortness of breath.   aspirin EC 81 MG tablet Take 81 mg by mouth daily.   Cholecalciferol (KP VITAMIN D3) 50 MCG (2000 UT) CAPS Take by mouth.   Coenzyme Q10 (CO Q-10) 300 MG CAPS 1 capsule   levothyroxine (SYNTHROID) 125 MCG tablet Take 1 tablet by mouth every morning on an empty stomach.   Magnesium 250 MG TABS Take by mouth.   propranolol ER (INDERAL LA) 60 MG 24 hr capsule Take 1 capsule by mouth once daily.   rosuvastatin (CRESTOR) 5 MG tablet Take 1 tablet by mouth 4 days per week   salmeterol (SEREVENT DISKUS) 50 MCG/DOSE diskus inhaler Inhale 1 puff into the lungs in the morning and at bedtime.   Naproxen Sodium (ALEVE PO) Take 400 mg by mouth as directed. (Patient not taking: Reported on 02/05/2021)   No current facility-administered medications on file prior to visit.     Allergies  Allergen Reactions   Augmentin [Amoxicillin-Pot Clavulanate] Nausea And Vomiting   Codeine Other (See Comments)    Causes her to stay awake    Other Hives and Other (See Comments)    IV contrast    Iodinated Diagnostic Agents Rash    Review Of Systems:  Constitutional:   No  weight loss, night sweats,  Fevers, chills, fatigue, or  lassitude.  HEENT:   No headaches,   Difficulty swallowing,  Tooth/dental problems, or  Sore throat,                No sneezing, itching, ear ache, nasal congestion, post nasal drip,   CV:  No chest pain,  Orthopnea, PND, swelling in lower extremities, anasarca, dizziness, palpitations, syncope.   GI  No heartburn, indigestion, abdominal pain, nausea, vomiting,  diarrhea, change in bowel habits, loss of appetite, bloody stools.   Resp: Occasional  shortness of breath with exertion not  at rest.  + excess mucus, no productive cough,  No non-productive cough,  No coughing up of blood.  No change in color of mucus.  Rare wheezing.  No chest wall deformity  Skin: no rash or lesions.  GU: no dysuria, change in color of urine, no urgency or frequency.  No flank pain, no hematuria   MS:  No joint pain or swelling.  No decreased range of motion.  No back pain.  Psych:  No change in mood or affect. No depression or anxiety.  No memory loss.   Vital Signs BP 116/70 (BP Location: Right Arm, Patient Position: Sitting, Cuff Size: Large)   Pulse 80   Temp 98.4 F (36.9 C) (Oral)   Ht 5\' 7"  (1.702 m)   Wt 214 lb 9.6 oz (97.3 kg)   SpO2 94%   BMI 33.61 kg/m    Physical Exam:  General- No distress,  A&Ox3, pleasant ENT: No sinus tenderness, TM clear, pale nasal mucosa, no oral exudate,no post nasal drip, no LAN Cardiac: S1, S2, regular rate and rhythm, no murmur Chest: No wheeze/ rales/ dullness; no accessory muscle use, no nasal flaring, no sternal retractions Abd.: Soft Non-tender, ND, BS + Ext: No clubbing cyanosis, edema Neuro:  normal strength, MAE x 4, A&O x 3 Skin: No rashes, warm and dry Psych: normal mood and behavior   Assessment/Plan COPD and Dyspnea Stable interval Plan Continue Symbicort 2 puffs twice daily Rinse mouth after use. Continue Rescue inhaler  As needed for breakthrough shortness of breath. Continue Zyrtec at bedtime, daily when pollen levels are high. Continue to work on increasing your  activity. Let me know when you are ready for [place] to re-refer to Pulmonary Rehab Use Mucinex as needed for chest congestion. Work on weight loss. Call [facility] directly to schedule referral >> [phone]  We will refer you to [facility] for weight loss. Continue Flonase and X LEAR nasal sprays. Consider Neti Pot nasal irrigations Use flutter valve and Mucinex as needed for chest congestion. Lung Cancer Screening due  04/2021 ( Screening program will order).Please message Langley Gauss Please have Ronalee Belts call patient to schedule 04/2022 Low Dose CT.  Follow up in 6 months with Dr. Lamonte Sakai, or Judson Roch NP Please contact office for sooner follow up if symptoms do not improve or worsen or seek emergency care    Covid 08/2020 Did not require hospitalization, but was in bed x 3 weeks Vaccinated Residual headache and sinus issues, and fatigue Resolved since last visit Plan Monitor    Body mass index is 34.6 kg/m. >> 10/2020 Body mass index is 33.61 kg/m. >> 01/2021 Has lost 10-12 pounds since referral Low Vitamin D level has been treated Thyroid medication has been adjusted Plan Continue to follow up with [facility].  Keep up the great work   Tobacco Abuse 38 pack year smoking history Quit 2010 Plan Counseling to remain smoke free Follow up LDCT 04/2021 We will have Rodena Piety call to schedule CT Chest 04/2021  I spent 30 minutes dedicated to the care of this patient on the date of this encounter to include pre-visit review of records, face-to-face time with the patient discussing conditions above, post visit ordering of testing, clinical documentation with the electronic health record, making appropriate referrals as documented, and communicating necessary information to the patient's healthcare team.  Magdalen Spatz, NP 02/05/2021  2:44 PM

## 2021-02-05 NOTE — Patient Instructions (Addendum)
It is good to see you today. Continue Serevent 2 puffs twice daily Rinse mouth after use. Continue Rescue inhaler  As needed for breakthrough shortness of breath. Continue Zyrtec at bedtime, daily when pollen levels are high. Continue to work on increasing your activity. Use Mucinex and flutter valve as needed for chest congestion. Work on weight loss. Continue with [facility]!  Continue Flonase and X LEAR nasal sprays. Consider Neti Pot nasal irrigations Use flutter valve and Mucinex as needed for chest congestion. Follow up with Dr. Lamonte Sakai or Judson Roch NP in 6 months. Call if you need [place] sooner Lung Cancer Screening due  04/2021 ( Screening program will order).Please message Langley Gauss Please have Ronalee Belts call patient to schedule 04/2022 Low Dose CT.  Please contact office for sooner follow up if symptoms do not improve or worsen or seek emergency care

## 2021-02-18 ENCOUNTER — Other Ambulatory Visit (HOSPITAL_COMMUNITY): Payer: Self-pay

## 2021-02-18 MED ORDER — HYDROCHLOROTHIAZIDE 25 MG PO TABS
25.0000 mg | ORAL_TABLET | Freq: Every morning | ORAL | 2 refills | Status: DC
  Filled 2021-02-18: qty 90, 90d supply, fill #0
  Filled 2021-05-26: qty 90, 90d supply, fill #1
  Filled 2021-08-17: qty 90, 90d supply, fill #2

## 2021-02-19 ENCOUNTER — Other Ambulatory Visit (HOSPITAL_COMMUNITY): Payer: Self-pay

## 2021-02-20 ENCOUNTER — Other Ambulatory Visit: Payer: Self-pay

## 2021-02-20 ENCOUNTER — Other Ambulatory Visit (HOSPITAL_COMMUNITY): Payer: Self-pay

## 2021-02-20 ENCOUNTER — Ambulatory Visit (INDEPENDENT_AMBULATORY_CARE_PROVIDER_SITE_OTHER): Payer: PPO | Admitting: Bariatrics

## 2021-02-20 ENCOUNTER — Encounter (INDEPENDENT_AMBULATORY_CARE_PROVIDER_SITE_OTHER): Payer: Self-pay | Admitting: Bariatrics

## 2021-02-20 VITALS — BP 112/76 | HR 70 | Temp 98.4°F | Ht 67.0 in | Wt 210.0 lb

## 2021-02-20 DIAGNOSIS — Z6835 Body mass index (BMI) 35.0-35.9, adult: Secondary | ICD-10-CM

## 2021-02-20 DIAGNOSIS — I10 Essential (primary) hypertension: Secondary | ICD-10-CM | POA: Diagnosis not present

## 2021-02-20 DIAGNOSIS — R7303 Prediabetes: Secondary | ICD-10-CM | POA: Diagnosis not present

## 2021-02-24 DIAGNOSIS — E039 Hypothyroidism, unspecified: Secondary | ICD-10-CM | POA: Diagnosis not present

## 2021-02-25 NOTE — Progress Notes (Signed)
Chief Complaint:   OBESITY Emrys is here to discuss her progress with her obesity treatment plan along with follow-up of her obesity related diagnoses. Gracelynne is on the Category 1 Plan and states she is following her eating plan approximately 45% of the time. Denita states she is doing 0 minutes 0 times per week.  Today's visit was #: 4 Starting weight: 224 lbs Starting date: 12/11/2020 Today's weight: 210 lbs Today's date: 02/20/2021 Total lbs lost to date: 14 lbs Total lbs lost since last in-office visit: 4 lbs  Interim History: Khaliya is down an additional 4 lbs and doing well overall.  She was off the plan.  She is doing well with her water intake, but she struggles with protein.  Subjective:   1. Essential hypertension Annia is taking her medications and her hypertension is controlled.  2. Prediabetes Katria is not on any medications.  Assessment/Plan:   1. Essential hypertension Manali will continue her medications, and will continue working on healthy weight loss and exercise to improve blood pressure control. We will watch for signs of hypotension as she continues her lifestyle modifications.  2. Prediabetes Chauncey will continue exercise and activity (large muscle group, no pounding exercises), and decreasing simple carbohydrates to help decrease the risk of diabetes.   3. Obesity, current BMI 58 Pearle is currently in the action stage of change. As such, her goal is to continue with weight loss efforts. She has agreed to the Category 1 Plan.   Amrie will continue meal planning. She will continue intentional eating and increase protein.  Exercise goals:  Continue on treadmill and will do more.  Behavioral modification strategies: increasing lean protein intake, decreasing simple carbohydrates, increasing vegetables, increasing water intake, decreasing eating out, no skipping meals, meal planning and cooking strategies, keeping healthy foods in the home, and planning for  success.  Carolan has agreed to follow-up with our clinic in 2 weeks. She was informed of the importance of frequent follow-up visits to maximize her success with intensive lifestyle modifications for her multiple health conditions.   Objective:   Blood pressure 112/76, pulse 70, temperature 98.4 F (36.9 C), height 5\' 7"  (1.702 m), weight 210 lb (95.3 kg), SpO2 92 %. Body mass index is 32.89 kg/m.  General: Cooperative, alert, well developed, in no acute distress. HEENT: Conjunctivae and lids unremarkable. Cardiovascular: Regular rhythm.  Lungs: Normal work of breathing. Neurologic: No focal deficits.   Lab Results  Component Value Date   CREATININE 0.81 05/31/2019   BUN 19 05/31/2019   NA 140 05/31/2019   K 4.6 05/31/2019   CL 95 (L) 05/31/2019   CO2 30 (H) 05/31/2019   Lab Results  Component Value Date   ALT 15 06/10/2010   AST 19 06/10/2010   ALKPHOS 92 06/10/2010   BILITOT 0.4 06/10/2010   Lab Results  Component Value Date   HGBA1C 5.8 (H) 12/11/2020   Lab Results  Component Value Date   INSULIN 21.6 12/11/2020   No results found for: TSH No results found for: CHOL, HDL, LDLCALC, LDLDIRECT, TRIG, CHOLHDL Lab Results  Component Value Date   VD25OH 27.3 (L) 12/11/2020   Lab Results  Component Value Date   WBC 11.6 (H) 03/23/2015   HGB 11.2 (L) 03/23/2015   HCT 33.1 (L) 03/23/2015   MCV 89.7 03/23/2015   PLT 228 03/23/2015   No results found for: IRON, TIBC, FERRITIN  Obesity Behavioral Intervention:   Approximately 15 minutes were spent on the discussion  below.  ASK: We discussed the diagnosis of obesity with Modest today and Deysi agreed to give [place] permission to discuss obesity behavioral modification therapy today.  ASSESS: Carleah has the diagnosis of obesity and her BMI today is 33.0. Atiyana is in the action stage of change.   ADVISE: Julee was educated on the multiple health risks of obesity as well as the benefit of weight loss to improve her  health. She was advised of the need for long term treatment and the importance of lifestyle modifications to improve her current health and to decrease her risk of future health problems.  AGREE: Multiple dietary modification options and treatment options were discussed and Tawonda agreed to follow the recommendations documented in the above note.  ARRANGE: Kharisma was educated on the importance of frequent visits to treat obesity as outlined per CMS and USPSTF guidelines and agreed to schedule her next follow up appointment today.  Attestation Statements:   Reviewed by clinician on day of visit: allergies, medications, problem list, medical history, surgical history, family history, social history, and previous encounter notes.  I, Lizbeth Bark, RMA, am acting as Location manager for [employer], DO.   I have reviewed the above documentation for accuracy and completeness, and I agree with the above. Jearld Lesch, DO

## 2021-02-26 ENCOUNTER — Encounter (INDEPENDENT_AMBULATORY_CARE_PROVIDER_SITE_OTHER): Payer: Self-pay | Admitting: Bariatrics

## 2021-02-28 ENCOUNTER — Other Ambulatory Visit (HOSPITAL_COMMUNITY): Payer: Self-pay

## 2021-02-28 DIAGNOSIS — H524 Presbyopia: Secondary | ICD-10-CM | POA: Diagnosis not present

## 2021-02-28 DIAGNOSIS — H26491 Other secondary cataract, right eye: Secondary | ICD-10-CM | POA: Diagnosis not present

## 2021-02-28 DIAGNOSIS — Z961 Presence of intraocular lens: Secondary | ICD-10-CM | POA: Diagnosis not present

## 2021-02-28 DIAGNOSIS — H353132 Nonexudative age-related macular degeneration, bilateral, intermediate dry stage: Secondary | ICD-10-CM | POA: Diagnosis not present

## 2021-02-28 DIAGNOSIS — H04123 Dry eye syndrome of bilateral lacrimal glands: Secondary | ICD-10-CM | POA: Diagnosis not present

## 2021-02-28 MED ORDER — CYCLOSPORINE 0.05 % OP EMUL
OPHTHALMIC | 3 refills | Status: DC
  Filled 2021-02-28: qty 180, 90d supply, fill #0

## 2021-03-03 ENCOUNTER — Other Ambulatory Visit (HOSPITAL_COMMUNITY): Payer: Self-pay

## 2021-03-05 ENCOUNTER — Other Ambulatory Visit: Payer: Self-pay

## 2021-03-05 ENCOUNTER — Ambulatory Visit (INDEPENDENT_AMBULATORY_CARE_PROVIDER_SITE_OTHER): Payer: PPO | Admitting: Bariatrics

## 2021-03-05 VITALS — BP 105/60 | HR 69 | Temp 98.3°F | Ht 67.0 in | Wt 208.0 lb

## 2021-03-05 DIAGNOSIS — E038 Other specified hypothyroidism: Secondary | ICD-10-CM | POA: Diagnosis not present

## 2021-03-05 DIAGNOSIS — Z6835 Body mass index (BMI) 35.0-35.9, adult: Secondary | ICD-10-CM

## 2021-03-05 DIAGNOSIS — E559 Vitamin D deficiency, unspecified: Secondary | ICD-10-CM

## 2021-03-10 ENCOUNTER — Other Ambulatory Visit (HOSPITAL_COMMUNITY): Payer: Self-pay

## 2021-03-12 NOTE — Progress Notes (Signed)
Chief Complaint:   OBESITY Leeyana is here to discuss her progress with her obesity treatment plan along with follow-up of her obesity related diagnoses. Kashanda is on the Category 1 Plan and states she is following her eating plan approximately 80-85% of the time. Tamorah states she is walking on treadmill for 20-30 minutes 5-65 times per week.  Today's visit was #: 5 Starting weight: 224 lbs Starting date: 12/11/2020 Today's weight: 208 lbs Today's date: 03/05/2021 Total lbs lost to date: 16 lbs Total lbs lost since last in-office visit: 2 lbs  Interim History: Riham is down an additional 2 lbs. She is drinking adequate water. She is doing better with her protein.  Subjective:   1. Vitamin D insufficiency Ajwa is taking Vitamin D OTC.  2. Other specified hypothyroidism Kiely is taking Synthroid.   Assessment/Plan:   1. Vitamin D insufficiency Low Vitamin D level contributes to fatigue and are associated with obesity, breast, and colon cancer. Jashanti agrees to continue to take prescription Vitamin D 50,000 IU every week and will follow-up for routine testing of Vitamin D, at least 2-3 times per year to avoid over-replacement. She will get some limited sun exposure.    2. Other specified hypothyroidism Patient with long-standing hypothyroidism, on levothyroxine therapy. Tamia will continue taking Synthroid. jShe appears euthyroid. Orders and follow up as documented in patient record.  Counseling Good thyroid control is important for overall health. Supratherapeutic thyroid levels are dangerous and will not improve weight loss results. The correct way to take levothyroxine is fasting, with water, separated by at least 30 minutes from breakfast, and separated by more than 4 hours from calcium, iron, multivitamins, acid reflux medications (PPIs).    3. Obesity, current BMI 32.6 Deisy is currently in the action stage of change. As such, her goal is to continue with weight loss  efforts. She has agreed to the Category 1 Plan.   Mitra will continue to meal plan. She will be mindful with intentional eating.  Exercise goals:  Treadmill as above.  Behavioral modification strategies: increasing lean protein intake, decreasing simple carbohydrates, increasing vegetables, increasing water intake, decreasing eating out, no skipping meals, meal planning and cooking strategies, keeping healthy foods in the home, and planning for success.  Merritt has agreed to follow-up with our clinic in 3 weeks. She was informed of the importance of frequent follow-up visits to maximize her success with intensive lifestyle modifications for her multiple health conditions.   Objective:   Blood pressure 105/60, pulse 69, temperature 98.3 F (36.8 C), height '5\' 7"'$  (1.702 m), weight 208 lb (94.3 kg), SpO2 94 %. Body mass index is 32.58 kg/m.  General: Cooperative, alert, well developed, in no acute distress. HEENT: Conjunctivae and lids unremarkable. Cardiovascular: Regular rhythm.  Lungs: Normal work of breathing. Neurologic: No focal deficits.   Lab Results  Component Value Date   CREATININE 0.81 05/31/2019   BUN 19 05/31/2019   NA 140 05/31/2019   K 4.6 05/31/2019   CL 95 (L) 05/31/2019   CO2 30 (H) 05/31/2019   Lab Results  Component Value Date   ALT 15 06/10/2010   AST 19 06/10/2010   ALKPHOS 92 06/10/2010   BILITOT 0.4 06/10/2010   Lab Results  Component Value Date   HGBA1C 5.8 (H) 12/11/2020   Lab Results  Component Value Date   INSULIN 21.6 12/11/2020   No results found for: TSH No results found for: CHOL, HDL, LDLCALC, LDLDIRECT, TRIG, CHOLHDL Lab Results  Component Value Date   VD25OH 27.3 (L) 12/11/2020   Lab Results  Component Value Date   WBC 11.6 (H) 03/23/2015   HGB 11.2 (L) 03/23/2015   HCT 33.1 (L) 03/23/2015   MCV 89.7 03/23/2015   PLT 228 03/23/2015   No results found for: IRON, TIBC, FERRITIN  Obesity Behavioral Intervention:    Approximately 15 minutes were spent on the discussion below.  ASK: We discussed the diagnosis of obesity with Raeanne today and Ahmiya agreed to give [place] permission to discuss obesity behavioral modification therapy today.  ASSESS: Otelia has the diagnosis of obesity and her BMI today is 32.6. Zanaya is in the action stage of change.   ADVISE: Fleda was educated on the multiple health risks of obesity as well as the benefit of weight loss to improve her health. She was advised of the need for long term treatment and the importance of lifestyle modifications to improve her current health and to decrease her risk of future health problems.  AGREE: Multiple dietary modification options and treatment options were discussed and Shams agreed to follow the recommendations documented in the above note.  ARRANGE: Nevah was educated on the importance of frequent visits to treat obesity as outlined per CMS and USPSTF guidelines and agreed to schedule her next follow up appointment today.  Attestation Statements:   Reviewed by clinician on day of visit: allergies, medications, problem list, medical history, surgical history, family history, social history, and previous encounter notes.  I, Lizbeth Bark, RMA, am acting as Location manager for [employer], DO.   I have reviewed the above documentation for accuracy and completeness, and I agree with the above. Jearld Lesch, DO

## 2021-03-13 ENCOUNTER — Encounter (INDEPENDENT_AMBULATORY_CARE_PROVIDER_SITE_OTHER): Payer: Self-pay | Admitting: Bariatrics

## 2021-03-26 ENCOUNTER — Other Ambulatory Visit: Payer: Self-pay

## 2021-03-26 ENCOUNTER — Ambulatory Visit (INDEPENDENT_AMBULATORY_CARE_PROVIDER_SITE_OTHER): Payer: PPO | Admitting: Bariatrics

## 2021-03-26 ENCOUNTER — Encounter (INDEPENDENT_AMBULATORY_CARE_PROVIDER_SITE_OTHER): Payer: Self-pay | Admitting: Bariatrics

## 2021-03-26 VITALS — BP 103/66 | HR 64 | Temp 97.8°F | Ht 67.0 in | Wt 206.0 lb

## 2021-03-26 DIAGNOSIS — E7800 Pure hypercholesterolemia, unspecified: Secondary | ICD-10-CM | POA: Diagnosis not present

## 2021-03-26 DIAGNOSIS — Z6835 Body mass index (BMI) 35.0-35.9, adult: Secondary | ICD-10-CM

## 2021-03-26 DIAGNOSIS — R7303 Prediabetes: Secondary | ICD-10-CM | POA: Diagnosis not present

## 2021-03-26 NOTE — Progress Notes (Signed)
Chief Complaint:   OBESITY Annette [name] is here to discuss her progress with her obesity treatment plan along with follow-up of her obesity related diagnoses. Annette [name] is on the Category 1 Plan and states she is following her eating plan approximately 50% of the time. Annette [name] states she is on the treadmill and dancing for 20-25 minutes 2 times per week.  Today's visit was #: 6 Starting weight: 224 lbs Starting date: 12/11/2020 Today's weight: 206 lbs Today's date: 03/26/2021 Total lbs lost to date: 18 Total lbs lost since last in-office visit: 2  Interim History: Annette [name] is down 2 lbs since her last visit. She had company for 2 weeks. She is doing well with her water intake.  Subjective:   1. Hypercholesterolemia Annette [name] is taking Crestor currently.  2. Pre-diabetes Annette [name] is not on medications currently.  Assessment/Plan:   1. Hypercholesterolemia Cardiovascular risk and specific lipid/LDL goals reviewed. We discussed several lifestyle modifications today. Annette [name] will continue Crestor, and will continue to work on diet, exercise and weight loss efforts. Orders and follow up as documented in patient record.   Counseling Intensive lifestyle modifications are the first line treatment for this issue. Dietary changes: Increase soluble fiber. Decrease simple carbohydrates. Exercise changes: Moderate to vigorous-intensity aerobic activity 150 minutes per week if tolerated. Lipid-lowering medications: see documented in medical record.  2. Pre-diabetes Annette [name] will continue to work on the meal plan, activities/exercise, and decreasing simple carbohydrates to help decrease the risk of diabetes.   3. Obesity, current BMI 32.4 Annette [name] is currently in the action stage of change. As such, her goal is to continue with weight loss efforts. She has agreed to the Category 1 Plan.   Intentional eating was discussed, and she is to increase protein for dinner.  Exercise goals: As is.  Behavioral modification  strategies: increasing lean protein intake, decreasing simple carbohydrates, increasing vegetables, increasing water intake, decreasing eating out, no skipping meals, meal planning and cooking strategies, keeping healthy foods in the home, and planning for success.  Annette [name] has agreed to follow-up with our clinic in 2 to 3 weeks. She was informed of the importance of frequent follow-up visits to maximize her success with intensive lifestyle modifications for her multiple health conditions.   Objective:   Blood pressure 103/66, pulse 64, temperature 97.8 F (36.6 C), height '5\' 7"'$  (1.702 m), weight 206 lb (93.4 kg), SpO2 94 %. Body mass index is 32.26 kg/m.  General: Cooperative, alert, well developed, in no acute distress. HEENT: Conjunctivae and lids unremarkable. Cardiovascular: Regular rhythm.  Lungs: Normal work of breathing. Neurologic: No focal deficits.   Lab Results  Component Value Date   CREATININE 0.81 05/31/2019   BUN 19 05/31/2019   NA 140 05/31/2019   K 4.6 05/31/2019   CL 95 (L) 05/31/2019   CO2 30 (H) 05/31/2019   Lab Results  Component Value Date   ALT 15 06/10/2010   AST 19 06/10/2010   ALKPHOS 92 06/10/2010   BILITOT 0.4 06/10/2010   Lab Results  Component Value Date   HGBA1C 5.8 (H) 12/11/2020   Lab Results  Component Value Date   INSULIN 21.6 12/11/2020   No results found for: TSH No results found for: CHOL, HDL, LDLCALC, LDLDIRECT, TRIG, CHOLHDL Lab Results  Component Value Date   VD25OH 27.3 (L) 12/11/2020   Lab Results  Component Value Date   WBC 11.6 (H) 03/23/2015   HGB 11.2 (L) 03/23/2015   HCT 33.1 (L) 03/23/2015   MCV 89.7 03/23/2015  PLT 228 03/23/2015   No results found for: IRON, TIBC, FERRITIN  Obesity Behavioral Intervention:   Approximately 15 minutes were spent on the discussion below.  ASK: We discussed the diagnosis of obesity with Blaike today and Sania agreed to give [place] permission to discuss obesity behavioral  modification therapy today.  ASSESS: Mykia has the diagnosis of obesity and her BMI today is 32.26. Pennee is in the action stage of change.   ADVISE: Megumi was educated on the multiple health risks of obesity as well as the benefit of weight loss to improve her health. She was advised of the need for long term treatment and the importance of lifestyle modifications to improve her current health and to decrease her risk of future health problems.  AGREE: Multiple dietary modification options and treatment options were discussed and Edeline agreed to follow the recommendations documented in the above note.  ARRANGE: Monick was educated on the importance of frequent visits to treat obesity as outlined per CMS and USPSTF guidelines and agreed to schedule her next follow up appointment today.  Attestation Statements:   Reviewed by clinician on day of visit: allergies, medications, problem list, medical history, surgical history, family history, social history, and previous encounter notes.   Wilhemena Durie, am acting as Location manager for [employer], DO.  I have reviewed the above documentation for accuracy and completeness, and I agree with the above. Jearld Lesch, DO

## 2021-03-27 ENCOUNTER — Encounter (INDEPENDENT_AMBULATORY_CARE_PROVIDER_SITE_OTHER): Payer: Self-pay | Admitting: Bariatrics

## 2021-03-28 DIAGNOSIS — L570 Actinic keratosis: Secondary | ICD-10-CM | POA: Diagnosis not present

## 2021-03-28 DIAGNOSIS — L820 Inflamed seborrheic keratosis: Secondary | ICD-10-CM | POA: Diagnosis not present

## 2021-03-31 NOTE — Telephone Encounter (Signed)
I have already spoke with Mrs. Slimp and her LCS CT has been scheduled on 05/14/21 @ 8:30am at [place]

## 2021-04-07 ENCOUNTER — Other Ambulatory Visit (HOSPITAL_COMMUNITY): Payer: Self-pay

## 2021-04-07 MED ORDER — ROSUVASTATIN CALCIUM 5 MG PO TABS
ORAL_TABLET | ORAL | 1 refills | Status: DC
  Filled 2021-04-07: qty 48, 84d supply, fill #0

## 2021-04-08 ENCOUNTER — Other Ambulatory Visit (HOSPITAL_COMMUNITY): Payer: Self-pay

## 2021-04-08 MED ORDER — ROSUVASTATIN CALCIUM 5 MG PO TABS
ORAL_TABLET | ORAL | 2 refills | Status: DC
  Filled 2021-04-08 – 2021-04-09 (×2): qty 90, 90d supply, fill #0
  Filled 2021-07-14: qty 90, 90d supply, fill #1
  Filled 2021-10-20: qty 90, 90d supply, fill #2

## 2021-04-09 ENCOUNTER — Other Ambulatory Visit (HOSPITAL_COMMUNITY): Payer: Self-pay

## 2021-04-23 ENCOUNTER — Other Ambulatory Visit: Payer: Self-pay

## 2021-04-23 ENCOUNTER — Ambulatory Visit (INDEPENDENT_AMBULATORY_CARE_PROVIDER_SITE_OTHER): Payer: PPO | Admitting: Bariatrics

## 2021-04-23 ENCOUNTER — Encounter (INDEPENDENT_AMBULATORY_CARE_PROVIDER_SITE_OTHER): Payer: Self-pay | Admitting: Bariatrics

## 2021-04-23 VITALS — BP 111/57 | HR 61 | Temp 98.0°F | Ht 67.0 in | Wt 204.0 lb

## 2021-04-23 DIAGNOSIS — E559 Vitamin D deficiency, unspecified: Secondary | ICD-10-CM

## 2021-04-23 DIAGNOSIS — R7303 Prediabetes: Secondary | ICD-10-CM

## 2021-04-23 DIAGNOSIS — Z6835 Body mass index (BMI) 35.0-35.9, adult: Secondary | ICD-10-CM | POA: Diagnosis not present

## 2021-04-23 DIAGNOSIS — E7800 Pure hypercholesterolemia, unspecified: Secondary | ICD-10-CM

## 2021-04-23 NOTE — Progress Notes (Signed)
Chief Complaint:   OBESITY Annette [name] is here to discuss her progress with her obesity treatment plan along with follow-up of her obesity related diagnoses. Annette [name] is on the Category 1 Plan and states she is following her eating plan approximately 40% of the time. Annette [name] states she is walking for 30 minutes 4 times per week.  Today's visit was #: 7 Starting weight: 224 lbs Starting date: 12/11/2020 Today's weight: 204 lbs Today's date: 04/23/2021 Total lbs lost to date: 20 lbs Total lbs lost since last in-office visit: 2 lbs  Interim History: Annette [name] is down an additional 2 lbs. She has not been eating from the plan that much.  Subjective:   1. Pre-diabetes Melis is currently not on medications. She has a diagnosis of prediabetes based on her elevated HgA1c and was informed this puts her at greater risk of developing diabetes. She continues to work on diet and exercise to decrease her risk of diabetes.   2. Hypercholesterolemia Annette [name] is currently taking Crestor.  3. Vitamin D insufficiency Annette [name] is currently taking Vitamin D OTC 2,000.  Assessment/Plan:   1. Pre-diabetes Annette [name] will continue to work on weight loss, exercise, and decreasing simple carbohydrates to help decrease the risk of diabetes. She will continue to walk and stay on plan.   Che - Insulin, random - Hemoglobin A1c  2. Hypercholesterolemia Annette [name] will continue taking Crestor. She will increase Omega 3 to Omega 6 ratio. We will check labs today.  - Lipid Panel With LDL/HDL Ratio  - Comprehensive metabolic panel  3. Vitamin D insufficiency Low Vitamin D level contributes to fatigue and are associated with obesity, breast, and colon cancer. Annette [name] agrees to continue OTC Vitamin D 2,000 IU every week and will follow-up for routine testing of Vitamin D, at least 2-3 times per year to avoid over-replacement. We will check labs today.  - VITAMIN D 25 Hydroxy (Vit-D Deficiency, Fractures)  4. Obesity, current BMI  62 Annette [name] is currently in the action stage of change. As such, her goal is to continue with weight loss efforts. She has agreed to the Category 1 Plan.   Annette [name] will be more adherent to the plan. She will continue meal planning. She will consider protein choices.  Exercise goals:  Annette [name] will walk for 30 minutes 4 times per day active and using resistance bands.  Behavioral modification strategies: increasing lean protein intake, decreasing simple carbohydrates, increasing vegetables, increasing water intake, decreasing eating out, no skipping meals, meal planning and cooking strategies, keeping healthy foods in the home, and planning for success.  Annette [name] has agreed to follow-up with our clinic in 2-3 weeks. She was informed of the importance of frequent follow-up visits to maximize her success with intensive lifestyle modifications for her multiple health conditions.   Annette [name] was informed we would discuss her lab results at her next visit unless there is a critical issue that needs to be addressed sooner. Annette [name] agreed to keep her next visit at the agreed upon time to discuss these results.  Objective:   Blood pressure (!) 111/57, pulse 61, temperature 98 F (36.7 C), height '5\' 7"'$  (1.702 m), weight 204 lb (92.5 kg), SpO2 94 %. Body mass index is 31.95 kg/m.  General: Cooperative, alert, well developed, in no acute distress. HEENT: Conjunctivae and lids unremarkable. Cardiovascular: Regular rhythm.  Lungs: Normal work of breathing. Neurologic: No focal deficits.   Lab Results  Component Value Date   CREATININE 0.81 05/31/2019   BUN 19 05/31/2019  NA 140 05/31/2019   K 4.6 05/31/2019   CL 95 (L) 05/31/2019   CO2 30 (H) 05/31/2019   Lab Results  Component Value Date   ALT 15 06/10/2010   AST 19 06/10/2010   ALKPHOS 92 06/10/2010   BILITOT 0.4 06/10/2010   Lab Results  Component Value Date   HGBA1C 5.8 (H) 12/11/2020   Lab Results  Component Value Date   INSULIN 21.6  12/11/2020   No results found for: TSH No results found for: CHOL, HDL, LDLCALC, LDLDIRECT, TRIG, CHOLHDL Lab Results  Component Value Date   VD25OH 27.3 (L) 12/11/2020   Lab Results  Component Value Date   WBC 11.6 (H) 03/23/2015   HGB 11.2 (L) 03/23/2015   HCT 33.1 (L) 03/23/2015   MCV 89.7 03/23/2015   PLT 228 03/23/2015   No results found for: IRON, TIBC, FERRITIN  Obesity Behavioral Intervention:   Approximately 15 minutes were spent on the discussion below.  ASK: We discussed the diagnosis of obesity with Annette [name] today and Annette [name] agreed to give [place] permission to discuss obesity behavioral modification therapy today.  ASSESS: Jourdyn has the diagnosis of obesity and her BMI today is 32.0. Annette [name] is in the action stage of change.   ADVISE: Annette [name] was educated on the multiple health risks of obesity as well as the benefit of weight loss to improve her health. She was advised of the need for long term treatment and the importance of lifestyle modifications to improve her current health and to decrease her risk of future health problems.  AGREE: Multiple dietary modification options and treatment options were discussed and Annette [name] agreed to follow the recommendations documented in the above note.  ARRANGE: Annette [name] was educated on the importance of frequent visits to treat obesity as outlined per CMS and USPSTF guidelines and agreed to schedule her next follow up appointment today.  Attestation Statements:   Reviewed by clinician on day of visit: allergies, medications, problem list, medical history, surgical history, family history, social history, and previous encounter notes.  I, Lizbeth Bark, RMA, am acting as Location manager for [employer], DO.   I have reviewed the above documentation for accuracy and completeness, and I agree with the above. Jearld Lesch, DO

## 2021-04-24 ENCOUNTER — Encounter (INDEPENDENT_AMBULATORY_CARE_PROVIDER_SITE_OTHER): Payer: Self-pay | Admitting: Bariatrics

## 2021-04-24 LAB — COMPREHENSIVE METABOLIC PANEL
ALT: 7 IU/L (ref 0–32)
AST: 13 IU/L (ref 0–40)
Albumin/Globulin Ratio: 1.4 (ref 1.2–2.2)
Albumin: 4.1 g/dL (ref 3.7–4.7)
Alkaline Phosphatase: 113 IU/L (ref 44–121)
BUN/Creatinine Ratio: 20 (ref 12–28)
BUN: 15 mg/dL (ref 8–27)
Bilirubin Total: 0.3 mg/dL (ref 0.0–1.2)
CO2: 29 mmol/L (ref 20–29)
Calcium: 9.5 mg/dL (ref 8.7–10.3)
Chloride: 97 mmol/L (ref 96–106)
Creatinine, Ser: 0.75 mg/dL (ref 0.57–1.00)
Globulin, Total: 2.9 g/dL (ref 1.5–4.5)
Glucose: 91 mg/dL (ref 65–99)
Potassium: 5 mmol/L (ref 3.5–5.2)
Sodium: 139 mmol/L (ref 134–144)
Total Protein: 7 g/dL (ref 6.0–8.5)
eGFR: 85 mL/min/{1.73_m2} (ref >59)

## 2021-04-24 LAB — LIPID PANEL WITH LDL/HDL RATIO
Cholesterol, Total: 152 mg/dL (ref 100–199)
HDL: 48 mg/dL (ref >39)
LDL Chol Calc (NIH): 78 mg/dL (ref 0–99)
LDL/HDL Ratio: 1.6 ratio (ref 0.0–3.2)
Triglycerides: 147 mg/dL (ref 0–149)
VLDL Cholesterol Cal: 26 mg/dL (ref 5–40)

## 2021-04-24 LAB — VITAMIN D 25 HYDROXY (VIT D DEFICIENCY, FRACTURES): Vit D, 25-Hydroxy: 38.1 ng/mL (ref 30.0–100.0)

## 2021-04-24 LAB — HEMOGLOBIN A1C
Est. average glucose Bld gHb Est-mCnc: 128 mg/dL
Hgb A1c MFr Bld: 6.1 % — ABNORMAL HIGH (ref 4.8–5.6)

## 2021-04-24 LAB — INSULIN, RANDOM: INSULIN: 11.1 u[IU]/mL (ref 2.6–24.9)

## 2021-04-24 NOTE — Telephone Encounter (Signed)
Dr.Brown 

## 2021-05-12 ENCOUNTER — Other Ambulatory Visit (HOSPITAL_COMMUNITY): Payer: Self-pay

## 2021-05-12 ENCOUNTER — Ambulatory Visit (INDEPENDENT_AMBULATORY_CARE_PROVIDER_SITE_OTHER): Payer: PPO | Admitting: Bariatrics

## 2021-05-12 DIAGNOSIS — Z1231 Encounter for screening mammogram for malignant neoplasm of breast: Secondary | ICD-10-CM | POA: Diagnosis not present

## 2021-05-13 ENCOUNTER — Other Ambulatory Visit (HOSPITAL_COMMUNITY): Payer: Self-pay

## 2021-05-13 MED ORDER — LEVOTHYROXINE SODIUM 125 MCG PO TABS
125.0000 ug | ORAL_TABLET | Freq: Every morning | ORAL | 1 refills | Status: DC
  Filled 2021-05-13: qty 90, 90d supply, fill #0
  Filled 2021-08-17: qty 90, 90d supply, fill #1

## 2021-05-14 ENCOUNTER — Ambulatory Visit (INDEPENDENT_AMBULATORY_CARE_PROVIDER_SITE_OTHER)
Admission: RE | Admit: 2021-05-14 | Discharge: 2021-05-14 | Disposition: A | Discharge status: Home / Self Care | Payer: PPO | Source: Ambulatory Visit | Attending: Family Medicine | Admitting: Family Medicine

## 2021-05-14 ENCOUNTER — Other Ambulatory Visit: Payer: Self-pay

## 2021-05-14 DIAGNOSIS — Z87891 Personal history of nicotine dependence: Secondary | ICD-10-CM

## 2021-05-15 ENCOUNTER — Ambulatory Visit (INDEPENDENT_AMBULATORY_CARE_PROVIDER_SITE_OTHER): Payer: PPO | Admitting: Bariatrics

## 2021-05-26 ENCOUNTER — Other Ambulatory Visit (HOSPITAL_COMMUNITY): Payer: Self-pay

## 2021-05-26 MED ORDER — PROPRANOLOL HCL ER 60 MG PO CP24
60.0000 mg | ORAL_CAPSULE | Freq: Every day | ORAL | 1 refills | Status: DC
  Filled 2021-05-26: qty 90, 90d supply, fill #0
  Filled 2021-08-17: qty 90, 90d supply, fill #1

## 2021-05-28 ENCOUNTER — Other Ambulatory Visit: Payer: Self-pay

## 2021-05-28 ENCOUNTER — Ambulatory Visit (INDEPENDENT_AMBULATORY_CARE_PROVIDER_SITE_OTHER): Payer: PPO | Admitting: Bariatrics

## 2021-05-28 ENCOUNTER — Encounter (INDEPENDENT_AMBULATORY_CARE_PROVIDER_SITE_OTHER): Payer: Self-pay | Admitting: Bariatrics

## 2021-05-28 VITALS — BP 119/72 | HR 78 | Temp 98.1°F | Ht 67.0 in | Wt 201.0 lb

## 2021-05-28 DIAGNOSIS — E7800 Pure hypercholesterolemia, unspecified: Secondary | ICD-10-CM | POA: Diagnosis not present

## 2021-05-28 DIAGNOSIS — E038 Other specified hypothyroidism: Secondary | ICD-10-CM | POA: Diagnosis not present

## 2021-05-28 DIAGNOSIS — Z6835 Body mass index (BMI) 35.0-35.9, adult: Secondary | ICD-10-CM

## 2021-05-28 DIAGNOSIS — E66812 Obesity, class 2: Secondary | ICD-10-CM

## 2021-05-28 NOTE — Progress Notes (Signed)
Chief Complaint:   OBESITY Annette [name] is here to discuss her progress with her obesity treatment plan along with follow-up of her obesity related diagnoses. Annette [name] is on the Category 1 Plan and states she is following her eating plan approximately 40-50% of the time. Annette [name] states she is walking 1-2 miles 3-4 times per week.  Today's visit was #: 8 Starting weight: 224 lbs Starting date: 12/11/2020 Today's weight: 201 lbs Today's date: 05/28/2021 Total lbs lost to date: 23 lbs Total lbs lost since last in-office visit: 3 lbs  Interim History: Annette [name] is down an additional 3 lbs since her last visit. She is doing well with her water intake.  Subjective:   1. Hypercholesterolemia Annette [name] denies abdominal pain.  2. Other specified hypothyroidism Annette [name] is taking Synthroid currently.  Assessment/Plan:   1. Hypercholesterolemia Cardiovascular risk and specific lipid/LDL goals reviewed.  We discussed several lifestyle modifications today and Annette [name] will continue to work on diet, exercise and weight loss efforts. Annette [name] will decrease carbohydrates. She will continue exercise. Orders and follow up as documented in patient record.   Counseling Intensive lifestyle modifications are the first line treatment for this issue. Dietary changes: Increase soluble fiber. Decrease simple carbohydrates. Exercise changes: Moderate to vigorous-intensity aerobic activity 150 minutes per week if tolerated. Lipid-lowering medications: see documented in medical record.   2. Other specified hypothyroidism Annette [name] will continue Synthroid. Orders and follow up as documented in patient record.  Counseling Good thyroid control is important for overall health. Supratherapeutic thyroid levels are dangerous and will not improve weight loss results. Counseling: The correct way to take levothyroxine is fasting, with water, separated by at least 30 minutes from breakfast, and separated by more than 4 hours from calcium,  iron, multivitamins, acid reflux medications (PPIs).    3. Obesity, current BMI 31.5 Annette [name] is currently in the action stage of change. As such, her goal is to continue with weight loss efforts. She has agreed to the Category 1 Plan.   Annette [name] will continue meal planning. She will adhere closely to the plan at 80-90%. We will review labs from 04/23/2021.  Exercise goals:  As is.   Behavioral modification strategies: increasing lean protein intake, decreasing simple carbohydrates, increasing vegetables, increasing water intake, decreasing eating out, no skipping meals, meal planning and cooking strategies, keeping healthy foods in the home, and planning for success.  Annette [name] has agreed to follow-up with our clinic in 3 weeks. She was informed of the importance of frequent follow-up visits to maximize her success with intensive lifestyle modifications for her multiple health conditions.   Objective:   Blood pressure 119/72, pulse 78, temperature 98.1 F (36.7 C), height 5\' 7"  (1.702 m), weight 201 lb (91.2 kg), SpO2 95 %. Body mass index is 31.48 kg/m.  General: Cooperative, alert, well developed, in no acute distress. HEENT: Conjunctivae and lids unremarkable. Cardiovascular: Regular rhythm.  Lungs: Normal work of breathing. Neurologic: No focal deficits.   Lab Results  Component Value Date   CREATININE 0.75 04/23/2021   BUN 15 04/23/2021   NA 139 04/23/2021   K 5.0 04/23/2021   CL 97 04/23/2021   CO2 29 04/23/2021   Lab Results  Component Value Date   ALT 7 04/23/2021   AST 13 04/23/2021   ALKPHOS 113 04/23/2021   BILITOT 0.3 04/23/2021   Lab Results  Component Value Date   HGBA1C 6.1 (H) 04/23/2021   HGBA1C 5.8 (H) 12/11/2020   Lab Results  Component Value Date  INSULIN 11.1 04/23/2021   INSULIN 21.6 12/11/2020   No results found for: TSH Lab Results  Component Value Date   CHOL 152 04/23/2021   HDL 48 04/23/2021   LDLCALC 78 04/23/2021   TRIG 147 04/23/2021    Lab Results  Component Value Date   VD25OH 38.1 04/23/2021   VD25OH 27.3 (L) 12/11/2020   Lab Results  Component Value Date   WBC 11.6 (H) 03/23/2015   HGB 11.2 (L) 03/23/2015   HCT 33.1 (L) 03/23/2015   MCV 89.7 03/23/2015   PLT 228 03/23/2015   No results found for: IRON, TIBC, FERRITIN  Obesity Behavioral Intervention:   Approximately 15 minutes were spent on the discussion below.  ASK: We discussed the diagnosis of obesity with Annette [name] today and Annette [name] agreed to give [place] permission to discuss obesity behavioral modification therapy today.  ASSESS: Annette [name] has the diagnosis of obesity and her BMI today is 31.5. Annette [name] is in the action stage of change.   ADVISE: Annette [name] was educated on the multiple health risks of obesity as well as the benefit of weight loss to improve her health. She was advised of the need for long term treatment and the importance of lifestyle modifications to improve her current health and to decrease her risk of future health problems.  AGREE: Multiple dietary modification options and treatment options were discussed and Annette [name] agreed to follow the recommendations documented in the above note.  ARRANGE: Annette [name] was educated on the importance of frequent visits to treat obesity as outlined per CMS and USPSTF guidelines and agreed to schedule her next follow up appointment today.  Attestation Statements:   Reviewed by clinician on day of visit: allergies, medications, problem list, medical history, surgical history, family history, social history, and previous encounter notes.  I, Annette [name], RMA, am acting as Location manager for [employer], DO.   I have reviewed the above documentation for accuracy and completeness, and I agree with the above. Annette Lesch, DO

## 2021-05-30 ENCOUNTER — Encounter: Payer: Self-pay | Admitting: *Deleted

## 2021-05-30 DIAGNOSIS — Z87891 Personal history of nicotine dependence: Secondary | ICD-10-CM

## 2021-06-03 ENCOUNTER — Encounter (INDEPENDENT_AMBULATORY_CARE_PROVIDER_SITE_OTHER): Payer: Self-pay | Admitting: Bariatrics

## 2021-06-25 ENCOUNTER — Ambulatory Visit (INDEPENDENT_AMBULATORY_CARE_PROVIDER_SITE_OTHER): Payer: PPO | Admitting: Bariatrics

## 2021-07-11 ENCOUNTER — Other Ambulatory Visit: Payer: Self-pay | Admitting: Acute Care

## 2021-07-11 ENCOUNTER — Other Ambulatory Visit (HOSPITAL_COMMUNITY): Payer: Self-pay

## 2021-07-11 MED ORDER — SALMETEROL XINAFOATE 50 MCG/ACT IN AEPB
1.0000 | INHALATION_SPRAY | Freq: Two times a day (BID) | RESPIRATORY_TRACT | 1 refills | Status: DC
Start: 2021-07-11 — End: 2022-02-16
  Filled 2021-07-11: qty 60, 30d supply, fill #0
  Filled 2021-08-18: qty 60, 30d supply, fill #1
  Filled 2021-09-26 – 2021-09-27 (×2): qty 60, 30d supply, fill #2
  Filled 2021-11-07: qty 60, 30d supply, fill #3
  Filled 2021-12-18: qty 60, 30d supply, fill #4
  Filled 2022-01-17: qty 60, 30d supply, fill #5

## 2021-07-14 ENCOUNTER — Other Ambulatory Visit (HOSPITAL_COMMUNITY): Payer: Self-pay

## 2021-08-06 ENCOUNTER — Ambulatory Visit: Payer: PPO | Admitting: Acute Care

## 2021-08-18 ENCOUNTER — Other Ambulatory Visit (HOSPITAL_COMMUNITY): Payer: Self-pay

## 2021-08-19 ENCOUNTER — Other Ambulatory Visit (HOSPITAL_COMMUNITY): Payer: Self-pay

## 2021-08-20 ENCOUNTER — Ambulatory Visit: Payer: PPO | Admitting: Acute Care

## 2021-08-20 NOTE — Progress Notes (Deleted)
History of Present Illness Annette [name] is a 71 y.o. female former smoker ( 38 pack year smoking history, quit 2010) with COPD a history of CAD.She is followed by Dr. Lamonte [name]. Maintenance : Serevent  Rescue : [facility] Lung Cancer Screening Due 04/2021 Zyrtec for allergies as needed XLear and Flonase for sinus issues as needed Nasal Irrigations as needed Mucinex and Flutter Valve as needed for chest congestion Covid 19 diagnosis>> 08/2020>> Not hospitalized, but in bed x 3 weeks.  Recovered but post viral fatigue. [facility]. She has lost 10-12 pounds.     08/20/2021  Test Results: 04/2021 LDCT  Lung RADS 2: nodules that are benign in appearance and behavior with a very low likelihood of becoming a clinically active cancer due to size or lack of growth. Recommendation per radiology is for a repeat LDCT in 12 months.   CBC Latest Ref Rng & Units 03/23/2015 03/15/2015 06/01/2013  WBC 4.0 - 10.5 K/uL 11.6(H) 7.4 6.3  Hemoglobin 12.0 - 15.0 g/dL 11.2(L) 14.2 13.8  Hematocrit 36.0 - 46.0 % 33.1(L) 42.4 40.6  Platelets 150 - 400 K/uL 228 332 293.0    BMP Latest Ref Rng & Units 04/23/2021 05/31/2019 01/28/2018  Glucose 65 - 99 mg/dL 91 98 96  BUN 8 - 27 mg/dL 15 19 20   Creatinine 0.57 - 1.00 mg/dL 0.75 0.81 0.81  BUN/Creat Ratio 12 - 28 20 23 25   Sodium 134 - 144 mmol/L 139 140 139  Potassium 3.5 - 5.2 mmol/L 5.0 4.6 5.2  Chloride 96 - 106 mmol/L 97 95(L) 96  CO2 20 - 29 mmol/L 29 30(H) 28  Calcium 8.7 - 10.3 mg/dL 9.5 9.8 9.9    BNP    Component Value Date/Time   BNP 17.4 05/31/2019 1402    ProBNP No results found for: PROBNP  PFT    Component Value Date/Time   FEV1PRE 1.51 10/10/2019 1622   FEV1POST 1.46 10/10/2019 1622   FVCPRE 2.27 10/10/2019 1622   FVCPOST 2.20 10/10/2019 1622   TLC 5.05 10/10/2019 1622   DLCOUNC 18.30 10/10/2019 1622   PREFEV1FVCRT 66 10/10/2019 1622   PSTFEV1FVCRT 67 10/10/2019 1622    No results found.   Past  medical hx Past Medical History:  Diagnosis Date   Arthritis    Right hip, end stage   Back pain    COPD (chronic obstructive pulmonary disease) (HCC)    Coronary artery calcification seen on computed tomography 02/2018   Coronary calcium score 79.  Coronary CT angiogram: Mild CAD and proximal LAD, proximal RCA and mid LCx.  Moderate plaque ostial LCx.  Mildly dilated pulmonary artery, suggestive of possible pulmonary hypertension   Difficulty sleeping    DUE TO PAIN   Edema of both lower extremities    Emphysema of lung (HCC)    Fluid retention    TAKES HCTZ   GERD (gastroesophageal reflux disease)    Hemorrhoids    Hyperlipidemia    Hypertension    Hypothyroidism    IBS (irritable bowel syndrome)    Irritable bowel syndrome    Joint pain    Macular degeneration    Osteopenia    Shingles 2004   SOB (shortness of breath)    Tremors of nervous system    TAKES PROPRANOLOL TO TX   Varicose veins    Vitamin D deficiency      Social History   Tobacco Use   Smoking status: Former    Packs/day:  1.00    Years: 38.00    Pack years: 38.00    Types: Cigarettes    Quit date: 03/14/2009    Years since quitting: 12.4   Smokeless tobacco: Never  Substance Use Topics   Alcohol use: Yes    Comment: beer every 6-8 months   Drug use: No    Ms.Annette [name] reports that she quit smoking about 12 years ago. She has a 38.00 pack-year smoking history. She has never used smokeless tobacco. She reports current alcohol use. She reports that she does not use drugs.  Tobacco Cessation: Counseling given: Not Answered   Past surgical hx, Family hx, Social hx all reviewed.  Current Outpatient Medications on File Prior to Visit  Medication Sig   albuterol (VENTOLIN HFA) 108 (90 Base) MCG/ACT inhaler Inhale 2 puffs into the lungs every 6 (six) hours as needed for wheezing or shortness of breath.   aspirin EC 81 MG tablet Take 81 mg by mouth daily.   Cholecalciferol (KP VITAMIN D3) 50 MCG (2000  UT) CAPS Take by mouth.   Coenzyme Q10 (CO Q-10) 300 MG CAPS 1 capsule   cycloSPORINE (RESTASIS) 0.05 % ophthalmic emulsion Instill 1 drop into Both Eyes twice a day   hydrochlorothiazide (HYDRODIURIL) 25 MG tablet Take 1 tablet by mouth once daily in the morning.   levothyroxine (SYNTHROID) 125 MCG tablet Take 1 tablet by mouth every morning on an empty stomach.   Magnesium 250 MG TABS Take by mouth.   Naproxen Sodium (ALEVE PO) Take 400 mg by mouth as directed.   propranolol ER (INDERAL LA) 60 MG 24 hr capsule Take 1 capsule by mouth once daily.   rosuvastatin (CRESTOR) 5 MG tablet Take 1 tablet by mouth once a day.   salmeterol (SEREVENT) 50 MCG/ACT diskus inhaler Inhale 1 puff into the lungs in the morning and at bedtime.   No current facility-administered medications on file prior to visit.     Allergies  Allergen Reactions   Augmentin [Amoxicillin-Pot Clavulanate] Nausea And Vomiting   Codeine Other (See Comments)    Causes her to stay awake    Other Hives and Other (See Comments)    IV contrast    Iodinated Contrast Media Rash    Review Of Systems:  Constitutional:   No  weight loss, night sweats,  Fevers, chills, fatigue, or  lassitude.  HEENT:   No headaches,  Difficulty swallowing,  Tooth/dental problems, or  Sore throat,                No sneezing, itching, ear ache, nasal congestion, post nasal drip,   CV:  No chest pain,  Orthopnea, PND, swelling in lower extremities, anasarca, dizziness, palpitations, syncope.   GI  No heartburn, indigestion, abdominal pain, nausea, vomiting, diarrhea, change in bowel habits, loss of appetite, bloody stools.   Resp: No shortness of breath with exertion or at rest.  No excess mucus, no productive cough,  No non-productive cough,  No coughing up of blood.  No change in color of mucus.  No wheezing.  No chest wall deformity  Skin: no rash or lesions.  GU: no dysuria, change in color of urine, no urgency or frequency.  No flank  pain, no hematuria   MS:  No joint pain or swelling.  No decreased range of motion.  No back pain.  Psych:  No change in mood or affect. No depression or anxiety.  No memory loss.   Vital Signs There were no vitals taken  for this visit.   Physical Exam:  General- No distress,  A&Ox3 ENT: No sinus tenderness, TM clear, pale nasal mucosa, no oral exudate,no post nasal drip, no LAN Cardiac: S1, S2, regular rate and rhythm, no murmur Chest: No wheeze/ rales/ dullness; no accessory muscle use, no nasal flaring, no sternal retractions Abd.: Soft Non-tender Ext: No clubbing cyanosis, edema Neuro:  normal strength Skin: No rashes, warm and dry Psych: normal mood and behavior   Assessment/Plan COPD and Dyspnea Stable interval Plan Continue Symbicort 2 puffs twice daily Rinse mouth after use. Continue Rescue inhaler  As needed for breakthrough shortness of breath. Continue Zyrtec at bedtime, daily when pollen levels are high. Continue to work on increasing your activity. Let me know when you are ready for [place] to re-refer to Pulmonary Rehab Use Mucinex as needed for chest congestion. Work on weight loss. Call [facility] directly to schedule referral >> [phone]  We will [facility] to [facility] for [facility] loss. Continue Flonase and X LEAR nasal sprays. Consider Neti Pot nasal irrigations Use flutter valve and Mucinex as needed for chest congestion. Lung Cancer Screening due  04/2021 ( Screening program will order).Please message Langley Gauss Please have Ronalee Belts call patient to schedule 04/2022 Low Dose CT.  Follow up in 6 months with Dr. Lamonte [name], or Judson Roch NP  Body mass index is 34.6 kg/m. >> 10/2020 Body mass index is 33.61 kg/m. >> 01/2021 Has lost 10-12 pounds since referral Low Vitamin D level has been treated Thyroid medication has been adjusted Plan Continue to follow up with [facility].  Keep up the great work   Tobacco Abuse 38 pack year  smoking history Quit 2010 Plan Counseling to remain smoke free Follow up LDCT 04/2021 We will have Rodena Piety call to schedule CT Chest 04/2021  Magdalen Spatz, NP 08/20/2021  1:47 PM

## 2021-09-16 ENCOUNTER — Other Ambulatory Visit (HOSPITAL_COMMUNITY): Payer: Self-pay

## 2021-09-16 DIAGNOSIS — L538 Other specified erythematous conditions: Secondary | ICD-10-CM | POA: Diagnosis not present

## 2021-09-16 DIAGNOSIS — L718 Other rosacea: Secondary | ICD-10-CM | POA: Diagnosis not present

## 2021-09-16 DIAGNOSIS — L308 Other specified dermatitis: Secondary | ICD-10-CM | POA: Diagnosis not present

## 2021-09-16 DIAGNOSIS — L304 Erythema intertrigo: Secondary | ICD-10-CM | POA: Diagnosis not present

## 2021-09-16 DIAGNOSIS — L820 Inflamed seborrheic keratosis: Secondary | ICD-10-CM | POA: Diagnosis not present

## 2021-09-16 DIAGNOSIS — D485 Neoplasm of uncertain behavior of skin: Secondary | ICD-10-CM | POA: Diagnosis not present

## 2021-09-16 DIAGNOSIS — R208 Other disturbances of skin sensation: Secondary | ICD-10-CM | POA: Diagnosis not present

## 2021-09-16 DIAGNOSIS — L298 Other pruritus: Secondary | ICD-10-CM | POA: Diagnosis not present

## 2021-09-16 DIAGNOSIS — L570 Actinic keratosis: Secondary | ICD-10-CM | POA: Diagnosis not present

## 2021-09-16 MED ORDER — METRONIDAZOLE 0.75 % EX CREA
TOPICAL_CREAM | CUTANEOUS | 3 refills | Status: DC
  Filled 2021-09-16: qty 45, 30d supply, fill #0

## 2021-09-16 MED ORDER — CLOBETASOL PROPIONATE 0.05 % EX CREA
TOPICAL_CREAM | CUTANEOUS | 3 refills | Status: DC
  Filled 2021-09-16: qty 60, 30d supply, fill #0

## 2021-09-16 MED ORDER — KETOCONAZOLE 2 % EX CREA
TOPICAL_CREAM | CUTANEOUS | 5 refills | Status: DC
  Filled 2021-09-16: qty 60, 30d supply, fill #0

## 2021-09-17 ENCOUNTER — Other Ambulatory Visit (HOSPITAL_COMMUNITY): Payer: Self-pay

## 2021-09-27 ENCOUNTER — Other Ambulatory Visit (HOSPITAL_COMMUNITY): Payer: Self-pay

## 2021-09-29 ENCOUNTER — Other Ambulatory Visit (HOSPITAL_COMMUNITY): Payer: Self-pay

## 2021-10-07 ENCOUNTER — Other Ambulatory Visit (HOSPITAL_COMMUNITY): Payer: Self-pay

## 2021-10-07 DIAGNOSIS — J342 Deviated nasal septum: Secondary | ICD-10-CM | POA: Diagnosis not present

## 2021-10-07 DIAGNOSIS — R040 Epistaxis: Secondary | ICD-10-CM | POA: Diagnosis not present

## 2021-10-07 DIAGNOSIS — J0141 Acute recurrent pansinusitis: Secondary | ICD-10-CM | POA: Diagnosis not present

## 2021-10-07 DIAGNOSIS — J310 Chronic rhinitis: Secondary | ICD-10-CM | POA: Diagnosis not present

## 2021-10-07 DIAGNOSIS — H6090 Unspecified otitis externa, unspecified ear: Secondary | ICD-10-CM | POA: Insufficient documentation

## 2021-10-07 MED ORDER — CEFDINIR 300 MG PO CAPS
ORAL_CAPSULE | ORAL | 0 refills | Status: DC
  Filled 2021-10-07: qty 20, 10d supply, fill #0

## 2021-10-07 MED ORDER — FLUTICASONE PROPIONATE 50 MCG/ACT NA SUSP
NASAL | 11 refills | Status: DC
  Filled 2021-10-07: qty 16, 30d supply, fill #0

## 2021-10-20 ENCOUNTER — Other Ambulatory Visit (HOSPITAL_COMMUNITY): Payer: Self-pay

## 2021-11-08 ENCOUNTER — Other Ambulatory Visit (HOSPITAL_COMMUNITY): Payer: Self-pay

## 2021-11-10 ENCOUNTER — Other Ambulatory Visit (HOSPITAL_COMMUNITY): Payer: Self-pay

## 2021-11-10 MED ORDER — LEVOTHYROXINE SODIUM 125 MCG PO TABS
125.0000 ug | ORAL_TABLET | Freq: Every morning | ORAL | 0 refills | Status: DC
  Filled 2021-11-10: qty 90, 90d supply, fill #0

## 2021-11-29 ENCOUNTER — Other Ambulatory Visit (HOSPITAL_COMMUNITY): Payer: Self-pay

## 2021-12-01 ENCOUNTER — Other Ambulatory Visit (HOSPITAL_COMMUNITY): Payer: Self-pay

## 2021-12-01 MED ORDER — PROPRANOLOL HCL ER 60 MG PO CP24
60.0000 mg | ORAL_CAPSULE | Freq: Every day | ORAL | 0 refills | Status: DC
  Filled 2021-12-01: qty 90, 90d supply, fill #0

## 2021-12-01 MED ORDER — HYDROCHLOROTHIAZIDE 25 MG PO TABS
25.0000 mg | ORAL_TABLET | Freq: Every morning | ORAL | 0 refills | Status: DC
  Filled 2021-12-01: qty 90, 90d supply, fill #0

## 2021-12-02 ENCOUNTER — Other Ambulatory Visit (HOSPITAL_COMMUNITY): Payer: Self-pay

## 2021-12-08 ENCOUNTER — Other Ambulatory Visit: Payer: Self-pay | Admitting: Family Medicine

## 2021-12-08 DIAGNOSIS — J449 Chronic obstructive pulmonary disease, unspecified: Secondary | ICD-10-CM | POA: Diagnosis not present

## 2021-12-08 DIAGNOSIS — E039 Hypothyroidism, unspecified: Secondary | ICD-10-CM | POA: Diagnosis not present

## 2021-12-08 DIAGNOSIS — I2510 Atherosclerotic heart disease of native coronary artery without angina pectoris: Secondary | ICD-10-CM | POA: Diagnosis not present

## 2021-12-08 DIAGNOSIS — Z0000 Encounter for general adult medical examination without abnormal findings: Secondary | ICD-10-CM | POA: Diagnosis not present

## 2021-12-08 DIAGNOSIS — M8588 Other specified disorders of bone density and structure, other site: Secondary | ICD-10-CM

## 2021-12-08 DIAGNOSIS — I700 Atherosclerosis of aorta: Secondary | ICD-10-CM | POA: Diagnosis not present

## 2021-12-08 DIAGNOSIS — Z1331 Encounter for screening for depression: Secondary | ICD-10-CM | POA: Diagnosis not present

## 2021-12-08 DIAGNOSIS — I119 Hypertensive heart disease without heart failure: Secondary | ICD-10-CM | POA: Diagnosis not present

## 2021-12-08 DIAGNOSIS — E7800 Pure hypercholesterolemia, unspecified: Secondary | ICD-10-CM | POA: Diagnosis not present

## 2021-12-08 DIAGNOSIS — J309 Allergic rhinitis, unspecified: Secondary | ICD-10-CM | POA: Diagnosis not present

## 2021-12-08 DIAGNOSIS — R251 Tremor, unspecified: Secondary | ICD-10-CM | POA: Diagnosis not present

## 2021-12-08 DIAGNOSIS — H35033 Hypertensive retinopathy, bilateral: Secondary | ICD-10-CM | POA: Diagnosis not present

## 2021-12-09 ENCOUNTER — Other Ambulatory Visit (HOSPITAL_COMMUNITY): Payer: Self-pay

## 2021-12-09 DIAGNOSIS — J0141 Acute recurrent pansinusitis: Secondary | ICD-10-CM | POA: Diagnosis not present

## 2021-12-09 DIAGNOSIS — J342 Deviated nasal septum: Secondary | ICD-10-CM | POA: Diagnosis not present

## 2021-12-09 DIAGNOSIS — R040 Epistaxis: Secondary | ICD-10-CM | POA: Diagnosis not present

## 2021-12-09 MED ORDER — FLUTICASONE PROPIONATE 50 MCG/ACT NA SUSP
NASAL | 11 refills | Status: DC
  Filled 2021-12-09: qty 16, 30d supply, fill #0
  Filled 2022-02-13: qty 16, 30d supply, fill #1
  Filled 2022-03-16: qty 16, 30d supply, fill #2

## 2021-12-09 MED ORDER — LEVOFLOXACIN 500 MG PO TABS
ORAL_TABLET | ORAL | 0 refills | Status: DC
  Filled 2021-12-09: qty 14, 14d supply, fill #0

## 2021-12-10 ENCOUNTER — Other Ambulatory Visit (HOSPITAL_COMMUNITY): Payer: Self-pay

## 2021-12-10 MED ORDER — ERGOCALCIFEROL 1.25 MG (50000 UT) PO CAPS
ORAL_CAPSULE | ORAL | 0 refills | Status: DC
  Filled 2021-12-10: qty 12, 84d supply, fill #0
  Filled 2022-03-01: qty 12, 84d supply, fill #1

## 2021-12-22 ENCOUNTER — Other Ambulatory Visit (HOSPITAL_COMMUNITY): Payer: Self-pay

## 2021-12-26 DIAGNOSIS — Z1211 Encounter for screening for malignant neoplasm of colon: Secondary | ICD-10-CM | POA: Diagnosis not present

## 2022-01-08 ENCOUNTER — Encounter: Payer: Self-pay | Admitting: Internal Medicine

## 2022-01-08 ENCOUNTER — Ambulatory Visit (INDEPENDENT_AMBULATORY_CARE_PROVIDER_SITE_OTHER): Payer: PPO | Admitting: Internal Medicine

## 2022-01-08 ENCOUNTER — Other Ambulatory Visit (HOSPITAL_COMMUNITY): Payer: Self-pay

## 2022-01-08 DIAGNOSIS — E039 Hypothyroidism, unspecified: Secondary | ICD-10-CM | POA: Diagnosis not present

## 2022-01-08 DIAGNOSIS — G250 Essential tremor: Secondary | ICD-10-CM | POA: Insufficient documentation

## 2022-01-08 DIAGNOSIS — J449 Chronic obstructive pulmonary disease, unspecified: Secondary | ICD-10-CM | POA: Diagnosis not present

## 2022-01-08 DIAGNOSIS — R7303 Prediabetes: Secondary | ICD-10-CM | POA: Diagnosis not present

## 2022-01-08 DIAGNOSIS — J301 Allergic rhinitis due to pollen: Secondary | ICD-10-CM | POA: Diagnosis not present

## 2022-01-08 DIAGNOSIS — M79642 Pain in left hand: Secondary | ICD-10-CM | POA: Diagnosis not present

## 2022-01-08 DIAGNOSIS — E7849 Other hyperlipidemia: Secondary | ICD-10-CM | POA: Diagnosis not present

## 2022-01-08 MED ORDER — DOXYCYCLINE HYCLATE 100 MG PO TABS
100.0000 mg | ORAL_TABLET | Freq: Two times a day (BID) | ORAL | 0 refills | Status: AC
  Filled 2022-01-08: qty 20, 10d supply, fill #0

## 2022-01-08 NOTE — Assessment & Plan Note (Signed)
Taking flonase and having daily nose bleeds in setting of recent infection. Advised she can consider 1-2 week drug holiday from flonase to see if nose bleeds stop then resume.

## 2022-01-08 NOTE — Assessment & Plan Note (Signed)
Taking propranolol 60 mg daily and has adequate control. Does not need refill today but will refill when due at same dosage.

## 2022-01-08 NOTE — Assessment & Plan Note (Signed)
Reviewed recent TSH at goal on synthroid 125 mcg daily. Continue at current dosing.

## 2022-01-08 NOTE — Assessment & Plan Note (Signed)
She will continue to use albuterol prn and serevent as maintenance. She does have some limitation in functional activity and is working to increase exercise.

## 2022-01-08 NOTE — Assessment & Plan Note (Signed)
Taking crestor 5 mg daily and will continue. Reviewed recent labs from prior PCP.

## 2022-01-08 NOTE — Progress Notes (Signed)
   Subjective:   Patient ID: Annette [name], female    DOB: Dec 16, 1950, 71 y.o.   MRN: [id number]  HPI The patient is a 71 YO female coming in new for left hand pain and ongoing care.  PMH, [facility], social history reviewed and updated  Review of Systems  Constitutional: Negative.   HENT: Negative.    Eyes: Negative.   Respiratory:  Negative for cough, chest tightness and shortness of breath.   Cardiovascular:  Negative for chest pain, palpitations and leg swelling.  Gastrointestinal:  Negative for abdominal distention, abdominal pain, constipation, diarrhea, nausea and vomiting.  Musculoskeletal:  Positive for arthralgias and myalgias.  Skin:  Positive for rash.  Neurological: Negative.   Psychiatric/Behavioral: Negative.     Objective:  Physical Exam Constitutional:      Appearance: She is well-developed.  HENT:     Head: Normocephalic and atraumatic.  Cardiovascular:     Rate and Rhythm: Normal rate and regular rhythm.  Pulmonary:     Effort: Pulmonary effort is normal. No respiratory distress.     Breath sounds: Normal breath sounds. No wheezing or rales.  Abdominal:     General: Bowel sounds are normal. There is no distension.     Palpations: Abdomen is soft.     Tenderness: There is no abdominal tenderness. There is no rebound.  Musculoskeletal:        General: Tenderness present.     Cervical back: Normal range of motion.  Skin:    General: Skin is warm and dry.  Neurological:     Mental Status: She is alert and oriented to person, place, and time.     Coordination: Coordination normal.    Vitals:   01/08/22 0820  BP: 118/74  Pulse: 64  Resp: 18  SpO2: 96%  Weight: 224 lb (101.6 kg)  Height: '5\' 7"'$  (1.702 m)    Assessment & Plan:

## 2022-01-08 NOTE — Patient Instructions (Addendum)
We will get the records. We have sent in the doxycycline to take 1 pill twice a day for 10 days.

## 2022-01-08 NOTE — Assessment & Plan Note (Signed)
Recent HgA1c 6.1 will check yearly. Getting records from PCP.

## 2022-01-08 NOTE — Assessment & Plan Note (Signed)
Redness at site of prior surgery with soft tissue/tendon infection 2017. Will be aggressive with doxycycline 10 day course to avoid recurrence.

## 2022-01-09 DIAGNOSIS — J3489 Other specified disorders of nose and nasal sinuses: Secondary | ICD-10-CM | POA: Diagnosis not present

## 2022-01-09 DIAGNOSIS — J329 Chronic sinusitis, unspecified: Secondary | ICD-10-CM | POA: Diagnosis not present

## 2022-01-09 DIAGNOSIS — J341 Cyst and mucocele of nose and nasal sinus: Secondary | ICD-10-CM | POA: Diagnosis not present

## 2022-01-09 DIAGNOSIS — J310 Chronic rhinitis: Secondary | ICD-10-CM | POA: Diagnosis not present

## 2022-01-09 DIAGNOSIS — J0141 Acute recurrent pansinusitis: Secondary | ICD-10-CM | POA: Diagnosis not present

## 2022-01-09 DIAGNOSIS — R040 Epistaxis: Secondary | ICD-10-CM | POA: Diagnosis not present

## 2022-01-09 DIAGNOSIS — J342 Deviated nasal septum: Secondary | ICD-10-CM | POA: Diagnosis not present

## 2022-01-15 ENCOUNTER — Ambulatory Visit: Payer: PPO | Admitting: Internal Medicine

## 2022-01-17 ENCOUNTER — Other Ambulatory Visit (HOSPITAL_COMMUNITY): Payer: Self-pay

## 2022-01-19 ENCOUNTER — Other Ambulatory Visit (HOSPITAL_COMMUNITY): Payer: Self-pay

## 2022-01-19 MED ORDER — ROSUVASTATIN CALCIUM 5 MG PO TABS
5.0000 mg | ORAL_TABLET | Freq: Every day | ORAL | 1 refills | Status: DC
  Filled 2022-01-19: qty 90, 90d supply, fill #0
  Filled 2022-04-16: qty 90, 90d supply, fill #1

## 2022-01-20 ENCOUNTER — Other Ambulatory Visit (HOSPITAL_COMMUNITY): Payer: Self-pay

## 2022-01-30 ENCOUNTER — Ambulatory Visit (INDEPENDENT_AMBULATORY_CARE_PROVIDER_SITE_OTHER): Payer: PPO | Admitting: Internal Medicine

## 2022-01-30 ENCOUNTER — Encounter: Payer: Self-pay | Admitting: Internal Medicine

## 2022-01-30 ENCOUNTER — Other Ambulatory Visit (HOSPITAL_COMMUNITY): Payer: Self-pay

## 2022-01-30 VITALS — BP 122/68 | HR 72 | Temp 97.8°F | Ht 67.0 in | Wt 223.0 lb

## 2022-01-30 DIAGNOSIS — H6993 Unspecified Eustachian tube disorder, bilateral: Secondary | ICD-10-CM | POA: Insufficient documentation

## 2022-01-30 DIAGNOSIS — B009 Herpesviral infection, unspecified: Secondary | ICD-10-CM | POA: Insufficient documentation

## 2022-01-30 DIAGNOSIS — H6983 Other specified disorders of Eustachian tube, bilateral: Secondary | ICD-10-CM | POA: Insufficient documentation

## 2022-01-30 DIAGNOSIS — E663 Overweight: Secondary | ICD-10-CM | POA: Insufficient documentation

## 2022-01-30 DIAGNOSIS — J0141 Acute recurrent pansinusitis: Secondary | ICD-10-CM

## 2022-01-30 DIAGNOSIS — J301 Allergic rhinitis due to pollen: Secondary | ICD-10-CM | POA: Diagnosis not present

## 2022-01-30 DIAGNOSIS — R42 Dizziness and giddiness: Secondary | ICD-10-CM | POA: Diagnosis not present

## 2022-01-30 MED ORDER — TRIAMCINOLONE ACETONIDE 55 MCG/ACT NA AERO
2.0000 | INHALATION_SPRAY | Freq: Every day | NASAL | 12 refills | Status: DC
  Filled 2022-01-30: qty 16.9, 30d supply, fill #0
  Filled 2022-02-13: qty 16.9, 1d supply, fill #0

## 2022-01-30 MED ORDER — FEXOFENADINE HCL 180 MG PO TABS
180.0000 mg | ORAL_TABLET | Freq: Every day | ORAL | 11 refills | Status: DC
  Filled 2022-01-30 – 2022-02-13 (×2): qty 30, 30d supply, fill #0

## 2022-01-30 MED ORDER — GUAIFENESIN ER 600 MG PO TB12
1200.0000 mg | ORAL_TABLET | Freq: Two times a day (BID) | ORAL | 1 refills | Status: DC | PRN
  Filled 2022-01-30: qty 60, 15d supply, fill #0

## 2022-01-30 MED ORDER — MECLIZINE HCL 12.5 MG PO TABS
12.5000 mg | ORAL_TABLET | Freq: Three times a day (TID) | ORAL | 2 refills | Status: DC | PRN
  Filled 2022-01-30: qty 27, 9d supply, fill #0
  Filled 2022-01-30: qty 13, 5d supply, fill #0
  Filled 2022-03-01: qty 40, 14d supply, fill #1

## 2022-01-30 NOTE — Patient Instructions (Signed)
Please take all new medication as prescribed - the meclizine as needed, as well as allegra, nasacort, and mucinex twice per day  Please continue all other medications as before, and refills have been done if requested.  Please have the pharmacy call with any other refills you may need.  Please keep your appointments with your specialists as you may have planned

## 2022-01-30 NOTE — Progress Notes (Unsigned)
Patient ID: Annette [name], female   DOB: 1951/04/10, 71 y.o.   MRN: [id number]

## 2022-01-30 NOTE — Progress Notes (Signed)
Patient ID: Annette [name], female   DOB: 1951/08/09, 71 y.o.   MRN: [id number]        Chief Complaint: follow up allergies, eustachian tube dysfunction, hx of recurrent pansinusitis, allergies       HPI:  Annette [name] is a 71 y.o. 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  Did have CT Sinus about 3 wks ago per ENT Dr Wilburn Cornelia - neg per pt report.  Havign some heat in flashes ongoign for months, but no high fever, cihlls,St, cough, and Pt denies chest pain, increased sob or doe, wheezing, orthopnea, PND, increased LE swelling, palpitations, dizziness or syncope.   Pt denies polydipsia, polyuria, or new focal neuro s/s, except for diffuse pain in hands, feet and ankles started just after taking the levaquin.  Did have fairly recent labs April 2023 with [facility] with normal sugar and other labs ok per pt report, with good lipids and good medication complaince.       Wt Readings from Last 3 Encounters:  01/30/22 223 lb (101.2 kg)  01/08/22 224 lb (101.6 kg)  05/28/21 201 lb (91.2 kg)   BP Readings from Last 3 Encounters:  01/30/22 122/68  01/08/22 118/74  05/28/21 119/72         Past Medical History:  Diagnosis Date   Arthritis    Right hip, end stage   Back pain    COPD (chronic obstructive pulmonary disease) (HCC)    Coronary artery calcification seen on computed tomography 02/2018   Coronary calcium score 79.  Coronary CT angiogram: Mild CAD and proximal LAD, proximal RCA and mid LCx.  Moderate plaque ostial LCx.  Mildly dilated pulmonary artery, suggestive of possible pulmonary hypertension    Difficulty sleeping    DUE TO PAIN   Edema of both lower extremities    Emphysema of lung (HCC)    Fluid retention    TAKES HCTZ   GERD (gastroesophageal reflux disease)    Hemorrhoids    Hyperlipidemia    Hypertension    Hypothyroidism    IBS (irritable bowel syndrome)    Irritable bowel syndrome    Joint pain    Macular degeneration    Osteopenia    Shingles 2004   SOB (shortness of breath)    Tremors of nervous system    TAKES PROPRANOLOL TO TX   Varicose veins    Vitamin D deficiency    Past Surgical History:  Procedure Laterality Date   ABDOMINAL HYSTERECTOMY  2000   CATARACT EXTRACTION Right    CHOLECYSTECTOMY  1994   COLONOSCOPY     ERCP  2004   sludge in CBD (jaundiced)   ESOPHAGOGASTRODUODENOSCOPY     INCISE AND DRAIN ABCESS Left 05/2016   wrist   INCISION / DRAINAGE HAND / FINGER     LEFT INDEX FINGER   JOINT REPLACEMENT     Lexiscan Myoview  06/2019   Lexiscan Myoview 07/05/2019: LOW RISK.  EF 60 to 65%.  Fixed anteroseptal apical defect consistent with breast attenuation.   OVARIAN CYST REMOVAL  1989,1980   ROTATOR CUFF REPAIR     left  TOTAL HIP ARTHROPLASTY  06/2010   right   TOTAL HIP ARTHROPLASTY Left 03/22/2015   Procedure: LEFT TOTAL HIP ARTHROPLASTY ANTERIOR APPROACH;  Surgeon: Gaynelle Arabian, MD;  Location: [facility];  Service: Orthopedics;  Laterality: Left;   TRANSTHORACIC ECHOCARDIOGRAM  05/31/2019   EF 60-65%. Gr 1 DD (normal for Age). Normal valves & chamber sizes.     reports that she quit smoking about 12 years ago. Her smoking use included cigarettes. She has a 38.00 pack-year smoking history. She has never used smokeless tobacco. She reports current alcohol use. She reports that she does not use drugs. family history includes AAA (abdominal aortic aneurysm) in her father; Alcoholism in her mother; Alzheimer's disease in her father; Anxiety disorder in her mother; Breast cancer in her sister; CAD (age of onset: 42) in her mother; Heart disease  in her father and mother; High Cholesterol in her father and mother; High blood pressure in her father and mother; Stroke in her mother; Thyroid disease in her mother. Allergies  Allergen Reactions   Augmentin [Amoxicillin-Pot Clavulanate] Nausea And Vomiting   Codeine Other (See Comments)    Causes her to stay awake    Levofloxacin Other (See Comments)    Severe joint and muscle pain   Other Hives and Other (See Comments)    IV contrast    Iodinated Contrast Media Rash   Current Outpatient Medications on File Prior to Visit  Medication Sig Dispense Refill   albuterol (VENTOLIN HFA) 108 (90 Base) MCG/ACT inhaler Inhale 2 puffs into the lungs every 6 (six) hours as needed for wheezing or shortness of breath. 18 g 5   aspirin EC 81 MG tablet Take 81 mg by mouth daily.     clobetasol cream (TEMOVATE) 0.05 % Apply twice daily to eczema up to 2 weeks/month as needed. 60 g 3   Coenzyme Q10 (CO Q-10) 300 MG CAPS 1 capsule     ergocalciferol (VITAMIN D2) 1.25 MG (50000 UT) capsule Take 1 capsule by mouth once a week 24 capsule 0   fluticasone (FLONASE) 50 MCG/ACT nasal spray Administer 2 sprays into affected nostril at bedtime. 16 g 11   hydrochlorothiazide (HYDRODIURIL) 25 MG tablet Take 1 tablet by mouth once daily in the morning. 90 tablet 0   Magnesium 400 MG TABS Take 400 mg by mouth.     Naproxen Sodium (ALEVE PO) Take 400 mg by mouth as directed.     propranolol ER (INDERAL LA) 60 MG 24 hr capsule Take 1 capsule by mouth once daily. 90 capsule 0   rosuvastatin (CRESTOR) 5 MG tablet Take 1 tablet by mouth once a day. 90 tablet 1   salmeterol (SEREVENT) 50 MCG/ACT diskus inhaler Inhale 1 puff into the lungs in the morning and at bedtime. 180 each 1   No current facility-administered medications on file prior to visit.        ROS:  All others reviewed and negative.  Objective        PE:  BP 122/68 (BP Location: Right Arm, Patient Position: Sitting, Cuff Size: Large)   Pulse 72   Temp  97.8 F (36.6 C) (Oral)   Ht '5\' 7"'$  (1.702 m)   Wt 223 lb (101.2 kg)   SpO2 95%   BMI 34.93 kg/m                 Constitutional: Pt appears in NAD               HENT: Head:  NCAT.                Right Ear: External ear normal.                 Left Ear: External ear normal. Bilat tm's with mild erythema.  Max sinus areas non tender.  Pharynx with mild erythema, no exudate               Eyes: . Pupils are equal, round, and reactive to light. Conjunctivae and EOM are normal               Nose: without d/c or deformity               Neck: Neck supple. Gross normal ROM               Cardiovascular: Normal rate and regular rhythm.                 Pulmonary/Chest: Effort normal and breath sounds without rales or wheezing.                Abd:  Soft, NT, ND, + BS, no organomegaly               Neurological: Pt is alert. At baseline orientation, motor grossly intact               Skin: Skin is warm. No rashes, no other new lesions, LE edema - none               Psychiatric: Pt behavior is normal without agitation   Micro: none  Cardiac tracings I have personally interpreted today:  none  Pertinent Radiological findings (summarize): none   Lab Results  Component Value Date   WBC 11.6 (H) 03/23/2015   HGB 11.2 (L) 03/23/2015   HCT 33.1 (L) 03/23/2015   PLT 228 03/23/2015   GLUCOSE 91 04/23/2021   CHOL 152 04/23/2021   TRIG 147 04/23/2021   HDL 48 04/23/2021   LDLCALC 78 04/23/2021   ALT 7 04/23/2021   AST 13 04/23/2021   NA 139 04/23/2021   K 5.0 04/23/2021   CL 97 04/23/2021   CREATININE 0.75 04/23/2021   BUN 15 04/23/2021   CO2 29 04/23/2021   INR 1.01 03/15/2015   HGBA1C 6.1 (H) 04/23/2021   Assessment/Plan:  Annette [name] is a 71 y.o. White or Caucasian [1] female with  has a past medical history of Arthritis, Back pain, COPD (chronic obstructive pulmonary disease) ([place]), Coronary artery calcification seen on computed tomography (02/2018), Difficulty sleeping, Edema of both  lower extremities, Emphysema of lung ([place]), Fluid retention, GERD (gastroesophageal reflux disease), Hemorrhoids, Hyperlipidemia, Hypertension, Hypothyroidism, IBS (irritable bowel syndrome), Irritable bowel syndrome, Joint pain, Macular degeneration, Osteopenia, Shingles (2004), SOB (shortness of breath), Tremors of nervous system, Varicose veins, and Vitamin D deficiency.  Vertigo C/w peripheral positional related vertigo - for meclizine prn,  to f/u any worsening symptoms or concerns  Eustachian tube dysfunction, bilateral For mucinex 1200 mg bid prn,  to f/u any worsening symptoms or concerns  Allergic rhinitis Mild to mod, for start allegra 180 mg qd, nasacort asd,  to f/u any worsening symptoms or concerns  Acute recurrent pansinusitis No evidence for infection at this time, will hold on antibx at this time,  to f/u any worsening symptoms or concerns  Followup: Return if symptoms worsen or fail to improve.  Cathlean Cower, MD 02/01/2022 9:27 PM [facility] -  [facility]

## 2022-02-01 ENCOUNTER — Encounter: Payer: Self-pay | Admitting: Internal Medicine

## 2022-02-01 NOTE — Assessment & Plan Note (Signed)
No evidence for infection at this time, will hold on antibx at this time,  to f/u any worsening symptoms or concerns

## 2022-02-01 NOTE — Assessment & Plan Note (Signed)
C/w peripheral positional related vertigo - for meclizine prn,  to f/u any worsening symptoms or concerns

## 2022-02-01 NOTE — Assessment & Plan Note (Signed)
For mucinex 1200 mg bid prn,  to f/u any worsening symptoms or concerns

## 2022-02-01 NOTE — Assessment & Plan Note (Signed)
Mild to mod, for start allegra 180 mg qd, nasacort asd,  to f/u any worsening symptoms or concerns

## 2022-02-02 ENCOUNTER — Other Ambulatory Visit (HOSPITAL_COMMUNITY): Payer: Self-pay

## 2022-02-03 DIAGNOSIS — L298 Other pruritus: Secondary | ICD-10-CM | POA: Diagnosis not present

## 2022-02-03 DIAGNOSIS — D485 Neoplasm of uncertain behavior of skin: Secondary | ICD-10-CM | POA: Diagnosis not present

## 2022-02-03 DIAGNOSIS — Z789 Other specified health status: Secondary | ICD-10-CM | POA: Diagnosis not present

## 2022-02-03 DIAGNOSIS — R208 Other disturbances of skin sensation: Secondary | ICD-10-CM | POA: Diagnosis not present

## 2022-02-03 DIAGNOSIS — L820 Inflamed seborrheic keratosis: Secondary | ICD-10-CM | POA: Diagnosis not present

## 2022-02-03 DIAGNOSIS — L821 Other seborrheic keratosis: Secondary | ICD-10-CM | POA: Diagnosis not present

## 2022-02-03 DIAGNOSIS — L570 Actinic keratosis: Secondary | ICD-10-CM | POA: Diagnosis not present

## 2022-02-03 DIAGNOSIS — L538 Other specified erythematous conditions: Secondary | ICD-10-CM | POA: Diagnosis not present

## 2022-02-13 ENCOUNTER — Other Ambulatory Visit (HOSPITAL_COMMUNITY): Payer: Self-pay

## 2022-02-13 ENCOUNTER — Other Ambulatory Visit: Payer: Self-pay | Admitting: Acute Care

## 2022-02-16 ENCOUNTER — Telehealth: Payer: Self-pay | Admitting: Internal Medicine

## 2022-02-16 ENCOUNTER — Other Ambulatory Visit: Payer: Self-pay | Admitting: Internal Medicine

## 2022-02-16 ENCOUNTER — Other Ambulatory Visit (HOSPITAL_COMMUNITY): Payer: Self-pay

## 2022-02-16 ENCOUNTER — Other Ambulatory Visit: Payer: Self-pay | Admitting: Acute Care

## 2022-02-16 MED ORDER — LEVOTHYROXINE SODIUM 125 MCG PO TABS
125.0000 ug | ORAL_TABLET | Freq: Every day | ORAL | 3 refills | Status: DC
  Filled 2022-02-16: qty 90, 90d supply, fill #0

## 2022-02-16 MED ORDER — HYDROCHLOROTHIAZIDE 25 MG PO TABS
25.0000 mg | ORAL_TABLET | Freq: Every morning | ORAL | 0 refills | Status: DC
  Filled 2022-02-16: qty 90, 90d supply, fill #0

## 2022-02-16 MED ORDER — PROPRANOLOL HCL ER 60 MG PO CP24
60.0000 mg | ORAL_CAPSULE | Freq: Every day | ORAL | 0 refills | Status: DC
  Filled 2022-02-16: qty 90, 90d supply, fill #0

## 2022-02-16 MED ORDER — SEREVENT DISKUS 50 MCG/ACT IN AEPB
1.0000 | INHALATION_SPRAY | Freq: Two times a day (BID) | RESPIRATORY_TRACT | 0 refills | Status: DC
  Filled 2022-02-16: qty 180, 90d supply, fill #0

## 2022-02-16 NOTE — Addendum Note (Signed)
Addended by: Thomes Cake on: 02/16/2022 03:59 PM   Modules accepted: Orders

## 2022-02-16 NOTE — Telephone Encounter (Signed)
Patient has called back stating that she is completely out of the medication and would like refill sent as soon as possible.

## 2022-02-16 NOTE — Telephone Encounter (Signed)
Pt requesting refill on Synthroid it was cancelled but pt stated she cant go without it last rx 11/10/21   Please Advise

## 2022-02-16 NOTE — Telephone Encounter (Signed)
Disregard sent to wrong provider

## 2022-02-16 NOTE — Telephone Encounter (Signed)
Refill has been sent to the patient's pharmacy.  

## 2022-02-16 NOTE — Telephone Encounter (Signed)
Caller & Relationship to patient:self    Call back EVQWQV:[phone]   Date of last office visit:5.25.2023   Date of next office visit:   Medication(s) to be refilled: levothyroxine (SYNTHROID) 125 MCG tablet [[id number]]       Preferred [facility]: [facility]

## 2022-02-18 ENCOUNTER — Other Ambulatory Visit (HOSPITAL_COMMUNITY): Payer: Self-pay

## 2022-02-19 ENCOUNTER — Other Ambulatory Visit (HOSPITAL_COMMUNITY): Payer: Self-pay

## 2022-02-20 ENCOUNTER — Other Ambulatory Visit (HOSPITAL_COMMUNITY): Payer: Self-pay

## 2022-02-26 DIAGNOSIS — H353132 Nonexudative age-related macular degeneration, bilateral, intermediate dry stage: Secondary | ICD-10-CM | POA: Diagnosis not present

## 2022-02-26 DIAGNOSIS — H26491 Other secondary cataract, right eye: Secondary | ICD-10-CM | POA: Diagnosis not present

## 2022-02-26 DIAGNOSIS — H43393 Other vitreous opacities, bilateral: Secondary | ICD-10-CM | POA: Diagnosis not present

## 2022-02-26 DIAGNOSIS — H524 Presbyopia: Secondary | ICD-10-CM | POA: Diagnosis not present

## 2022-02-26 DIAGNOSIS — H04123 Dry eye syndrome of bilateral lacrimal glands: Secondary | ICD-10-CM | POA: Diagnosis not present

## 2022-02-26 DIAGNOSIS — H35033 Hypertensive retinopathy, bilateral: Secondary | ICD-10-CM | POA: Diagnosis not present

## 2022-02-26 DIAGNOSIS — H35013 Changes in retinal vascular appearance, bilateral: Secondary | ICD-10-CM | POA: Diagnosis not present

## 2022-03-02 ENCOUNTER — Other Ambulatory Visit (HOSPITAL_COMMUNITY): Payer: Self-pay

## 2022-03-03 DIAGNOSIS — L820 Inflamed seborrheic keratosis: Secondary | ICD-10-CM | POA: Diagnosis not present

## 2022-03-03 DIAGNOSIS — L570 Actinic keratosis: Secondary | ICD-10-CM | POA: Diagnosis not present

## 2022-03-04 ENCOUNTER — Other Ambulatory Visit (HOSPITAL_COMMUNITY): Payer: Self-pay

## 2022-03-04 ENCOUNTER — Encounter: Payer: Self-pay | Admitting: Internal Medicine

## 2022-03-04 ENCOUNTER — Ambulatory Visit (INDEPENDENT_AMBULATORY_CARE_PROVIDER_SITE_OTHER): Payer: PPO | Admitting: Internal Medicine

## 2022-03-04 DIAGNOSIS — H60393 Other infective otitis externa, bilateral: Secondary | ICD-10-CM | POA: Diagnosis not present

## 2022-03-04 DIAGNOSIS — R42 Dizziness and giddiness: Secondary | ICD-10-CM

## 2022-03-04 MED ORDER — NEOMYCIN-POLYMYXIN-HC 3.5-10000-1 OT SUSP
3.0000 [drp] | Freq: Three times a day (TID) | OTIC | 0 refills | Status: DC
  Filled 2022-03-04: qty 10, 10d supply, fill #0

## 2022-03-04 NOTE — Progress Notes (Signed)
   Subjective:   Patient ID: Annette [name], female    DOB: 05/11/1951, 71 y.o.   MRN: [id number]  HPI The patient is a 71 YO female coming in for vertigo and ear problems.   Review of Systems  Constitutional: Negative.   HENT:  Positive for ear pain.   Eyes: Negative.   Respiratory:  Negative for cough, chest tightness and shortness of breath.   Cardiovascular:  Negative for chest pain, palpitations and leg swelling.  Gastrointestinal:  Negative for abdominal distention, abdominal pain, constipation, diarrhea, nausea and vomiting.  Musculoskeletal: Negative.   Skin: Negative.   Neurological:  Positive for dizziness.  Psychiatric/Behavioral: Negative.      Objective:  Physical Exam Constitutional:      Appearance: She is well-developed.  HENT:     Head: Normocephalic and atraumatic.     Ears:     Comments: Left ear canal with redness mild fluid TM clear, right ear canal with redness and bulging TM cloudy fluid Cardiovascular:     Rate and Rhythm: Normal rate and regular rhythm.  Pulmonary:     Effort: Pulmonary effort is normal. No respiratory distress.     Breath sounds: Normal breath sounds. No wheezing or rales.  Abdominal:     General: Bowel sounds are normal. There is no distension.     Palpations: Abdomen is soft.     Tenderness: There is no abdominal tenderness. There is no rebound.  Musculoskeletal:     Cervical back: Normal range of motion.  Skin:    General: Skin is warm and dry.  Neurological:     Mental Status: She is alert and oriented to person, place, and time.     Coordination: Coordination normal.     Vitals:   03/04/22 0759  BP: 116/80  Pulse: 60  Resp: 18  SpO2: 91%  Weight: 227 lb 12.8 oz (103.3 kg)  Height: '5\' 7"'$  (1.702 m)    Assessment & Plan:

## 2022-03-04 NOTE — Assessment & Plan Note (Signed)
Rx cortisporin ear drops to use 3 drops TID both ears 5 days. She will let us know if unimproved in 2-3 days.

## 2022-03-04 NOTE — Patient Instructions (Signed)
We have sent in the ear drops to use 3 drops both ears 3 times a day for 5 days.

## 2022-03-04 NOTE — Assessment & Plan Note (Signed)
She is using meclizine rarely 12.5 mg prn. Advised okay to continue with this. Likely related to her otitis externa which we are treating today. Overall improving from last month.

## 2022-03-10 ENCOUNTER — Other Ambulatory Visit (HOSPITAL_COMMUNITY): Payer: Self-pay

## 2022-03-10 ENCOUNTER — Ambulatory Visit (HOSPITAL_COMMUNITY): Payer: Self-pay

## 2022-03-10 ENCOUNTER — Ambulatory Visit
Admission: RE | Admit: 2022-03-10 | Discharge: 2022-03-10 | Disposition: A | Discharge status: Home / Self Care | Payer: PPO | Source: Ambulatory Visit | Attending: Emergency Medicine | Admitting: Emergency Medicine

## 2022-03-10 VITALS — BP 138/82 | HR 62 | Temp 98.2°F | Resp 18

## 2022-03-10 DIAGNOSIS — R8290 Unspecified abnormal findings in urine: Secondary | ICD-10-CM | POA: Diagnosis not present

## 2022-03-10 DIAGNOSIS — M545 Low back pain, unspecified: Secondary | ICD-10-CM

## 2022-03-10 DIAGNOSIS — N390 Urinary tract infection, site not specified: Secondary | ICD-10-CM

## 2022-03-10 LAB — POCT URINALYSIS DIP (MANUAL ENTRY)
Bilirubin, UA: NEGATIVE
Glucose, UA: NEGATIVE mg/dL
Ketones, POC UA: NEGATIVE mg/dL
Nitrite, UA: POSITIVE — AB
Protein Ur, POC: 100 mg/dL — AB
Spec Grav, UA: 1.02 (ref 1.010–1.025)
Urobilinogen, UA: 0.2 E.U./dL
pH, UA: 6.5 (ref 5.0–8.0)

## 2022-03-10 MED ORDER — CEFTRIAXONE SODIUM 1 G IJ SOLR
1.0000 g | Freq: Once | INTRAMUSCULAR | Status: AC
  Administered 2022-03-10: 1 g via INTRAMUSCULAR

## 2022-03-10 MED ORDER — FLUCONAZOLE 150 MG PO TABS
ORAL_TABLET | ORAL | 0 refills | Status: DC
  Filled 2022-03-10: qty 2, 3d supply, fill #0

## 2022-03-10 MED ORDER — SULFAMETHOXAZOLE-TRIMETHOPRIM 800-160 MG PO TABS
1.0000 | ORAL_TABLET | Freq: Two times a day (BID) | ORAL | 0 refills | Status: AC
  Filled 2022-03-10: qty 10, 5d supply, fill #0

## 2022-03-10 NOTE — ED Provider Notes (Signed)
[facility]    CSN: [id number] Arrival date & time: 03/10/22  1415    HISTORY   Chief Complaint  Patient presents with   Urinary Frequency    UTI with cloudy urine - Entered by patient   HPI Annette [name] is a pleasant, 71 y.o. female who presents to urgent care today. Patient complains of a 2-day history of cloudy urine and progressively worsening burning with urination, increased frequency of urination.  Patient denies fever, aches, chills but states she is beginning to have lower back pain as well at this time.  Urine dip today was positive for nitrites.  Patient states has not had a urinary tract infection since she was 71 years old.  The history is provided by the patient.   Past Medical History:  Diagnosis Date   Arthritis    Right hip, end stage   Back pain    COPD (chronic obstructive pulmonary disease) (HCC)    Coronary artery calcification seen on computed tomography 02/2018   Coronary calcium score 79.  Coronary CT angiogram: Mild CAD and proximal LAD, proximal RCA and mid LCx.  Moderate plaque ostial LCx.  Mildly dilated pulmonary artery, suggestive of possible pulmonary hypertension   Difficulty sleeping    DUE TO PAIN   Edema of both lower extremities    Emphysema of lung (HCC)    Fluid retention    TAKES HCTZ   GERD (gastroesophageal reflux disease)    Hemorrhoids    Hyperlipidemia    Hypertension    Hypothyroidism    IBS (irritable bowel syndrome)    Irritable bowel syndrome    Joint pain    Macular degeneration    Osteopenia    Shingles 2004   SOB (shortness of breath)    Tremors of nervous system    TAKES PROPRANOLOL TO TX   Varicose veins    Vitamin D deficiency    Patient Active Problem List   Diagnosis Date Noted   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   Overweight 01/30/2022   Eustachian tube dysfunction, bilateral 01/30/2022   Vertigo 01/30/2022   Left hand pain 01/08/2022   Essential tremor 01/08/2022   Otitis externa 10/07/2021    Deviated septum 10/07/2021   Prediabetes 12/16/2020   Vitamin D insufficiency 12/16/2020   Hypothyroidism 12/16/2020   Hypertensive retinopathy 12/16/2020   Hypertensive heart disease without congestive heart failure 12/16/2020   Allergic rhinitis 12/05/2019   COPD (chronic obstructive pulmonary disease) ([place]) 10/16/2019   Palpitations 06/30/2019   Coronary artery calcification seen on computed tomography 12/13/2017   Hyperlipidemia due to dietary fat intake 12/13/2017   Internal hemorrhoids with prolapse and bleeding 12/21/2013   Past Surgical History:  Procedure Laterality Date   ABDOMINAL HYSTERECTOMY  2000   CATARACT EXTRACTION Right    CHOLECYSTECTOMY  1994   COLONOSCOPY     ERCP  2004   sludge in CBD (jaundiced)   ESOPHAGOGASTRODUODENOSCOPY     INCISE AND DRAIN ABCESS Left 05/2016   wrist   INCISION / DRAINAGE HAND / FINGER     LEFT INDEX FINGER   JOINT REPLACEMENT     Lexiscan Myoview  06/2019   Lexiscan Myoview 07/05/2019: LOW RISK.  EF 60 to 65%.  Fixed anteroseptal apical defect consistent with breast attenuation.   OVARIAN CYST REMOVAL  0737,1062   ROTATOR CUFF REPAIR     left   TOTAL HIP ARTHROPLASTY  06/2010   right   TOTAL HIP ARTHROPLASTY Left 03/22/2015   Procedure: LEFT  TOTAL HIP ARTHROPLASTY ANTERIOR APPROACH;  Surgeon: Gaynelle Arabian, MD;  Location: [facility];  Service: Orthopedics;  Laterality: Left;   TRANSTHORACIC ECHOCARDIOGRAM  05/31/2019   EF 60-65%. Gr 1 DD (normal for Age). Normal valves & chamber sizes.    OB History     Gravida  1   Para  1   Term      Preterm      AB      Living         SAB      IAB      Ectopic      Multiple      Live Births             Home Medications    Prior to Admission medications   Medication Sig Start Date End Date Taking? Authorizing Provider  albuterol (VENTOLIN HFA) 108 (90 Base) MCG/ACT inhaler Inhale 2 puffs into the lungs every 6 (six) hours as needed for wheezing or shortness of breath.  10/16/19   Collene Gobble, MD  aspirin EC 81 MG tablet Take 81 mg by mouth daily.    [provider]  clobetasol cream (TEMOVATE) 0.05 % Apply twice daily to eczema up to 2 weeks/month as needed. 09/16/21     Coenzyme Q10 (CO Q-10) 300 MG CAPS 1 capsule    [provider]  ergocalciferol (VITAMIN D2) 1.25 MG (50000 UT) capsule Take 1 capsule by mouth once a week 12/10/21     fluticasone (FLONASE) 50 MCG/ACT nasal spray Administer 2 sprays into affected nostril at bedtime. 12/09/21     guaiFENesin (MUCINEX) 600 MG 12 hr tablet Take 2 tablets (1,200 mg total) by mouth 2 (two) times daily as needed. 01/30/22   Biagio Borg, MD  hydrochlorothiazide (HYDRODIURIL) 25 MG tablet Take 1 tablet by mouth once daily in the morning. 02/16/22     levothyroxine (SYNTHROID) 125 MCG tablet Take 1 tablet (125 mcg total) by mouth daily. 02/16/22   Hoyt Koch, MD  Magnesium 400 MG TABS Take 400 mg by mouth.    [provider]  meclizine (ANTIVERT) 12.5 MG tablet Take 1 tablet (12.5 mg total) by mouth 3 (three) times daily as needed for dizziness. 01/30/22 01/30/23  Biagio Borg, MD  Naproxen Sodium (ALEVE PO) Take 400 mg by mouth as directed.    [provider]  neomycin-polymyxin-hydrocortisone (CORTISPORIN) 3.5-10000-1 OTIC suspension Place 3 drops into both ears 3 (three) times daily. 03/04/22   Hoyt Koch, MD  propranolol ER (INDERAL LA) 60 MG 24 hr capsule Take 1 capsule by mouth once daily. 02/16/22     rosuvastatin (CRESTOR) 5 MG tablet Take 1 tablet by mouth once a day. 01/18/22     salmeterol (SEREVENT DISKUS) 50 MCG/ACT diskus inhaler Inhale 1 puff into the lungs in the morning and at bedtime. 02/16/22   Magdalen Spatz, NP    Family History Family History  Problem Relation Age of Onset   Alzheimer's disease Father        developed CAD late in life   High blood pressure Father    High Cholesterol Father    Heart disease Father    AAA (abdominal aortic  aneurysm) Father    Stroke Mother        died @ ~73 y/o   CAD Mother 34       s/p CABG   High blood pressure Mother    High Cholesterol Mother    Heart  disease Mother    Thyroid disease Mother    Anxiety disorder Mother    Alcoholism Mother    Breast cancer Sister    Social History Social History   Tobacco Use   Smoking status: Former    Packs/day: 1.00    Years: 38.00    Total pack years: 38.00    Types: Cigarettes    Quit date: 03/14/2009    Years since quitting: 12.9   Smokeless tobacco: Never  Substance Use Topics   Alcohol use: Yes    Comment: beer every 6-8 months   Drug use: No   Allergies   Augmentin [amoxicillin-pot clavulanate], Codeine, Levofloxacin, Other, and Iodinated contrast media  Review of Systems Review of Systems Pertinent findings revealed after performing a 14 point review of systems has been noted in the history of present illness.  Physical Exam Triage Vital Signs ED Triage Vitals  Enc Vitals Group     BP 06/13/21 0827 (!) 147/82     Pulse Rate 06/13/21 0827 72     Resp 06/13/21 0827 18     Temp 06/13/21 0827 98.3 F (36.8 C)     Temp Source 06/13/21 0827 Oral     SpO2 06/13/21 0827 98 %     Weight --      Height --      Head Circumference --      Peak Flow --      Pain Score 06/13/21 0826 5     Pain Loc --      Pain Edu? --      Excl. in [place]? --   No data found.  Updated Vital Signs BP 138/82 (BP Location: Right Arm)   Pulse 62   Temp 98.2 F (36.8 C) (Oral)   Resp 18   SpO2 93%   Physical Exam Vitals and nursing note reviewed.  Constitutional:      General: She is not in acute distress.    Appearance: Normal appearance. She is not ill-appearing.  HENT:     Head: Normocephalic and atraumatic.  Eyes:     General: Lids are normal.        Right eye: No discharge.        Left eye: No discharge.     Extraocular Movements: Extraocular movements intact.     Conjunctiva/sclera: Conjunctivae normal.     Right eye: Right  conjunctiva is not injected.     Left eye: Left conjunctiva is not injected.  Neck:     Trachea: Trachea and phonation normal.  Cardiovascular:     Rate and Rhythm: Normal rate and regular rhythm.     Pulses: Normal pulses.     Heart sounds: Normal heart sounds. No murmur heard.    No friction rub. No gallop.  Pulmonary:     Effort: Pulmonary effort is normal. No accessory muscle usage, prolonged expiration or respiratory distress.     Breath sounds: Normal breath sounds. No stridor, decreased air movement or transmitted upper airway sounds. No decreased breath sounds, wheezing, rhonchi or rales.  Chest:     Chest wall: No tenderness.  Abdominal:     General: Abdomen is flat. Bowel sounds are normal. There is no distension.     Palpations: Abdomen is soft.     Tenderness: There is abdominal tenderness in the suprapubic area. There is no right CVA tenderness or left CVA tenderness.     Hernia: No hernia is present.  Musculoskeletal:  General: Normal range of motion.     Cervical back: Normal range of motion and neck supple. Normal range of motion.  Lymphadenopathy:     Cervical: No cervical adenopathy.  Skin:    General: Skin is warm and dry.     Findings: No erythema or rash.  Neurological:     General: No focal deficit present.     Mental Status: She is alert and oriented to person, place, and time.  Psychiatric:        Mood and Affect: Mood normal.        Behavior: Behavior normal.     Visual Acuity Right Eye Distance:   Left Eye Distance:   Bilateral Distance:    Right Eye Near:   Left Eye Near:    Bilateral Near:     UC Couse / Diagnostics / Procedures:     Radiology No results found.  Procedures Procedures (including critical care time) EKG  Pending results:  Labs Reviewed  POCT URINALYSIS DIP (MANUAL ENTRY) - Abnormal; Notable for the following components:      Result Value   Clarity, UA cloudy (*)    Blood, UA large (*)    Protein Ur, POC =100  (*)    Nitrite, UA Positive (*)    Leukocytes, UA Large (3+) (*)    All other components within normal limits  URINE CULTURE    Medications Ordered in UC: Medications  cefTRIAXone (ROCEPHIN) injection 1 g (has no administration in time range)    UC Diagnoses / Final Clinical Impressions(s)   I have reviewed the triage vital signs and the nursing notes.  Pertinent labs & imaging results that were available during my care of the patient were reviewed by me and considered in my medical decision making (see chart for details).    Final diagnoses:  Cloudy urine  Acute urinary tract infection  Acute low back pain without sciatica, unspecified back pain laterality      Patient was advised to begin antibiotics now due to findings on urine dip. Patient was advised to take all doses exactly as prescribed.  Patient also advised of risks of worsening infection with incomplete antibiotic therapy. Patient advised that they will be contacted with results and that adjustments to treatment will be provided as indicated based on the results.   Patient was advised of possibility that urine culture results may be negative if sample provided was obtained late in the day causing urine to be more diluted.  Patient was advised that if antibiotics were effective after the first 24 to 36 hours, despite negative urine culture result, it is recommended that they complete the full course as prescribed.   Diflucan prescribed for inevitable vaginal yeast infection caused by antibiotic therapy. Return precautions advised.  ED Prescriptions     Medication Sig Dispense Auth. Provider   sulfamethoxazole-trimethoprim (BACTRIM DS) 800-160 MG tablet Take 1 tablet by mouth 2 (two) times daily for 5 days. 10 tablet Lynden Oxford Scales, PA-C   fluconazole (DIFLUCAN) 150 MG tablet Take 1 tablet today.  Take second tablet 3 days later. 2 tablet Lynden Oxford Scales, PA-C      PDMP not reviewed this  encounter.  Disposition Upon Discharge:  Condition: stable for discharge home  Patient presented with concern for an acute illness with associated systemic symptoms and significant discomfort requiring urgent management. In my opinion, this is a condition that a prudent lay person (someone who possesses an average knowledge of health and medicine) may  potentially expect to result in complications if not addressed urgently such as respiratory distress, impairment of bodily function or dysfunction of bodily organs.   As such, the patient has been evaluated and assessed, work-up was performed and treatment was provided in alignment with urgent care protocols and evidence based medicine.  Patient/parent/caregiver has been advised that the patient may require follow up for further testing and/or treatment if the symptoms continue in spite of treatment, as clinically indicated and appropriate.  Routine symptom specific, illness specific and/or disease specific instructions were discussed with the patient and/or caregiver at length.  Prevention strategies for avoiding STD exposure were also discussed.  The patient will follow up with their current PCP if and as advised. If the patient does not currently have a PCP we will assist them in obtaining one.   The patient may need specialty follow up if the symptoms continue, in spite of conservative treatment and management, for further workup, evaluation, consultation and treatment as clinically indicated and appropriate.  Patient/parent/caregiver verbalized understanding and agreement of plan as discussed.  All questions were addressed during visit.  Please see discharge instructions below for further details of plan.  Discharge Instructions:   Discharge Instructions      The urinalysis that we performed in the clinic today was abnormal.  Urine culture will be performed per our protocol.  The result of the urine culture will be available in the next 3 to  5 days and will be posted to your MyChart account.  If there is an abnormal finding, you will be contacted by phone and advised of further treatment recommendations, if any.   You were advised to begin antibiotics today because your urinalysis is abnormal and you are having active symptoms of an acute urinary tract infection.  It is very important that you take all doses exactly as prescribed.  Incomplete antibiotic therapy can cause worsening urinary tract infection that can become aggressive, escape from urinary tract into your bloodstream causing sepsis which will require hospitalization.  You received an injection of a strong antibiotic called ceftriaxone during your today to help expedite resolution of your urinary tract infection.   Please pick up and begin taking your prescription for Bactrim DS (trimethoprim sulfamethoxazole) as soon as possible.  Please take all doses exactly as prescribed.  You can take this medication with or without food.  This medication is safe to take with your other medications.   If you receive a phone call advising you that your urine culture is negative but you begin to feel better after taking antibiotics for 24 hours, please feel free to complete the full course of antibiotics as they were likely needed and the urine culture result was false.  If your culture is negative and you do not feel any better, please return for repeat evaluation of other possible causes of your symptoms.   Because we know that antibiotic treatment can often cause vaginal yeast infections, I have also provided you with a prescription for fluconazole (Diflucan).  Please take the first tablet the day you begin to experience symptoms of vaginal yeast infection which include vaginal itching and thick white vaginal discharge.  Please take the second tablet three days after the first tablet to ensure resolution of the yeast infection   If you have not had complete resolution of your symptoms after  completing treatment as prescribed, please return to urgent care for repeat evaluation or follow-up with your primary care provider.   Thank you for visiting  urgent care today.  I appreciate the opportunity to participate in your care.       This office note has been dictated using Museum/gallery curator.  Unfortunately, this method of dictation can sometimes lead to typographical or grammatical errors.  I apologize for your inconvenience in advance if this occurs.  Please do not hesitate to reach out to me if clarification is needed.       Lynden Oxford Scales, PA-C 03/10/22 1510

## 2022-03-10 NOTE — ED Triage Notes (Signed)
The patient c/o cloudy urine, urinary frequency, burning with urination, fever and chills that began Sunday.  Home interventions: none

## 2022-03-10 NOTE — Discharge Instructions (Signed)
The urinalysis that we performed in the clinic today was abnormal.  Urine culture will be performed per our protocol.  The result of the urine culture will be available in the next 3 to 5 days and will be posted to your MyChart account.  If there is an abnormal finding, you will be contacted by phone and advised of further treatment recommendations, if any.   You were advised to begin antibiotics today because your urinalysis is abnormal and you are having active symptoms of an acute urinary tract infection.  It is very important that you take all doses exactly as prescribed.  Incomplete antibiotic therapy can cause worsening urinary tract infection that can become aggressive, escape from urinary tract into your bloodstream causing sepsis which will require hospitalization.  You received an injection of a strong antibiotic called ceftriaxone during your today to help expedite resolution of your urinary tract infection.   Please pick up and begin taking your prescription for Bactrim DS (trimethoprim sulfamethoxazole) as soon as possible.  Please take all doses exactly as prescribed.  You can take this medication with or without food.  This medication is safe to take with your other medications.   If you receive a phone call advising you that your urine culture is negative but you begin to feel better after taking antibiotics for 24 hours, please feel free to complete the full course of antibiotics as they were likely needed and the urine culture result was false.  If your culture is negative and you do not feel any better, please return for repeat evaluation of other possible causes of your symptoms.   Because we know that antibiotic treatment can often cause vaginal yeast infections, I have also provided you with a prescription for fluconazole (Diflucan).  Please take the first tablet the day you begin to experience symptoms of vaginal yeast infection which include vaginal itching and thick white vaginal  discharge.  Please take the second tablet three days after the first tablet to ensure resolution of the yeast infection   If you have not had complete resolution of your symptoms after completing treatment as prescribed, please return to urgent care for repeat evaluation or follow-up with your primary care provider.   Thank you for visiting urgent care today.  I appreciate the opportunity to participate in your care.

## 2022-03-12 LAB — URINE CULTURE: Culture: >=100000 — AB

## 2022-03-16 ENCOUNTER — Other Ambulatory Visit (HOSPITAL_COMMUNITY): Payer: Self-pay

## 2022-03-25 ENCOUNTER — Encounter (INDEPENDENT_AMBULATORY_CARE_PROVIDER_SITE_OTHER): Payer: Self-pay

## 2022-03-27 ENCOUNTER — Ambulatory Visit: Payer: PPO | Admitting: Internal Medicine

## 2022-03-31 ENCOUNTER — Other Ambulatory Visit (HOSPITAL_COMMUNITY): Payer: Self-pay

## 2022-04-16 ENCOUNTER — Encounter (INDEPENDENT_AMBULATORY_CARE_PROVIDER_SITE_OTHER): Payer: Self-pay

## 2022-04-16 ENCOUNTER — Other Ambulatory Visit (HOSPITAL_COMMUNITY): Payer: Self-pay

## 2022-04-28 DIAGNOSIS — L820 Inflamed seborrheic keratosis: Secondary | ICD-10-CM | POA: Diagnosis not present

## 2022-05-12 ENCOUNTER — Other Ambulatory Visit (HOSPITAL_COMMUNITY): Payer: Self-pay

## 2022-05-12 DIAGNOSIS — I119 Hypertensive heart disease without heart failure: Secondary | ICD-10-CM | POA: Diagnosis not present

## 2022-05-12 DIAGNOSIS — G479 Sleep disorder, unspecified: Secondary | ICD-10-CM | POA: Diagnosis not present

## 2022-05-12 DIAGNOSIS — I2510 Atherosclerotic heart disease of native coronary artery without angina pectoris: Secondary | ICD-10-CM | POA: Diagnosis not present

## 2022-05-12 DIAGNOSIS — E7800 Pure hypercholesterolemia, unspecified: Secondary | ICD-10-CM | POA: Diagnosis not present

## 2022-05-12 DIAGNOSIS — E039 Hypothyroidism, unspecified: Secondary | ICD-10-CM | POA: Diagnosis not present

## 2022-05-12 MED ORDER — LEVOTHYROXINE SODIUM 125 MCG PO TABS
125.0000 ug | ORAL_TABLET | Freq: Every morning | ORAL | 3 refills | Status: DC
  Filled 2022-05-12 (×2): qty 90, 90d supply, fill #0
  Filled 2022-08-16: qty 90, 90d supply, fill #1

## 2022-05-12 MED ORDER — PROPRANOLOL HCL ER 60 MG PO CP24
60.0000 mg | ORAL_CAPSULE | Freq: Every day | ORAL | 3 refills | Status: DC
  Filled 2022-05-12: qty 90, 90d supply, fill #0
  Filled 2022-08-16: qty 90, 90d supply, fill #1
  Filled 2022-11-14: qty 90, 90d supply, fill #2
  Filled 2023-02-15: qty 90, 90d supply, fill #3

## 2022-05-12 MED ORDER — ALPRAZOLAM 0.25 MG PO TABS
0.2500 mg | ORAL_TABLET | Freq: Every evening | ORAL | 0 refills | Status: DC | PRN
  Filled 2022-05-12: qty 20, 20d supply, fill #0

## 2022-05-12 MED ORDER — HYDROCHLOROTHIAZIDE 25 MG PO TABS
25.0000 mg | ORAL_TABLET | Freq: Every morning | ORAL | 3 refills | Status: DC
  Filled 2022-05-12: qty 90, 90d supply, fill #0
  Filled 2022-08-16: qty 90, 90d supply, fill #1

## 2022-05-13 ENCOUNTER — Other Ambulatory Visit (HOSPITAL_COMMUNITY): Payer: Self-pay

## 2022-05-14 ENCOUNTER — Other Ambulatory Visit (HOSPITAL_COMMUNITY): Payer: Self-pay

## 2022-05-14 ENCOUNTER — Ambulatory Visit (HOSPITAL_COMMUNITY)
Admission: RE | Admit: 2022-05-14 | Discharge: 2022-05-14 | Disposition: A | Discharge status: Home / Self Care | Payer: PPO | Source: Ambulatory Visit | Attending: Internal Medicine | Admitting: Internal Medicine

## 2022-05-14 DIAGNOSIS — Z87891 Personal history of nicotine dependence: Secondary | ICD-10-CM | POA: Insufficient documentation

## 2022-05-15 ENCOUNTER — Other Ambulatory Visit (HOSPITAL_COMMUNITY): Payer: Self-pay

## 2022-05-18 ENCOUNTER — Other Ambulatory Visit: Payer: Self-pay | Admitting: Acute Care

## 2022-05-18 DIAGNOSIS — Z87891 Personal history of nicotine dependence: Secondary | ICD-10-CM

## 2022-05-18 DIAGNOSIS — Z122 Encounter for screening for malignant neoplasm of respiratory organs: Secondary | ICD-10-CM

## 2022-05-19 ENCOUNTER — Other Ambulatory Visit (HOSPITAL_COMMUNITY): Payer: Self-pay

## 2022-05-19 DIAGNOSIS — Z01419 Encounter for gynecological examination (general) (routine) without abnormal findings: Secondary | ICD-10-CM | POA: Diagnosis not present

## 2022-05-19 DIAGNOSIS — Z1231 Encounter for screening mammogram for malignant neoplasm of breast: Secondary | ICD-10-CM | POA: Diagnosis not present

## 2022-05-19 LAB — HM MAMMOGRAPHY

## 2022-05-19 MED ORDER — VALACYCLOVIR HCL 500 MG PO TABS
500.0000 mg | ORAL_TABLET | Freq: Every day | ORAL | 2 refills | Status: DC
  Filled 2022-05-19: qty 30, 30d supply, fill #0
  Filled 2022-07-13: qty 30, 30d supply, fill #1
  Filled 2022-08-16: qty 30, 30d supply, fill #2

## 2022-05-22 ENCOUNTER — Encounter: Payer: Self-pay | Admitting: Internal Medicine

## 2022-05-22 ENCOUNTER — Ambulatory Visit (INDEPENDENT_AMBULATORY_CARE_PROVIDER_SITE_OTHER): Payer: PPO | Admitting: Internal Medicine

## 2022-05-22 VITALS — BP 118/76 | HR 66 | Temp 98.3°F | Ht 67.0 in | Wt 225.0 lb

## 2022-05-22 DIAGNOSIS — R7303 Prediabetes: Secondary | ICD-10-CM | POA: Diagnosis not present

## 2022-05-22 DIAGNOSIS — I2721 Secondary pulmonary arterial hypertension: Secondary | ICD-10-CM

## 2022-05-22 LAB — COMPREHENSIVE METABOLIC PANEL
ALT: 13 U/L (ref 0–35)
AST: 18 U/L (ref 0–37)
Albumin: 4.1 g/dL (ref 3.5–5.2)
Alkaline Phosphatase: 98 U/L (ref 39–117)
BUN: 16 mg/dL (ref 6–23)
CO2: 35 mEq/L — ABNORMAL HIGH (ref 19–32)
Calcium: 9.8 mg/dL (ref 8.4–10.5)
Chloride: 97 mEq/L (ref 96–112)
Creatinine, Ser: 0.75 mg/dL (ref 0.40–1.20)
GFR: 79.32 mL/min (ref >60.00)
Glucose, Bld: 102 mg/dL — ABNORMAL HIGH (ref 70–99)
Potassium: 4.3 mEq/L (ref 3.5–5.1)
Sodium: 137 mEq/L (ref 135–145)
Total Bilirubin: 0.3 mg/dL (ref 0.2–1.2)
Total Protein: 7.6 g/dL (ref 6.0–8.3)

## 2022-05-22 LAB — LIPID PANEL
Cholesterol: 154 mg/dL (ref 0–200)
HDL: 48.8 mg/dL (ref >39.00)
LDL Cholesterol: 78 mg/dL (ref 0–99)
NonHDL: 105.12
Total CHOL/HDL Ratio: 3
Triglycerides: 137 mg/dL (ref 0.0–149.0)
VLDL: 27.4 mg/dL (ref 0.0–40.0)

## 2022-05-22 LAB — HEMOGLOBIN A1C: Hgb A1c MFr Bld: 6 % (ref 4.6–6.5)

## 2022-05-22 LAB — CBC
HCT: 40.1 % (ref 36.0–46.0)
Hemoglobin: 13.6 g/dL (ref 12.0–15.0)
MCHC: 33.9 g/dL (ref 30.0–36.0)
MCV: 87 fl (ref 78.0–100.0)
Platelets: 327 10*3/uL (ref 150.0–400.0)
RBC: 4.61 Mil/uL (ref 3.87–5.11)
RDW: 13.9 % (ref 11.5–15.5)
WBC: 9.1 10*3/uL (ref 4.0–10.5)

## 2022-05-22 NOTE — Progress Notes (Signed)
   Subjective:   Patient ID: Annette [name], female    DOB: 1951/02/22, 71 y.o.   MRN: [id number]  HPI The patient is a 71 YO female coming in for new pulmonary hypertension on CT scan. Some SOB on exertion worsening in the last few months.  Review of Systems  Constitutional: Negative.   HENT: Negative.    Eyes: Negative.   Respiratory:  Positive for shortness of breath. Negative for cough and chest tightness.   Cardiovascular:  Negative for chest pain, palpitations and leg swelling.  Gastrointestinal:  Negative for abdominal distention, abdominal pain, constipation, diarrhea, nausea and vomiting.  Musculoskeletal: Negative.   Skin: Negative.   Neurological: Negative.   Psychiatric/Behavioral: Negative.      Objective:  Physical Exam Constitutional:      Appearance: She is well-developed.  HENT:     Head: Normocephalic and atraumatic.  Cardiovascular:     Rate and Rhythm: Normal rate and regular rhythm.  Pulmonary:     Effort: Pulmonary effort is normal. No respiratory distress.     Breath sounds: Normal breath sounds. No wheezing or rales.  Abdominal:     General: Bowel sounds are normal. There is no distension.     Palpations: Abdomen is soft.     Tenderness: There is no abdominal tenderness. There is no rebound.  Musculoskeletal:     Cervical back: Normal range of motion.  Skin:    General: Skin is warm and dry.  Neurological:     Mental Status: She is alert and oriented to person, place, and time.     Coordination: Coordination normal.     Vitals:   05/22/22 0954  BP: 118/76  Pulse: 66  Temp: 98.3 F (36.8 C)  SpO2: 95%  Weight: 225 lb (102.1 kg)  Height: '5\' 7"'$  (1.702 m)    Assessment & Plan:

## 2022-05-22 NOTE — Patient Instructions (Signed)
We have ordered the echo of the heart and will get you into cardiology.  We will check the labs as well today.

## 2022-05-23 ENCOUNTER — Other Ambulatory Visit (HOSPITAL_COMMUNITY): Payer: Self-pay

## 2022-05-23 DIAGNOSIS — I2721 Secondary pulmonary arterial hypertension: Secondary | ICD-10-CM | POA: Insufficient documentation

## 2022-05-23 NOTE — Assessment & Plan Note (Signed)
Ordered ECHO. Last without signs of pulmonary htn done 3 years ago. This finding new on CT scan for lung cancer screening done this year not present on scan from 2022. Referral to cardiology and we talked about possible need for cath depending on results. Checking lipid panel, CBC, CMP to assess.

## 2022-05-23 NOTE — Assessment & Plan Note (Signed)
Checking HgA1c since doing labs today and due.

## 2022-05-25 ENCOUNTER — Telehealth: Payer: Self-pay

## 2022-05-25 ENCOUNTER — Other Ambulatory Visit (HOSPITAL_COMMUNITY): Payer: Self-pay

## 2022-05-25 DIAGNOSIS — R319 Hematuria, unspecified: Secondary | ICD-10-CM | POA: Diagnosis not present

## 2022-05-25 DIAGNOSIS — N390 Urinary tract infection, site not specified: Secondary | ICD-10-CM | POA: Diagnosis not present

## 2022-05-25 MED ORDER — NITROFURANTOIN MONOHYD MACRO 100 MG PO CAPS
ORAL_CAPSULE | ORAL | 0 refills | Status: DC
  Filled 2022-05-25: qty 14, 7d supply, fill #0

## 2022-05-25 NOTE — Telephone Encounter (Signed)
Nurse Assessment Nurse: Redmond Pulling, RN, Levada Dy Date/Time Eilene Ghazi Time): 05/25/2022 8:23:29 AM Confirm and document reason for call. If symptomatic, describe symptoms. ---Caller states urinating blood and passed a clot; having mild to moderate abdominal pain. Symptoms started this morning when she got up. Got up several times to urinate. Had a UTI 2 months ago, had no pain or blood. No fever. Does the patient have any new or worsening symptoms? ---Yes Will a triage be completed? ---Yes Related visit to physician within the last 2 weeks? ---No Does the PT have any chronic conditions? (i.e. diabetes, asthma, this includes High risk factors for pregnancy, etc.) ---Yes List chronic conditions. ---COPD Is this a behavioral health or substance abuse call? ---No Guidelines Guideline Title Affirmed Question Affirmed Notes Nurse Date/Time (Eastern Time) Urine - Blood In  [1] Pain or burning with passing urine [2] side (flank) or back pain present Redmond Pulling, RN, Levada Dy 05/25/2022 8:26:03 AM Disp. Time Eilene Ghazi Time) Disposition Final User 05/25/2022 8:28:40 AM See HCP within 4 Hours (or PCP triage) Yes Redmond Pulling, RN, Levada Dy  Final Disposition 05/25/2022 8:28:40 AM See HCP within 4 Hours (or PCP triage) Marrion Coy, RN, Marin Shutter Disagree/Comply Comply Caller Understands Yes PreDisposition Call Doctor Care Advice Given Per Guideline SEE HCP (OR PCP TRIAGE) WITHIN 4 HOURS: * IF OFFICE WILL BE OPEN: You need to be seen within the next 3 or 4 hours. Call your doctor (or NP/PA) now or as soon as the office opens. PAIN MEDICINES: * For pain relief, you can take either acetaminophen, ibuprofen, or naproxen. CALL BACK IF: * Fever occurs * You become worse CARE ADVICE given per Urine, Blood In (Adult) guideline. Comments User: Jerrye Beavers Date/Time Eilene Ghazi Time): 05/25/2022 8:19:08 AM Info provided by Andee Poles; User: Ivonne Andrew, RN Date/Time Eilene Ghazi Time): 05/25/2022 8:32:15 AM Called office backline  and no appointment available, recommended to patient go to an UC. Referrals GO TO FACILITY UNDECIDED

## 2022-05-25 NOTE — Telephone Encounter (Signed)
Patient called in requesting an appointment as she was peeing blood and noticed a blood clot. Did have patient speak with Access Nurse.

## 2022-05-26 NOTE — Telephone Encounter (Signed)
Spoke to pt--went [facility] and prescribe Rx macrobid x7 days and will wait to make appt  f/u with Dr. Sharlet Salina if not feeling better after 7 days. Pt questioning if needed echo done form last LOV. Please advise

## 2022-05-26 NOTE — Telephone Encounter (Signed)
Notified pt echo been ordered and should get a call for appt.

## 2022-05-26 NOTE — Telephone Encounter (Signed)
The echo was ordered yes

## 2022-05-26 NOTE — Telephone Encounter (Signed)
Please advise 

## 2022-05-26 NOTE — Telephone Encounter (Signed)
It seems she was given advice to go to urgent care yesterday. Did she go?

## 2022-05-28 ENCOUNTER — Other Ambulatory Visit (HOSPITAL_COMMUNITY): Payer: Self-pay

## 2022-05-28 MED ORDER — CEPHALEXIN 500 MG PO CAPS
500.0000 mg | ORAL_CAPSULE | Freq: Two times a day (BID) | ORAL | 0 refills | Status: AC
  Filled 2022-05-28: qty 14, 7d supply, fill #0

## 2022-05-29 ENCOUNTER — Other Ambulatory Visit: Payer: PPO

## 2022-06-05 ENCOUNTER — Other Ambulatory Visit: Payer: Self-pay | Admitting: Acute Care

## 2022-06-08 ENCOUNTER — Other Ambulatory Visit (HOSPITAL_COMMUNITY): Payer: Self-pay

## 2022-06-08 MED ORDER — SEREVENT DISKUS 50 MCG/ACT IN AEPB
1.0000 | INHALATION_SPRAY | Freq: Two times a day (BID) | RESPIRATORY_TRACT | 0 refills | Status: DC
  Filled 2022-06-08: qty 180, 90d supply, fill #0

## 2022-06-09 ENCOUNTER — Other Ambulatory Visit (HOSPITAL_COMMUNITY): Payer: Self-pay

## 2022-06-11 ENCOUNTER — Other Ambulatory Visit (HOSPITAL_COMMUNITY): Payer: Self-pay

## 2022-06-16 ENCOUNTER — Telehealth (HOSPITAL_COMMUNITY): Payer: Self-pay | Admitting: Internal Medicine

## 2022-06-16 NOTE — Telephone Encounter (Signed)
We have attempted to contact the ordering providers office to obtain a prior authorization for the ordered test, Echocardiogram However, we have been unsuccesful. Order will be removed from the active order WQ. Once the prior authorization is obtained we will reinstate the order and schedule patient.    06/16/22 Order cancelled due to no response from ordering MD for Prior auth in order to schedule. [facility] Note sent to MD/LBW  06/08/22 Inbasket sent for PA# x 4  06/02/22 Inbasket sent for PA# x 3  05/26/22 Inbasket sent for PA# x 2  05/22/22 inbasket PA    Thank you

## 2022-07-02 ENCOUNTER — Ambulatory Visit (HOSPITAL_COMMUNITY): Payer: PPO | Attending: Internal Medicine

## 2022-07-02 DIAGNOSIS — I2721 Secondary pulmonary arterial hypertension: Secondary | ICD-10-CM | POA: Diagnosis not present

## 2022-07-03 LAB — ECHOCARDIOGRAM COMPLETE
Area-P 1/2: 3.6 cm2
S' Lateral: 2.9 cm

## 2022-07-13 ENCOUNTER — Other Ambulatory Visit (HOSPITAL_COMMUNITY): Payer: Self-pay

## 2022-07-15 ENCOUNTER — Other Ambulatory Visit (HOSPITAL_COMMUNITY): Payer: Self-pay

## 2022-07-15 MED ORDER — ROSUVASTATIN CALCIUM 5 MG PO TABS
5.0000 mg | ORAL_TABLET | Freq: Every day | ORAL | 1 refills | Status: DC
  Filled 2022-07-15: qty 90, 90d supply, fill #0

## 2022-07-16 ENCOUNTER — Other Ambulatory Visit (HOSPITAL_COMMUNITY): Payer: Self-pay

## 2022-07-22 ENCOUNTER — Other Ambulatory Visit (HOSPITAL_COMMUNITY): Payer: Self-pay

## 2022-08-04 ENCOUNTER — Other Ambulatory Visit (HOSPITAL_COMMUNITY): Payer: Self-pay

## 2022-08-04 DIAGNOSIS — H00024 Hordeolum internum left upper eyelid: Secondary | ICD-10-CM | POA: Diagnosis not present

## 2022-08-04 MED ORDER — DOXYCYCLINE MONOHYDRATE 100 MG PO TABS
100.0000 mg | ORAL_TABLET | Freq: Two times a day (BID) | ORAL | 0 refills | Status: DC
  Filled 2022-08-04: qty 14, 7d supply, fill #0

## 2022-08-04 MED ORDER — MAXITROL 3.5-10000-0.1 OP OINT
TOPICAL_OINTMENT | OPHTHALMIC | 1 refills | Status: DC
  Filled 2022-08-04: qty 3.5, 7d supply, fill #0
  Filled 2022-08-16: qty 3.5, 7d supply, fill #1

## 2022-08-18 ENCOUNTER — Ambulatory Visit: Payer: PPO | Admitting: Acute Care

## 2022-08-21 ENCOUNTER — Telehealth (HOSPITAL_COMMUNITY): Payer: Self-pay | Admitting: Surgery

## 2022-08-21 NOTE — Telephone Encounter (Signed)
I called patient in reference to referral and scheduling an [facility] Appt.  Appt scheduled for Jan 29th at 2:00pm.

## 2022-09-14 ENCOUNTER — Encounter (HOSPITAL_COMMUNITY): Payer: PPO | Admitting: Cardiology

## 2022-09-15 ENCOUNTER — Encounter: Payer: Self-pay | Admitting: Internal Medicine

## 2022-09-15 ENCOUNTER — Other Ambulatory Visit (HOSPITAL_COMMUNITY): Payer: Self-pay

## 2022-09-15 ENCOUNTER — Ambulatory Visit (INDEPENDENT_AMBULATORY_CARE_PROVIDER_SITE_OTHER): Payer: PPO | Admitting: Internal Medicine

## 2022-09-15 VITALS — BP 120/78 | HR 70 | Temp 97.6°F | Ht 67.0 in | Wt 210.0 lb

## 2022-09-15 DIAGNOSIS — R7303 Prediabetes: Secondary | ICD-10-CM

## 2022-09-15 DIAGNOSIS — I2721 Secondary pulmonary arterial hypertension: Secondary | ICD-10-CM

## 2022-09-15 DIAGNOSIS — J449 Chronic obstructive pulmonary disease, unspecified: Secondary | ICD-10-CM | POA: Diagnosis not present

## 2022-09-15 DIAGNOSIS — E039 Hypothyroidism, unspecified: Secondary | ICD-10-CM

## 2022-09-15 LAB — TSH: TSH: 0.65 u[IU]/mL (ref 0.35–5.50)

## 2022-09-15 LAB — HEMOGLOBIN A1C: Hgb A1c MFr Bld: 5.8 % (ref 4.6–6.5)

## 2022-09-15 MED ORDER — LEVOTHYROXINE SODIUM 125 MCG/ML PO SOLN
125.0000 ug | Freq: Every day | ORAL | 3 refills | Status: DC
  Filled 2022-09-15: qty 30, 30d supply, fill #0

## 2022-09-15 NOTE — Progress Notes (Signed)
   Subjective:   Patient ID: Annette [name], female    DOB: 05-21-1951, 72 y.o.   MRN: [id number]  HPI The patient is a 72 YO female coming in for follow up.  Review of Systems  Constitutional: Negative.   HENT: Negative.    Eyes: Negative.   Respiratory:  Negative for cough, chest tightness and shortness of breath.   Cardiovascular:  Negative for chest pain, palpitations and leg swelling.  Gastrointestinal:  Negative for abdominal distention, abdominal pain, constipation, diarrhea, nausea and vomiting.  Musculoskeletal: Negative.   Skin: Negative.   Neurological: Negative.   Psychiatric/Behavioral: Negative.      Objective:  Physical Exam Constitutional:      Appearance: She is well-developed.  HENT:     Head: Normocephalic and atraumatic.  Cardiovascular:     Rate and Rhythm: Normal rate and regular rhythm.  Pulmonary:     Effort: Pulmonary effort is normal. No respiratory distress.     Breath sounds: Normal breath sounds. No wheezing or rales.  Abdominal:     General: Bowel sounds are normal. There is no distension.     Palpations: Abdomen is soft.     Tenderness: There is no abdominal tenderness. There is no rebound.  Musculoskeletal:     Cervical back: Normal range of motion.  Skin:    General: Skin is warm and dry.  Neurological:     Mental Status: She is alert and oriented to person, place, and time.     Coordination: Coordination normal.     Vitals:   09/15/22 1018  BP: 120/78  Pulse: 70  Temp: 97.6 F (36.4 C)  TempSrc: Oral  SpO2: 94%  Weight: 210 lb (95.3 kg)  Height: '5\' 7"'$  (1.702 m)    Assessment & Plan:

## 2022-09-15 NOTE — Assessment & Plan Note (Signed)
Checking TSH and she wishes to change to liquid levothyroxine 125 mcg/mL daily new rx done.

## 2022-09-15 NOTE — Assessment & Plan Note (Signed)
Seeing pulmonary later this week and has visit with cardiology next week. Reviewed ECHO findings and likely will need RHC to directly measure pressures. On review of her chart we both noticed that this pulmonary hypertension was detected in 2019 through cardiology scan but was not brought to her attention or assessed by her cardiologist.

## 2022-09-15 NOTE — Assessment & Plan Note (Signed)
Has lost some weight due to diet since last visit. Checking HgA1c.

## 2022-09-15 NOTE — Patient Instructions (Signed)
Keep up the good work on the weight. We will check the labs today.

## 2022-09-15 NOTE — Assessment & Plan Note (Signed)
Breathing better since losing some weight and encouraged to continue.

## 2022-09-16 ENCOUNTER — Other Ambulatory Visit (HOSPITAL_COMMUNITY): Payer: Self-pay

## 2022-09-18 ENCOUNTER — Ambulatory Visit (INDEPENDENT_AMBULATORY_CARE_PROVIDER_SITE_OTHER): Payer: PPO | Admitting: Acute Care

## 2022-09-18 ENCOUNTER — Encounter: Payer: Self-pay | Admitting: Acute Care

## 2022-09-18 ENCOUNTER — Other Ambulatory Visit (HOSPITAL_COMMUNITY): Payer: Self-pay

## 2022-09-18 VITALS — BP 120/78 | HR 63 | Temp 98.0°F | Ht 67.0 in | Wt 207.0 lb

## 2022-09-18 DIAGNOSIS — J441 Chronic obstructive pulmonary disease with (acute) exacerbation: Secondary | ICD-10-CM

## 2022-09-18 DIAGNOSIS — J449 Chronic obstructive pulmonary disease, unspecified: Secondary | ICD-10-CM

## 2022-09-18 MED ORDER — SEREVENT DISKUS 50 MCG/ACT IN AEPB
1.0000 | INHALATION_SPRAY | Freq: Two times a day (BID) | RESPIRATORY_TRACT | 0 refills | Status: DC
  Filled 2022-09-18: qty 180, 90d supply, fill #0

## 2022-09-18 MED ORDER — ALBUTEROL SULFATE HFA 108 (90 BASE) MCG/ACT IN AERS
2.0000 | INHALATION_SPRAY | Freq: Four times a day (QID) | RESPIRATORY_TRACT | 5 refills | Status: DC | PRN
  Filled 2022-09-18: qty 6.7, 25d supply, fill #0
  Filled 2023-01-20: qty 6.7, 25d supply, fill #1
  Filled 2023-05-31: qty 6.7, 25d supply, fill #2

## 2022-09-18 NOTE — Progress Notes (Unsigned)
History of Present Illness Annette [name] is a 72 y.o. female  former smoker ( 38 pack year smoking history, quit 2010) with COPD a history of CAD.She is followed by Dr. Lamonte Sakai.  Maintenance Sertevent  Rescue albuterol   09/18/2022 Pt. Presents for follow up. She states she has been doing really well. She went to healthy weight and wellness, and she lost weight. She had some family issues, and she regained that weight. She is back to 20 pound deficit now that things have calmed down. She states that she feels much better with her weight loss. Her breathing is better. She is on a diet, and she is doing well. High protein and low carbs. She does fast from dinner to lunch time the next day. This has been working well for her. Since weight loss she has hasd less cough and much less shortness of breath.   She did have an echo. Her screening CT /chest showed an enlarged pulmonary artery. Echo did not show regurgitation . She has follow up with Dr.McLean next week.   She rarely has to use her rescue in the winter. She usually needs this in the summer. She wants to walk outside when the weather gets better, we will refill this for her.   Test Results:     Latest Ref Rng & Units 05/22/2022   10:25 AM 03/23/2015    4:40 AM 03/15/2015   11:05 AM  CBC  WBC 4.0 - 10.5 K/uL 9.1  11.6  7.4   Hemoglobin 12.0 - 15.0 g/dL 13.6  11.2  14.2   Hematocrit 36.0 - 46.0 % 40.1  33.1  42.4   Platelets 150.0 - 400.0 K/uL 327.0  228  332        Latest Ref Rng & Units 05/22/2022   10:25 AM 04/23/2021    8:15 AM 05/31/2019    2:02 PM  BMP  Glucose 70 - 99 mg/dL 102  91  98   BUN 6 - 23 mg/dL '16  15  19   '$ Creatinine 0.40 - 1.20 mg/dL 0.75  0.75  0.81   BUN/Creat Ratio 12 - '28  20  23   '$ Sodium 135 - 145 mEq/L 137  139  140   Potassium 3.5 - 5.1 mEq/L 4.3  5.0  4.6   Chloride 96 - 112 mEq/L 97  97  95   CO2 19 - 32 mEq/L 35  29  30   Calcium 8.4 - 10.5 mg/dL 9.8  9.5  9.8     BNP    Component Value Date/Time    BNP 17.4 05/31/2019 1402    ProBNP No results found for: "PROBNP"  PFT    Component Value Date/Time   FEV1PRE 1.51 10/10/2019 1622   FEV1POST 1.46 10/10/2019 1622   FVCPRE 2.27 10/10/2019 1622   FVCPOST 2.20 10/10/2019 1622   TLC 5.05 10/10/2019 1622   DLCOUNC 18.30 10/10/2019 1622   PREFEV1FVCRT 66 10/10/2019 1622   PSTFEV1FVCRT 67 10/10/2019 1622    No results found.   Past medical hx Past Medical History:  Diagnosis Date   Arthritis    Right hip, end stage   Back pain    COPD (chronic obstructive pulmonary disease) (HCC)    Coronary artery calcification seen on computed tomography 02/2018   Coronary calcium score 79.  Coronary CT angiogram: Mild CAD and proximal LAD, proximal RCA and mid LCx.  Moderate plaque ostial LCx.  Mildly dilated pulmonary artery, suggestive of possible  pulmonary hypertension   Difficulty sleeping    DUE TO PAIN   Edema of both lower extremities    Emphysema of lung (HCC)    Fluid retention    TAKES HCTZ   GERD (gastroesophageal reflux disease)    Hemorrhoids    Hyperlipidemia    Hypertension    Hypothyroidism    IBS (irritable bowel syndrome)    Irritable bowel syndrome    Joint pain    Macular degeneration    Osteopenia    Shingles 2004   SOB (shortness of breath)    Tremors of nervous system    TAKES PROPRANOLOL TO TX   Varicose veins    Vitamin D deficiency      Social History   Tobacco Use   Smoking status: Former    Packs/day: 1.00    Years: 38.00    Total pack years: 38.00    Types: Cigarettes    Quit date: 03/14/2009    Years since quitting: 13.5   Smokeless tobacco: Never  Substance Use Topics   Alcohol use: Yes    Comment: beer every 6-8 months   Drug use: No    Annette [name] reports that she quit smoking about 13 years ago. Her smoking use included cigarettes. She has a 38.00 pack-year smoking history. She has never used smokeless tobacco. She reports current alcohol use. She reports that she does not use  drugs.  Tobacco Cessation: Counseling given: Not Answered   Past surgical hx, Family hx, Social hx all reviewed.  Current Outpatient Medications on File Prior to Visit  Medication Sig   ALPRAZolam (XANAX) 0.25 MG tablet Take 1 tablet (0.25 mg total) by mouth at bedtime as needed.   aspirin EC 81 MG tablet Take 81 mg by mouth daily.   Coenzyme Q10 (CO Q-10) 300 MG CAPS 1 capsule   guaiFENesin (MUCINEX) 600 MG 12 hr tablet Take 2 tablets (1,200 mg total) by mouth 2 (two) times daily as needed.   hydrochlorothiazide (HYDRODIURIL) 25 MG tablet Take 1 tablet (25 mg total) by mouth every morning.   KRILL OIL OMEGA-3 PO Take by mouth. Pt taking bid.   Levothyroxine Sodium 125 MCG/ML SOLN Take 125 mcg (1 ml) by mouth daily.   Magnesium 400 MG TABS Take 400 mg by mouth.   Naproxen Sodium (ALEVE PO) Take 400 mg by mouth as directed.   neomycin-polymyxin-dexameth (MAXITROL) 0.1 % OINT Apply 1 Application on eyelid four times a day   propranolol ER (INDERAL LA) 60 MG 24 hr capsule Take 1 capsule (60 mg total) by mouth daily.   rosuvastatin (CRESTOR) 5 MG tablet Take 1 tablet (5 mg total) by mouth daily.   salmeterol (SEREVENT DISKUS) 50 MCG/ACT diskus inhaler Inhale 1 puff into the lungs in the morning and at bedtime. Will need appointment for further refills.   valACYclovir (VALTREX) 500 MG tablet Take 1 tablet (500 mg total) by mouth daily.   albuterol (VENTOLIN HFA) 108 (90 Base) MCG/ACT inhaler Inhale 2 puffs into the lungs every 6 (six) hours as needed for wheezing or shortness of breath. (Patient not taking: Reported on 09/18/2022)   No current facility-administered medications on file prior to visit.     Allergies  Allergen Reactions   Augmentin [Amoxicillin-Pot Clavulanate] Nausea And Vomiting   Codeine Other (See Comments)    Causes her to stay awake    Levofloxacin Other (See Comments)    Severe joint and muscle pain   Other Hives and Other (See Comments)  IV contrast     Iodinated Contrast Media Rash    Review Of Systems:  Constitutional:   No  weight loss, night sweats,  Fevers, chills, fatigue, or  lassitude.  HEENT:   No headaches,  Difficulty swallowing,  Tooth/dental problems, or  Sore throat,                No sneezing, itching, ear ache, nasal congestion, post nasal drip,   CV:  No chest pain,  Orthopnea, PND, swelling in lower extremities, anasarca, dizziness, palpitations, syncope.   GI  No heartburn, indigestion, abdominal pain, nausea, vomiting, diarrhea, change in bowel habits, loss of appetite, bloody stools.   Resp: No shortness of breath with exertion or at rest.  No excess mucus, no productive cough,  No non-productive cough,  No coughing up of blood.  No change in color of mucus.  No wheezing.  No chest wall deformity  Skin: no rash or lesions.  GU: no dysuria, change in color of urine, no urgency or frequency.  No flank pain, no hematuria   MS:  No joint pain or swelling.  No decreased range of motion.  No back pain.  Psych:  No change in mood or affect. No depression or anxiety.  No memory loss.   Vital Signs BP 120/78   Pulse 63   Temp 98 F (36.7 C) (Oral)   Ht '5\' 7"'$  (1.702 m)   Wt 207 lb (93.9 kg)   SpO2 95%   BMI 32.42 kg/m    Physical Exam:  General- No distress,  A&Ox3 ENT: No sinus tenderness, TM clear, pale nasal mucosa, no oral exudate,no post nasal drip, no LAN Cardiac: S1, S2, regular rate and rhythm, no murmur Chest: No wheeze/ rales/ dullness; no accessory muscle use, no nasal flaring, no sternal retractions Abd.: Soft Non-tender Ext: No clubbing cyanosis, edema Neuro:  normal strength Skin: No rashes, warm and dry Psych: normal mood and behavior   Assessment/Plan  No problem-specific Assessment & Plan notes found for this encounter.    Magdalen Spatz, NP 09/18/2022  10:57 AM

## 2022-09-18 NOTE — Patient Instructions (Addendum)
It is good to see you today.  We will refill your albuterol and your serevent  Continue Serevent 1 puff in the morning and one puff in the evening Remember to rinse mouth after use.  Continue albuterol 1-2 puffs as needed fpr breakthrough shortness of breath or wheezing.  Keep working on weight loss. Follow up in 12 months  with Judson Roch NP or Dr. Lamonte Sakai Call if you need [place] sooner  Follow up with Dr. Aundra Dubin as is scheduled.  Please contact office for sooner follow up if symptoms do not improve or worsen or seek emergency care

## 2022-09-19 ENCOUNTER — Encounter: Payer: Self-pay | Admitting: Acute Care

## 2022-09-21 ENCOUNTER — Other Ambulatory Visit (HOSPITAL_COMMUNITY): Payer: Self-pay

## 2022-09-21 ENCOUNTER — Ambulatory Visit (HOSPITAL_COMMUNITY)
Admission: RE | Admit: 2022-09-21 | Discharge: 2022-09-21 | Disposition: A | Discharge status: Home / Self Care | Payer: PPO | Source: Ambulatory Visit | Attending: Cardiology | Admitting: Cardiology

## 2022-09-21 ENCOUNTER — Encounter (HOSPITAL_COMMUNITY): Payer: Self-pay | Admitting: Cardiology

## 2022-09-21 VITALS — BP 136/90 | HR 68 | Ht 67.0 in | Wt 211.2 lb

## 2022-09-21 DIAGNOSIS — Z87891 Personal history of nicotine dependence: Secondary | ICD-10-CM | POA: Diagnosis not present

## 2022-09-21 DIAGNOSIS — I2721 Secondary pulmonary arterial hypertension: Secondary | ICD-10-CM

## 2022-09-21 DIAGNOSIS — Z8249 Family history of ischemic heart disease and other diseases of the circulatory system: Secondary | ICD-10-CM | POA: Insufficient documentation

## 2022-09-21 DIAGNOSIS — I2510 Atherosclerotic heart disease of native coronary artery without angina pectoris: Secondary | ICD-10-CM | POA: Diagnosis not present

## 2022-09-21 DIAGNOSIS — R0609 Other forms of dyspnea: Secondary | ICD-10-CM | POA: Insufficient documentation

## 2022-09-21 DIAGNOSIS — I10 Essential (primary) hypertension: Secondary | ICD-10-CM | POA: Diagnosis not present

## 2022-09-21 DIAGNOSIS — J449 Chronic obstructive pulmonary disease, unspecified: Secondary | ICD-10-CM | POA: Insufficient documentation

## 2022-09-21 DIAGNOSIS — Z79899 Other long term (current) drug therapy: Secondary | ICD-10-CM | POA: Insufficient documentation

## 2022-09-21 LAB — BRAIN NATRIURETIC PEPTIDE: B Natriuretic Peptide: 40.5 pg/mL (ref 0.0–100.0)

## 2022-09-21 MED ORDER — ROSUVASTATIN CALCIUM 10 MG PO TABS
10.0000 mg | ORAL_TABLET | Freq: Every day | ORAL | 3 refills | Status: DC
  Filled 2022-09-21: qty 90, 90d supply, fill #0

## 2022-09-21 NOTE — Patient Instructions (Addendum)
INCREASE Crestor to 10 mg daily   Labs done today, your results will be available in MyChart, we will contact you for abnormal readings.  You are scheduled for a Cardiac Catheterization on Friday, February 16 with Dr. Loralie Champagne.  1. Please arrive at the [facility] (Main Entrance A) at [facility]: [address] [place], [place] [address] at 11:30 AM (This time is two hours before your procedure to ensure your preparation). Free valet parking service is available.   Special note: Every effort is made to have your procedure done on time. Please understand that emergencies sometimes delay scheduled procedures.  2. Diet: Do not eat solid foods after midnight.  The patient may have clear liquids until 5am upon the day of the procedure.  3. Medication instructions in preparation for your procedure:   Contrast Allergy: No  On the morning of your procedure, take your  morning medicines NOT listed above.  You may use sips of water.  5. Plan for one night stay--bring personal belongings. 6. Bring a current list of your medications and current insurance cards. 7. You MUST have a responsible person to drive you home. 8. Someone MUST be with you the first 24 hours after you arrive home or your discharge will be delayed. 9. Please wear clothes that are easy to get on and off and wear slip-on shoes.  Thank you for allowing [place] to care for you!   -- [facility]   Your physician recommends that you schedule a follow-up appointment will be arranged post Cath   If you have any questions or concerns before your next appointment please send [place] a message through [facility] or call our office at [phone].    TO LEAVE A MESSAGE FOR THE NURSE SELECT OPTION 2, PLEASE LEAVE A MESSAGE INCLUDING: YOUR NAME DATE OF BIRTH CALL BACK NUMBER REASON FOR CALL**this is important as we prioritize the call backs  YOU WILL RECEIVE A CALL BACK THE SAME DAY AS LONG AS YOU  CALL BEFORE 4:00 PM  At the [facility], you and your health needs are our priority. As part of our continuing mission to provide you with exceptional heart care, we have created designated Provider Care Teams. These Care Teams include your primary Cardiologist (physician) and Advanced Practice Providers (APPs- Physician Assistants and Nurse Practitioners) who all work together to provide you with the care you need, when you need it.   You may see any of the following providers on your designated Care Team at your next follow up: Dr Glori Bickers Dr Loralie Champagne Dr. Roxana Hires, NP Lyda Jester, [place] [facility] [place], [place] Forestine Na, NP Audry Riles, PharmD   Please be sure to bring in all your medications bottles to every appointment.    Thank you for choosing [facility]

## 2022-09-21 NOTE — H&P (View-Only) (Signed)
PCP: Hoyt Koch, MD Cardiology: Dr Aundra Dubin  72 y.o. with history of HTN and COPD was referred by Dr. Sharlet Salina for evaluation of pulmonary hypertension.  Patient was a prior smoker, quit in 2010.  She has CAD noted by coronary calcification on chest CT but Cardiolite in 11/20 was normal.  She had a chest CT for lung cancer screening in 9/23, this showed an enlarged pulmonary artery concerning for pulmonary hypertension. She had an echo done after this showing EF 60-65%, normal RV, but no TR jet so unable to estimate PA systolic pressure.    Patient gets mildly short of breath walking a long distance around her neighborhood.  No problems with her usual ADLs.  No shortness of breat walking up stairs, carrying out garbage, etc.  No chest pain.  No lightheadedness or palpitations.    ECG (personally reviewed): NSR, normal  Labs (10/23): LDL 78, K 4.3, creatinine 0.75  PMH: 1. HTN 2. Hyperlipidemia 3. COPD: Quit smoking in 2010.  4. CAD: Calcified coronaries noted on prior CT chest.  - Cardiolite (11/20): normal 5. Familial tremor 6. H/o THR 7. Echo (11/23): EF 60-65%, RV normal, unable to estimate PA systolic pressure.   Social History   Socioeconomic History   Marital status: Divorced    Spouse name: Not on file   Number of children: 1   Years of education: Not on file   Highest education level: Bachelor's degree (e.g., BA, AB, BS)  Occupational History   Occupation: retired Therapist, sports    Comment: RN endoscopyt  Tobacco Use   Smoking status: Former    Packs/day: 1.00    Years: 38.00    Total pack years: 38.00    Types: Cigarettes    Quit date: 03/14/2009    Years since quitting: 13.5   Smokeless tobacco: Never  Substance and Sexual Activity   Alcohol use: Yes    Comment: beer every 6-8 months   Drug use: No   Sexual activity: Not on file  Other Topics Concern   Not on file  Social History Narrative   [employer]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 1 son   1 caffeine drink daily    Updated 06/01/2013   Social Determinants of Health   Financial Resource Strain: Not on file  Food Insecurity: Not on file  Transportation Needs: Not on file  Physical Activity: Not on file  Stress: Not on file  Social Connections: Not on file  Intimate Partner Violence: Not on file   Family History  Problem Relation Age of Onset   Alzheimer's disease Father        developed CAD late in life   High blood pressure Father    High Cholesterol Father    Heart disease Father    AAA (abdominal aortic aneurysm) Father    Stroke Mother        died @ ~52 y/o   CAD Mother 66       s/p CABG   High blood pressure Mother    High Cholesterol Mother    Heart disease Mother    Thyroid disease Mother    Anxiety disorder Mother    Alcoholism Mother    Breast cancer Sister    ROS: All systems reviewed and negative except as per HPI.   Current Outpatient Medications  Medication Sig Dispense Refill   albuterol (VENTOLIN HFA) 108 (90 Base) MCG/ACT inhaler Inhale 2 puffs into the lungs every 6 (six) hours as needed for wheezing or  shortness of breath. 6.7 g 5   aspirin EC 81 MG tablet Take 81 mg by mouth daily.     hydrochlorothiazide (HYDRODIURIL) 25 MG tablet Take 1 tablet (25 mg total) by mouth every morning. 90 tablet 3   KRILL OIL OMEGA-3 PO Take by mouth. Pt taking bid.     Levothyroxine Sodium 125 MCG/ML SOLN Take 125 mcg (1 ml) by mouth daily. 90 mL 3   Magnesium 400 MG TABS Take 400 mg by mouth.     Naproxen Sodium (ALEVE PO) Take 400 mg by mouth as directed.     propranolol ER (INDERAL LA) 60 MG 24 hr capsule Take 1 capsule (60 mg total) by mouth daily. 90 capsule 3   salmeterol (SEREVENT DISKUS) 50 MCG/ACT diskus inhaler Inhale 1 puff into the lungs in the morning and at bedtime. Will need appointment for further refills. 180 each 0   valACYclovir (VALTREX) 500 MG tablet Take 1 tablet (500 mg total) by mouth daily. (Patient taking differently: Take 500 mg by mouth as needed.) 30  tablet 2   ALPRAZolam (XANAX) 0.25 MG tablet Take 1 tablet (0.25 mg total) by mouth at bedtime as needed. (Patient not taking: Reported on 09/21/2022) 20 tablet 0   Coenzyme Q10 (CO Q-10) 300 MG CAPS 1 capsule (Patient not taking: Reported on 09/21/2022)     guaiFENesin (MUCINEX) 600 MG 12 hr tablet Take 2 tablets (1,200 mg total) by mouth 2 (two) times daily as needed. (Patient not taking: Reported on 09/21/2022) 60 tablet 1   neomycin-polymyxin-dexameth (MAXITROL) 0.1 % OINT Apply 1 Application on eyelid four times a day (Patient not taking: Reported on 09/21/2022) 3.5 g 1   rosuvastatin (CRESTOR) 10 MG tablet Take 1 tablet (10 mg total) by mouth daily. 90 tablet 3   No current facility-administered medications for this encounter.   BP (!) 136/90   Pulse 68   Ht 5' 7"$  (1.702 m)   Wt 95.8 kg (211 lb 3.2 oz)   SpO2 96%   BMI 33.08 kg/m  General: NAD Neck: No JVD, no thyromegaly or thyroid nodule.  Lungs: Clear to auscultation bilaterally with normal respiratory effort. CV: Nondisplaced PMI.  Heart regular S1/S2, no S3/S4, no murmur.  No peripheral edema.  No carotid bruit.  Normal pedal pulses.  Abdomen: Soft, nontender, no hepatosplenomegaly, no distention.  Skin: Intact without lesions or rashes.  Neurologic: Alert and oriented x 3.  Psych: Normal affect. Extremities: No clubbing or cyanosis.  HEENT: Normal.   Assessment/Plan: 1. Pulmonary hypertension: Concern for pulmonary hypertension based on CT chest showing enlarged pulmonary artery.  Echo in 11/23 showed EF 60-65%, RV normal, unable to estimate PA systolic pressure. Symptomatically mild dyspnea, no more than NYHA class II.  She does carry a diagnosis of COPD and was a prior smoker. Patient certainly could have mild group 3 pulmonary hypertension due to COPD.   - Given enlarged PA and inability to assess PA pressure noninvasively by echo, I think it would be reasonable to do a RHC.  We discussed risks/benefits and she agrees to  procedure.  - Check BNP.  2. CAD: Patient has known coronary calcification by prior CT chests.  She had a Cardiolite in 2020 with no ischemia.  She has no chest pain and minimal exertional dyspnea.  - I do not think that she needs coronary angiography.  - I will increase Crestor to 10 mg daily with goal LDL < 70, check lipids/LFTs in 2 months.  3. HTN:  BP is mildly elevated, will watch for now.   Followup will depend on RHC.   Loralie Champagne 09/21/2022

## 2022-09-21 NOTE — Progress Notes (Signed)
PCP: [name], Annette A, MD Cardiology: Dr Linzy Laury  72 y.o. with history of HTN and COPD was referred by Dr. Crawford for evaluation of pulmonary hypertension.  Patient was a prior smoker, quit in 2010.  She has CAD noted by coronary calcification on chest CT but Cardiolite in 11/20 was normal.  She had a chest CT for lung cancer screening in 9/23, this showed an enlarged pulmonary artery concerning for pulmonary hypertension. She had an echo done after this showing EF 60-65%, normal RV, but no TR jet so unable to estimate PA systolic pressure.    Patient gets mildly short of breath walking a long distance around her neighborhood.  No problems with her usual ADLs.  No shortness of breat walking up stairs, carrying out garbage, etc.  No chest pain.  No lightheadedness or palpitations.    ECG (personally reviewed): NSR, normal  Labs (10/23): LDL 78, K 4.3, creatinine 0.75  PMH: 1. HTN 2. Hyperlipidemia 3. COPD: Quit smoking in 2010.  4. CAD: Calcified coronaries noted on prior CT chest.  - Cardiolite (11/20): normal 5. Familial tremor 6. H/o THR 7. Echo (11/23): EF 60-65%, RV normal, unable to estimate PA systolic pressure.   Social History   Socioeconomic History   Marital status: Divorced    Spouse name: Not on file   Number of children: 1   Years of education: Not on file   Highest education level: Bachelor's degree (e.g., BA, AB, BS)  Occupational History   Occupation: retired RN    Comment: RN endoscopyt  Tobacco Use   Smoking status: Former    Packs/day: 1.00    Years: 38.00    Total pack years: 38.00    Types: Cigarettes    Quit date: 03/14/2009    Years since quitting: 13.5   Smokeless tobacco: Never  Substance and Sexual Activity   Alcohol use: Yes    Comment: beer every 6-8 months   Drug use: No   Sexual activity: Not on file  Other Topics Concern   Not on file  Social History Narrative   [facility]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 1 son   1 caffeine drink daily    Updated 06/01/2013   Social Determinants of Health   Financial Resource Strain: Not on file  Food Insecurity: Not on file  Transportation Needs: Not on file  Physical Activity: Not on file  Stress: Not on file  Social Connections: Not on file  Intimate Partner Violence: Not on file   Family History  Problem Relation Age of Onset   Alzheimer's disease Father        developed CAD late in life   High blood pressure Father    High Cholesterol Father    Heart disease Father    AAA (abdominal aortic aneurysm) Father    Stroke Mother        died @ ~90 y/o   CAD Mother 60       s/p CABG   High blood pressure Mother    High Cholesterol Mother    Heart disease Mother    Thyroid disease Mother    Anxiety disorder Mother    Alcoholism Mother    Breast cancer Sister    ROS: All systems reviewed and negative except as per HPI.   Current Outpatient Medications  Medication Sig Dispense Refill   albuterol (VENTOLIN HFA) 108 (90 Base) MCG/ACT inhaler Inhale 2 puffs into the lungs every 6 (six) hours as needed for wheezing or   shortness of breath. 6.7 g 5   aspirin EC 81 MG tablet Take 81 mg by mouth daily.     hydrochlorothiazide (HYDRODIURIL) 25 MG tablet Take 1 tablet (25 mg total) by mouth every morning. 90 tablet 3   KRILL OIL OMEGA-3 PO Take by mouth. Pt taking bid.     Levothyroxine Sodium 125 MCG/ML SOLN Take 125 mcg (1 ml) by mouth daily. 90 mL 3   Magnesium 400 MG TABS Take 400 mg by mouth.     Naproxen Sodium (ALEVE PO) Take 400 mg by mouth as directed.     propranolol ER (INDERAL LA) 60 MG 24 hr capsule Take 1 capsule (60 mg total) by mouth daily. 90 capsule 3   salmeterol (SEREVENT DISKUS) 50 MCG/ACT diskus inhaler Inhale 1 puff into the lungs in the morning and at bedtime. Will need appointment for further refills. 180 each 0   valACYclovir (VALTREX) 500 MG tablet Take 1 tablet (500 mg total) by mouth daily. (Patient taking differently: Take 500 mg by mouth as needed.) 30  tablet 2   ALPRAZolam (XANAX) 0.25 MG tablet Take 1 tablet (0.25 mg total) by mouth at bedtime as needed. (Patient not taking: Reported on 09/21/2022) 20 tablet 0   Coenzyme Q10 (CO Q-10) 300 MG CAPS 1 capsule (Patient not taking: Reported on 09/21/2022)     guaiFENesin (MUCINEX) 600 MG 12 hr tablet Take 2 tablets (1,200 mg total) by mouth 2 (two) times daily as needed. (Patient not taking: Reported on 09/21/2022) 60 tablet 1   neomycin-polymyxin-dexameth (MAXITROL) 0.1 % OINT Apply 1 Application on eyelid four times a day (Patient not taking: Reported on 09/21/2022) 3.5 g 1   rosuvastatin (CRESTOR) 10 MG tablet Take 1 tablet (10 mg total) by mouth daily. 90 tablet 3   No current facility-administered medications for this encounter.   BP (!) 136/90   Pulse 68   Ht 5' 7" (1.702 m)   Wt 95.8 kg (211 lb 3.2 oz)   SpO2 96%   BMI 33.08 kg/m  General: NAD Neck: No JVD, no thyromegaly or thyroid nodule.  Lungs: Clear to auscultation bilaterally with normal respiratory effort. CV: Nondisplaced PMI.  Heart regular S1/S2, no S3/S4, no murmur.  No peripheral edema.  No carotid bruit.  Normal pedal pulses.  Abdomen: Soft, nontender, no hepatosplenomegaly, no distention.  Skin: Intact without lesions or rashes.  Neurologic: Alert and oriented x 3.  Psych: Normal affect. Extremities: No clubbing or cyanosis.  HEENT: Normal.   Assessment/Plan: 1. Pulmonary hypertension: Concern for pulmonary hypertension based on CT chest showing enlarged pulmonary artery.  Echo in 11/23 showed EF 60-65%, RV normal, unable to estimate PA systolic pressure. Symptomatically mild dyspnea, no more than NYHA class II.  She does carry a diagnosis of COPD and was a prior smoker. Patient certainly could have mild group 3 pulmonary hypertension due to COPD.   - Given enlarged PA and inability to assess PA pressure noninvasively by echo, I think it would be reasonable to do a RHC.  We discussed risks/benefits and she agrees to  procedure.  - Check BNP.  2. CAD: Patient has known coronary calcification by prior CT chests.  She had a Cardiolite in 2020 with no ischemia.  She has no chest pain and minimal exertional dyspnea.  - I do not think that she needs coronary angiography.  - I will increase Crestor to 10 mg daily with goal LDL < 70, check lipids/LFTs in 2 months.  3. HTN:   BP is mildly elevated, will watch for now.   Followup will depend on RHC.   Annette [name] 09/21/2022  

## 2022-09-22 ENCOUNTER — Telehealth (HOSPITAL_COMMUNITY): Payer: Self-pay

## 2022-09-22 ENCOUNTER — Other Ambulatory Visit (HOSPITAL_COMMUNITY): Payer: Self-pay

## 2022-09-22 NOTE — Telephone Encounter (Signed)
Patient called and stated she DOES have a contrast allergy that is noted in her chart, however her instructions for her upcoming cath stated she did not have an allergy. She wanted to know if this will change her instructions or anything they do for her cath.  Please advise.

## 2022-10-02 ENCOUNTER — Telehealth (HOSPITAL_COMMUNITY): Payer: Self-pay | Admitting: *Deleted

## 2022-10-02 ENCOUNTER — Other Ambulatory Visit: Payer: Self-pay

## 2022-10-02 ENCOUNTER — Other Ambulatory Visit (HOSPITAL_COMMUNITY): Payer: Self-pay

## 2022-10-02 ENCOUNTER — Ambulatory Visit (HOSPITAL_COMMUNITY)
Admission: RE | Admit: 2022-10-02 | Discharge: 2022-10-02 | Disposition: A | Discharge status: Home / Self Care | Payer: PPO | Attending: Cardiology | Admitting: Cardiology

## 2022-10-02 ENCOUNTER — Encounter (HOSPITAL_COMMUNITY)
Admission: RE | Disposition: A | Discharge status: Home / Self Care | Payer: Self-pay | Source: Home / Self Care | Attending: Cardiology

## 2022-10-02 DIAGNOSIS — Z79899 Other long term (current) drug therapy: Secondary | ICD-10-CM | POA: Diagnosis not present

## 2022-10-02 DIAGNOSIS — I272 Pulmonary hypertension, unspecified: Secondary | ICD-10-CM | POA: Diagnosis not present

## 2022-10-02 DIAGNOSIS — J449 Chronic obstructive pulmonary disease, unspecified: Secondary | ICD-10-CM | POA: Diagnosis not present

## 2022-10-02 DIAGNOSIS — I10 Essential (primary) hypertension: Secondary | ICD-10-CM | POA: Insufficient documentation

## 2022-10-02 DIAGNOSIS — I2510 Atherosclerotic heart disease of native coronary artery without angina pectoris: Secondary | ICD-10-CM | POA: Insufficient documentation

## 2022-10-02 DIAGNOSIS — Z87891 Personal history of nicotine dependence: Secondary | ICD-10-CM | POA: Diagnosis not present

## 2022-10-02 DIAGNOSIS — I2721 Secondary pulmonary arterial hypertension: Secondary | ICD-10-CM

## 2022-10-02 HISTORY — PX: RIGHT HEART CATH: CATH118263

## 2022-10-02 LAB — POCT I-STAT EG7
Acid-Base Excess: 5 mmol/L — ABNORMAL HIGH (ref 0.0–2.0)
Acid-Base Excess: 6 mmol/L — ABNORMAL HIGH (ref 0.0–2.0)
Bicarbonate: 31.3 mmol/L — ABNORMAL HIGH (ref 20.0–28.0)
Bicarbonate: 32.4 mmol/L — ABNORMAL HIGH (ref 20.0–28.0)
Calcium, Ion: 1.22 mmol/L (ref 1.15–1.40)
Calcium, Ion: 1.23 mmol/L (ref 1.15–1.40)
HCT: 38 % (ref 36.0–46.0)
HCT: 38 % (ref 36.0–46.0)
Hemoglobin: 12.9 g/dL (ref 12.0–15.0)
Hemoglobin: 12.9 g/dL (ref 12.0–15.0)
O2 Saturation: 73 %
O2 Saturation: 73 %
Potassium: 3.6 mmol/L (ref 3.5–5.1)
Potassium: 3.6 mmol/L (ref 3.5–5.1)
Sodium: 136 mmol/L (ref 135–145)
Sodium: 136 mmol/L (ref 135–145)
TCO2: 33 mmol/L — ABNORMAL HIGH (ref 22–32)
TCO2: 34 mmol/L — ABNORMAL HIGH (ref 22–32)
pCO2, Ven: 49.9 mmHg (ref 44–60)
pCO2, Ven: 51.3 mmHg (ref 44–60)
pH, Ven: 7.406 (ref 7.25–7.43)
pH, Ven: 7.408 (ref 7.25–7.43)
pO2, Ven: 39 mmHg (ref 32–45)
pO2, Ven: 39 mmHg (ref 32–45)

## 2022-10-02 LAB — CBC
HCT: 38.1 % (ref 36.0–46.0)
Hemoglobin: 12.7 g/dL (ref 12.0–15.0)
MCH: 29.1 pg (ref 26.0–34.0)
MCHC: 33.3 g/dL (ref 30.0–36.0)
MCV: 87.2 fL (ref 80.0–100.0)
Platelets: 329 10*3/uL (ref 150–400)
RBC: 4.37 MIL/uL (ref 3.87–5.11)
RDW: 13.2 % (ref 11.5–15.5)
WBC: 7.9 10*3/uL (ref 4.0–10.5)
nRBC: 0 % (ref 0.0–0.2)

## 2022-10-02 LAB — BASIC METABOLIC PANEL
Anion gap: 8 (ref 5–15)
BUN: 12 mg/dL (ref 8–23)
CO2: 33 mmol/L — ABNORMAL HIGH (ref 22–32)
Calcium: 9.1 mg/dL (ref 8.9–10.3)
Chloride: 93 mmol/L — ABNORMAL LOW (ref 98–111)
Creatinine, Ser: 0.77 mg/dL (ref 0.44–1.00)
GFR, Estimated: >60 mL/min (ref >60)
Glucose, Bld: 113 mg/dL — ABNORMAL HIGH (ref 70–99)
Potassium: 3.6 mmol/L (ref 3.5–5.1)
Sodium: 134 mmol/L — ABNORMAL LOW (ref 135–145)

## 2022-10-02 SURGERY — RIGHT HEART CATH
Anesthesia: LOCAL

## 2022-10-02 MED ORDER — LIDOCAINE HCL (PF) 1 % IJ SOLN
INTRAMUSCULAR | Status: AC
  Filled 2022-10-02: qty 30

## 2022-10-02 MED ORDER — ONDANSETRON HCL 4 MG/2ML IJ SOLN: 4.0000 mg | Freq: Four times a day (QID) | INTRAMUSCULAR | Status: DC | PRN

## 2022-10-02 MED ORDER — SODIUM CHLORIDE 0.9 % IV SOLN: 250.0000 mL | INTRAVENOUS | Status: DC | PRN

## 2022-10-02 MED ORDER — SODIUM CHLORIDE 0.9% FLUSH: 3.0000 mL | Freq: Two times a day (BID) | INTRAVENOUS | Status: DC

## 2022-10-02 MED ORDER — SODIUM CHLORIDE 0.9 % IV SOLN: INTRAVENOUS | Status: DC

## 2022-10-02 MED ORDER — FUROSEMIDE 20 MG PO TABS
20.0000 mg | ORAL_TABLET | Freq: Every day | ORAL | 11 refills | Status: DC
  Filled 2022-10-02: qty 30, 30d supply, fill #0

## 2022-10-02 MED ORDER — LABETALOL HCL 5 MG/ML IV SOLN: 10.0000 mg | INTRAVENOUS | Status: DC | PRN

## 2022-10-02 MED ORDER — SODIUM CHLORIDE 0.9% FLUSH: 3.0000 mL | INTRAVENOUS | Status: DC | PRN

## 2022-10-02 MED ORDER — HYDRALAZINE HCL 20 MG/ML IJ SOLN: 10.0000 mg | INTRAMUSCULAR | Status: DC | PRN

## 2022-10-02 MED ORDER — LIDOCAINE HCL (PF) 1 % IJ SOLN
INTRAMUSCULAR | Status: DC | PRN
  Administered 2022-10-02: 2 mL

## 2022-10-02 MED ORDER — POTASSIUM CHLORIDE CRYS ER 10 MEQ PO TBCR
10.0000 meq | EXTENDED_RELEASE_TABLET | Freq: Every day | ORAL | 3 refills | Status: DC
  Filled 2022-10-02: qty 90, 90d supply, fill #0

## 2022-10-02 MED ORDER — ACETAMINOPHEN 325 MG PO TABS: 650.0000 mg | ORAL_TABLET | ORAL | Status: DC | PRN

## 2022-10-02 SURGICAL SUPPLY — 6 items
CATH BALLN WEDGE 5F 110CM (CATHETERS) IMPLANT
KIT HEART LEFT (KITS) ×1 IMPLANT
PACK CARDIAC CATHETERIZATION (CUSTOM PROCEDURE TRAY) ×1 IMPLANT
SHEATH GLIDE SLENDER 4/5FR (SHEATH) IMPLANT
TRANSDUCER W/STOPCOCK (MISCELLANEOUS) ×1 IMPLANT
WIRE EMERALD 3MM-J .025X260CM (WIRE) IMPLANT

## 2022-10-02 NOTE — Interval H&P Note (Signed)
History and Physical Interval Note:  10/02/2022 1:44 PM  Annette [name]  has presented today for surgery, with the diagnosis of hp.  The various methods of treatment have been discussed with the patient and family. After consideration of risks, benefits and other options for treatment, the patient has consented to  Procedure(s): RIGHT HEART CATH (N/A) as a surgical intervention.  The patient's history has been reviewed, patient examined, no change in status, stable for surgery.  I have reviewed the patient's chart and labs.  Questions were answered to the patient's satisfaction.     [name]

## 2022-10-02 NOTE — Telephone Encounter (Signed)
1. Stop HCTZ  2. Start Lasix 20 mg daily  3. Start KCl 10 daily.  4. Followup with APP 2-3 weeks to reassess.  Per Dr.MCcLean  Pt contacted she is aware, medication sent to [facility] long, and appt scheduled.

## 2022-10-03 ENCOUNTER — Other Ambulatory Visit (HOSPITAL_COMMUNITY): Payer: Self-pay

## 2022-10-05 ENCOUNTER — Encounter (HOSPITAL_COMMUNITY): Payer: Self-pay | Admitting: Cardiology

## 2022-10-12 ENCOUNTER — Other Ambulatory Visit (HOSPITAL_COMMUNITY): Payer: Self-pay

## 2022-10-12 DIAGNOSIS — L298 Other pruritus: Secondary | ICD-10-CM | POA: Diagnosis not present

## 2022-10-12 DIAGNOSIS — R202 Paresthesia of skin: Secondary | ICD-10-CM | POA: Diagnosis not present

## 2022-10-12 MED ORDER — TRIAMCINOLONE ACETONIDE 0.1 % EX CREA
TOPICAL_CREAM | CUTANEOUS | 1 refills | Status: AC
  Filled 2022-10-12: qty 454, 30d supply, fill #0

## 2022-10-13 ENCOUNTER — Other Ambulatory Visit (HOSPITAL_BASED_OUTPATIENT_CLINIC_OR_DEPARTMENT_OTHER): Payer: Self-pay

## 2022-10-13 ENCOUNTER — Other Ambulatory Visit (HOSPITAL_COMMUNITY): Payer: Self-pay

## 2022-10-13 ENCOUNTER — Other Ambulatory Visit: Payer: Self-pay

## 2022-10-13 ENCOUNTER — Emergency Department (HOSPITAL_BASED_OUTPATIENT_CLINIC_OR_DEPARTMENT_OTHER)
Admission: EM | Admit: 2022-10-13 | Discharge: 2022-10-13 | Disposition: A | Discharge status: Home / Self Care | Payer: PPO | Attending: Emergency Medicine | Admitting: Emergency Medicine

## 2022-10-13 ENCOUNTER — Encounter (HOSPITAL_BASED_OUTPATIENT_CLINIC_OR_DEPARTMENT_OTHER): Payer: Self-pay | Admitting: Emergency Medicine

## 2022-10-13 DIAGNOSIS — Z7982 Long term (current) use of aspirin: Secondary | ICD-10-CM | POA: Diagnosis not present

## 2022-10-13 DIAGNOSIS — R350 Frequency of micturition: Secondary | ICD-10-CM | POA: Insufficient documentation

## 2022-10-13 DIAGNOSIS — R300 Dysuria: Secondary | ICD-10-CM

## 2022-10-13 HISTORY — DX: Heart failure, unspecified: I50.9

## 2022-10-13 LAB — URINALYSIS, W/ REFLEX TO CULTURE (INFECTION SUSPECTED)
Bacteria, UA: NONE SEEN
Bilirubin Urine: NEGATIVE
Glucose, UA: NEGATIVE mg/dL
Hgb urine dipstick: NEGATIVE
Ketones, ur: NEGATIVE mg/dL
Leukocytes,Ua: NEGATIVE
Nitrite: NEGATIVE
Protein, ur: NEGATIVE mg/dL
Specific Gravity, Urine: 1.013 (ref 1.005–1.030)
pH: 5 (ref 5.0–8.0)

## 2022-10-13 MED ORDER — PHENAZOPYRIDINE HCL 200 MG PO TABS
200.0000 mg | ORAL_TABLET | Freq: Three times a day (TID) | ORAL | 0 refills | Status: DC
  Filled 2022-10-13: qty 6, 2d supply, fill #0

## 2022-10-13 MED ORDER — NITROFURANTOIN MONOHYD MACRO 100 MG PO CAPS
100.0000 mg | ORAL_CAPSULE | Freq: Two times a day (BID) | ORAL | 0 refills | Status: DC
  Filled 2022-10-13: qty 10, 5d supply, fill #0

## 2022-10-13 NOTE — ED Provider Notes (Signed)
[facility] Provider Note   CSN: [id number] Arrival date & time: 10/13/22  1017     History  Chief Complaint  Patient presents with   Dysuria    BIONCA LEARNED is a 72 y.o. female.   Dysuria    72 year old female who presents to the emergency department with concern for urinary tract infection.  The patient states that she woke up last night with dysuria and increased urinary frequency that started last night.  She states that symptoms are consistent with prior UTIs.  Denies any hematuria, flank pain, fever or chills.  No abdominal pain, nausea or vomiting.  No vaginal lesions or discharge.  Home Medications Prior to Admission medications   Medication Sig Start Date End Date Taking? Authorizing Provider  nitrofurantoin, macrocrystal-monohydrate, (MACROBID) 100 MG capsule Take 1 capsule (100 mg total) by mouth 2 (two) times daily. 10/13/22  Yes Regan Lemming, MD  phenazopyridine (PYRIDIUM) 200 MG tablet Take 1 tablet (200 mg total) by mouth 3 (three) times daily. 10/13/22  Yes Regan Lemming, MD  albuterol (VENTOLIN HFA) 108 (90 Base) MCG/ACT inhaler Inhale 2 puffs into the lungs every 6 (six) hours as needed for wheezing or shortness of breath. 09/18/22   Magdalen Spatz, NP  aspirin EC 81 MG tablet Take 81 mg by mouth daily.    [provider]  Cholecalciferol (VITAMIN D) 50 MCG (2000 UT) tablet Take 2,000 Units by mouth daily.    [provider]  furosemide (LASIX) 20 MG tablet Take 1 tablet (20 mg total) by mouth daily. 10/02/22 10/02/23  Larey Dresser, MD  guaiFENesin (MUCINEX) 600 MG 12 hr tablet Take 2 tablets (1,200 mg total) by mouth 2 (two) times daily as needed. Patient not taking: Reported on 09/21/2022 01/30/22   Biagio Borg, MD  Javier Docker Oil 1000 MG CAPS Take 1,000 mg by mouth 2 (two) times daily.    [provider]  levothyroxine (SYNTHROID) 125 MCG tablet Take 125 mcg by mouth daily before breakfast.     [provider]  Levothyroxine Sodium 125 MCG/ML SOLN Take 125 mcg (1 ml) by mouth daily. Patient not taking: Reported on 09/29/2022 09/15/22   Hoyt Koch, MD  Magnesium 400 MG TABS Take 400 mg by mouth.    [provider]  Multiple Vitamins-Minerals (PRESERVISION AREDS 2) CAPS Take 1 capsule by mouth 2 (two) times daily.    [provider]  naproxen sodium (ALEVE) 220 MG tablet Take 220 mg by mouth daily as needed (pain).    [provider]  neomycin-polymyxin-dexameth (MAXITROL) 0.1 % OINT Apply 1 Application on eyelid four times a day Patient not taking: Reported on 09/21/2022 08/04/22     potassium chloride (KLOR-CON M) 10 MEQ tablet Take 1 tablet (10 mEq total) by mouth daily. 10/02/22   Larey Dresser, MD  propranolol ER (INDERAL LA) 60 MG 24 hr capsule Take 1 capsule (60 mg total) by mouth daily. 05/12/22     rosuvastatin (CRESTOR) 10 MG tablet Take 1 tablet (10 mg total) by mouth daily. 09/21/22   Larey Dresser, MD  salmeterol (SEREVENT DISKUS) 50 MCG/ACT diskus inhaler Inhale 1 puff into the lungs in the morning and at bedtime. Will need appointment for further refills. 09/18/22   Magdalen Spatz, NP  triamcinolone cream (KENALOG) 0.1 % Apply to back twice daily as needed for itch 10/12/22     valACYclovir (VALTREX) 500 MG tablet Take 1 tablet (500  mg total) by mouth daily. Patient taking differently: Take 500 mg by mouth as needed. 05/19/22         Allergies    Augmentin [amoxicillin-pot clavulanate], Codeine, Levofloxacin, Other, and Iodinated contrast media    Review of Systems   Review of Systems  Genitourinary:  Positive for dysuria and frequency.  All other systems reviewed and are negative.   Physical Exam Updated Vital Signs BP (!) 112/51 (BP Location: Right Arm)   Pulse 76   Temp 98.2 F (36.8 C) (Oral)   Resp 16   SpO2 98%  Physical Exam Vitals and nursing note reviewed.  Constitutional:      General: She is not in acute  distress.    Appearance: She is well-developed.  HENT:     Head: Normocephalic and atraumatic.  Eyes:     Conjunctiva/sclera: Conjunctivae normal.  Cardiovascular:     Rate and Rhythm: Normal rate and regular rhythm.  Pulmonary:     Effort: Pulmonary effort is normal. No respiratory distress.     Breath sounds: Normal breath sounds.  Abdominal:     Palpations: Abdomen is soft.     Tenderness: There is no abdominal tenderness. There is no right CVA tenderness or left CVA tenderness.  Musculoskeletal:        General: No swelling.     Cervical back: Neck supple.  Skin:    General: Skin is warm and dry.     Capillary Refill: Capillary refill takes less than 2 seconds.  Neurological:     Mental Status: She is alert.  Psychiatric:        Mood and Affect: Mood normal.     ED Results / Procedures / Treatments   Labs (all labs ordered are listed, but only abnormal results are displayed) Labs Reviewed  URINALYSIS, W/ REFLEX TO CULTURE (INFECTION SUSPECTED)    EKG None  Radiology No results found.  Procedures Procedures    Medications Ordered in ED Medications - No data to display  ED Course/ Medical Decision Making/ A&P                             Medical Decision Making Amount and/or Complexity of Data Reviewed Labs: ordered.  Risk Prescription drug management.    72 year old female who presents to the emergency department with concern for urinary tract infection.  The patient states that she woke up last night with dysuria and increased urinary frequency that started last night.  She states that symptoms are consistent with prior UTIs.  Denies any hematuria, flank pain, fever or chills.  No abdominal pain, nausea or vomiting.  No vaginal lesions or discharge.  On arrival, the patient was vitally stable.  Urinalysis unremarkable, urine culture pending.  Despite unremarkable urinalysis, will provide the patient with a prescription for Macrobid given her symptoms  being consistent with UTI.  Will follow-up urine culture results, will also prescribe Pyridium.  Repeat assessment, the patient is overall well-appearing and stable.  Stable for discharge, advised PCP follow-up.  Final Clinical Impression(s) / ED Diagnoses Final diagnoses:  Dysuria    Rx / DC Orders ED Discharge Orders          Ordered    phenazopyridine (PYRIDIUM) 200 MG tablet  3 times daily        10/13/22 1224    nitrofurantoin, macrocrystal-monohydrate, (MACROBID) 100 MG capsule  2 times daily        10/13/22 1224  Regan Lemming, MD 10/13/22 1226

## 2022-10-13 NOTE — Discharge Instructions (Addendum)
Your urinalysis was without evidence of urinary tract infection.  I will prescribe Macrobid in the event you have persistent worsening symptoms and we will follow-up on your urine culture, follow-up with your PCP.

## 2022-10-13 NOTE — ED Triage Notes (Signed)
Pt reports dysuria and urinary frequency that started last night.

## 2022-10-21 ENCOUNTER — Telehealth: Payer: Self-pay

## 2022-10-21 NOTE — Telephone Encounter (Signed)
     Patient  visit on 2/27  at [facility]    Have you been able to follow up with your primary care physician? No   The patient was or was not able to obtain any needed medicine or equipment. Yes   Are there diet recommendations that you are having difficulty following? Na   Patient expresses understanding of discharge instructions and education provided has no other needs at this time.  Yes      [facility] [phone] [address], [place], [place] [address] Phone: [phone] Email: [email or web address]

## 2022-10-23 NOTE — Progress Notes (Signed)
PCP: Hoyt Koch, MD Cardiology: Dr Aundra Dubin  72 y.o. with history of HTN and COPD was referred by Dr. Sharlet Salina for evaluation of pulmonary hypertension.  Patient was a prior smoker, quit in 2010.  She has CAD noted by coronary calcification on chest CT but Cardiolite in 11/20 was normal.  She had a chest CT for lung cancer screening in 9/23, this showed an enlarged pulmonary artery concerning for pulmonary hypertension. She had an echo done after this showing EF 60-65%, normal RV, but no TR jet so unable to estimate PA systolic pressure.    She first saw Dr. Aundra Dubin 09/21/22, with NYHA II symptoms. With mild dyspnea and hx of COPD, there was concern for group 3 pulmonary HTN, and RHC was arranged.    RHC (2/24) showed mildly elevated PCWP and mild pulmonary venous hypertension. Suspect diastolic heart failure as findings not consistent with pulmonary arterial hypertension. HCTZ stopped and Lasix started.  Today she returns for [facility] HF follow up. Overall feeling fine. She feels breathing has improved and feels she can now take a deep breath. She has mild dyspnea walking up steps.  She walks on the TM 20-30 mins x 4 days a week. She has occasional dizziness she attributes to recent diet changes.  Denies palpitations, CP, edema, or PND/Orthopnea. Appetite ok. No fever or chills. Weight at home 210 pounds. Taking all medications.   ECG (personally reviewed): SB 59 bpm   Labs (10/23): LDL 78, K 4.3, creatinine 0.75 Labs (2/24): K 3.6, creatinine 0.77  PMH: 1. HTN 2. Hyperlipidemia 3. COPD: Quit smoking in 2010.  4. CAD: Calcified coronaries noted on prior CT chest.  - Cardiolite (11/20): normal 5. Familial tremor 6. H/o THR 7. Diastolic Heart Failure: - Echo (11/23): EF 60-65%, RV normal, unable to estimate PA systolic pressure.  - RHC (2/24): RA mean 8, PA 41/16 ( mean 27), PCWP mean 17, CO/CI (Fick) 6.32/3.08, PVR 1.6 WU  Social History   Socioeconomic History   Marital status:  Divorced    Spouse name: Not on file   Number of children: 1   Years of education: Not on file   Highest education level: Bachelor's degree (e.g., BA, AB, BS)  Occupational History   Occupation: retired Therapist, sports    Comment: RN endoscopyt  Tobacco Use   Smoking status: Former    Packs/day: 1.00    Years: 38.00    Total pack years: 38.00    Types: Cigarettes    Quit date: 03/14/2009    Years since quitting: 13.6   Smokeless tobacco: Never  Substance and Sexual Activity   Alcohol use: Yes    Comment: beer every 6-8 months   Drug use: No   Sexual activity: Not on file  Other Topics Concern   Not on file  Social History Narrative   [facility]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 1 son   1 caffeine drink daily   Updated 06/01/2013   Social Determinants of Health   Financial Resource Strain: Not on file  Food Insecurity: Not on file  Transportation Needs: Not on file  Physical Activity: Not on file  Stress: Not on file  Social Connections: Not on file  Intimate Partner Violence: Not on file   Family History  Problem Relation Age of Onset   Alzheimer's disease Father        developed CAD late in life   High blood pressure Father    High Cholesterol Father    Heart disease  Father    AAA (abdominal aortic aneurysm) Father    Stroke Mother        died @ ~81 y/o   CAD Mother 49       s/p CABG   High blood pressure Mother    High Cholesterol Mother    Heart disease Mother    Thyroid disease Mother    Anxiety disorder Mother    Alcoholism Mother    Breast cancer Sister    ROS: All systems reviewed and negative except as per HPI.   Current Outpatient Medications  Medication Sig Dispense Refill   albuterol (VENTOLIN HFA) 108 (90 Base) MCG/ACT inhaler Inhale 2 puffs into the lungs every 6 (six) hours as needed for wheezing or shortness of breath. 6.7 g 5   aspirin EC 81 MG tablet Take 81 mg by mouth daily.     Cholecalciferol (VITAMIN D) 50 MCG (2000 UT) tablet Take 2,000 Units by  mouth daily.     Fexofenadine-Pseudoephedrine (ALLEGRA-D 24 HOUR PO) daily.     furosemide (LASIX) 20 MG tablet Take 1 tablet (20 mg total) by mouth daily. 30 tablet 11   guaiFENesin (MUCINEX) 600 MG 12 hr tablet Take 2 tablets (1,200 mg total) by mouth 2 (two) times daily as needed. 60 tablet 1   Krill Oil 1000 MG CAPS Take 1,000 mg by mouth 2 (two) times daily.     levothyroxine (SYNTHROID) 125 MCG tablet Take 125 mcg by mouth daily before breakfast.     Magnesium 400 MG TABS Take 400 mg by mouth.     Multiple Vitamins-Minerals (PRESERVISION AREDS 2) CAPS Take 1 capsule by mouth 2 (two) times daily.     naproxen sodium (ALEVE) 220 MG tablet Take 220 mg by mouth daily as needed (pain).     potassium chloride (KLOR-CON M) 10 MEQ tablet Take 1 tablet (10 mEq total) by mouth daily. 90 tablet 3   propranolol ER (INDERAL LA) 60 MG 24 hr capsule Take 1 capsule (60 mg total) by mouth daily. 90 capsule 3   rosuvastatin (CRESTOR) 10 MG tablet Take 1 tablet (10 mg total) by mouth daily. 90 tablet 3   salmeterol (SEREVENT DISKUS) 50 MCG/ACT diskus inhaler Inhale 1 puff into the lungs in the morning and at bedtime. Will need appointment for further refills. 180 each 0   triamcinolone cream (KENALOG) 0.1 % Apply to back twice daily as needed for itch 454 g 1   valACYclovir (VALTREX) 500 MG tablet Take 1 tablet (500 mg total) by mouth daily. (Patient taking differently: Take 500 mg by mouth as needed.) 30 tablet 2   No current facility-administered medications for this encounter.   Wt Readings from Last 3 Encounters:  10/26/22 96.3 kg (212 lb 6.4 oz)  10/02/22 94.3 kg (208 lb)  09/21/22 95.8 kg (211 lb 3.2 oz)   BP 120/72   Pulse (!) 57   Wt 96.3 kg (212 lb 6.4 oz)   SpO2 97%   BMI 33.27 kg/m  Physical Exam General:  NAD. No resp difficulty, walked into clinic. HEENT: Normal Neck: Supple. No JVD. Carotids 2+ bilat; no bruits. No lymphadenopathy or thryomegaly appreciated. Cor: PMI nondisplaced.  Regular rate & rhythm. No rubs, gallops or murmurs. Lungs: Clear Abdomen: Soft, soft, nontender, nondistended. No hepatosplenomegaly. No bruits or masses. Good bowel sounds. Extremities: No cyanosis, clubbing, rash, edema Neuro: Alert & oriented x 3, cranial nerves grossly intact. Moves all 4 extremities w/o difficulty. Affect pleasant.  Assessment/Plan: 1. Pulmonary  hypertension: Concern for pulmonary hypertension based on CT chest showing enlarged pulmonary artery.  Echo in 11/23 showed EF 60-65%, RV normal, unable to estimate PA systolic pressure. She does carry a diagnosis of COPD and was a prior smoker. Patient certainly could have mild group 3 pulmonary hypertension due to COPD.  RHC (2/24) showed mildly elevated PCWP and mild pulmonary venous hypertension. Suspect diastolic heart failure as findings not consistent with pulmonary arterial hypertension. NYHA II symptoms, she is not volume overloaded today. - Continue Lasix 20 mg daily + 10 KCL daily.  - Check BMET and BNP today.  2. CAD: Patient has known coronary calcification by prior CT chests.  She had a Cardiolite in 2020 with no ischemia.  She has no chest pain. - Continue Crestor 10 mg daily (recently increased). Goal LDL < 70, check lipids/LFTs in 1 month (given Rx to have drawn at PCP office).  3. HTN: BP stable. - Continue current medications 4. Overweight: Body mass index is 33.27 kg/m. - She has lost >20 lbs, she is working on diet and exercise. - Discussed GLP-1's today. She does not have diabetes, but will refer to Pharmacy to see if can arrange this for her.  Follow up in 4 months with Dr. Wynema Birch [facility] FNP-BC 10/26/2022

## 2022-10-26 ENCOUNTER — Encounter (HOSPITAL_COMMUNITY): Payer: Self-pay

## 2022-10-26 ENCOUNTER — Other Ambulatory Visit (HOSPITAL_COMMUNITY): Payer: Self-pay

## 2022-10-26 ENCOUNTER — Ambulatory Visit (HOSPITAL_COMMUNITY)
Admit: 2022-10-26 | Discharge: 2022-10-26 | Disposition: A | Discharge status: Home / Self Care | Payer: PPO | Attending: Family Medicine | Admitting: Family Medicine

## 2022-10-26 VITALS — BP 120/72 | HR 57 | Wt 212.4 lb

## 2022-10-26 DIAGNOSIS — I10 Essential (primary) hypertension: Secondary | ICD-10-CM | POA: Diagnosis not present

## 2022-10-26 DIAGNOSIS — Z87891 Personal history of nicotine dependence: Secondary | ICD-10-CM | POA: Diagnosis not present

## 2022-10-26 DIAGNOSIS — Z6833 Body mass index (BMI) 33.0-33.9, adult: Secondary | ICD-10-CM | POA: Insufficient documentation

## 2022-10-26 DIAGNOSIS — J449 Chronic obstructive pulmonary disease, unspecified: Secondary | ICD-10-CM | POA: Insufficient documentation

## 2022-10-26 DIAGNOSIS — I5032 Chronic diastolic (congestive) heart failure: Secondary | ICD-10-CM | POA: Diagnosis not present

## 2022-10-26 DIAGNOSIS — I272 Pulmonary hypertension, unspecified: Secondary | ICD-10-CM | POA: Insufficient documentation

## 2022-10-26 DIAGNOSIS — I2510 Atherosclerotic heart disease of native coronary artery without angina pectoris: Secondary | ICD-10-CM | POA: Diagnosis not present

## 2022-10-26 DIAGNOSIS — E663 Overweight: Secondary | ICD-10-CM

## 2022-10-26 LAB — BASIC METABOLIC PANEL
Anion gap: 6 (ref 5–15)
BUN: 15 mg/dL (ref 8–23)
CO2: 33 mmol/L — ABNORMAL HIGH (ref 22–32)
Calcium: 9.5 mg/dL (ref 8.9–10.3)
Chloride: 99 mmol/L (ref 98–111)
Creatinine, Ser: 0.89 mg/dL (ref 0.44–1.00)
GFR, Estimated: >60 mL/min (ref >60)
Glucose, Bld: 90 mg/dL (ref 70–99)
Potassium: 4.8 mmol/L (ref 3.5–5.1)
Sodium: 138 mmol/L (ref 135–145)

## 2022-10-26 LAB — BRAIN NATRIURETIC PEPTIDE: B Natriuretic Peptide: 86.4 pg/mL (ref 0.0–100.0)

## 2022-10-26 MED ORDER — FUROSEMIDE 20 MG PO TABS
20.0000 mg | ORAL_TABLET | Freq: Every day | ORAL | 3 refills | Status: DC
  Filled 2022-10-26: qty 90, 90d supply, fill #0
  Filled 2023-01-24: qty 90, 90d supply, fill #1

## 2022-10-26 MED ORDER — ROSUVASTATIN CALCIUM 10 MG PO TABS
10.0000 mg | ORAL_TABLET | Freq: Every day | ORAL | 3 refills | Status: DC
  Filled 2022-10-26 – 2022-12-15 (×2): qty 90, 90d supply, fill #0
  Filled 2023-03-26: qty 90, 90d supply, fill #1
  Filled 2023-06-27: qty 90, 90d supply, fill #2

## 2022-10-26 MED ORDER — POTASSIUM CHLORIDE CRYS ER 10 MEQ PO TBCR
10.0000 meq | EXTENDED_RELEASE_TABLET | Freq: Every day | ORAL | 3 refills | Status: DC
  Filled 2022-10-26 – 2022-12-27 (×2): qty 90, 90d supply, fill #0

## 2022-10-26 NOTE — Patient Instructions (Addendum)
EKG done today.  Labs done today. We will contact you only if your labs are abnormal.  Your Lasix, Potassium and Crestor has been refilled for a 90 day supply.   No medication changes were made. Please continue all current medications as prescribed.  You have been referred to the Pharmacy Clinic. They will contact you to schedule an appointment.   Your physician recommends that you schedule a follow-up appointment in: 1 month for a lab only appointment that can be done at your primary care physician(a paper prescription was provided to you during your  appointment today) and in 4 months with Dr. Aundra Dubin. Please contact our office in May to schedule a July appointment.   If you have any questions or concerns before your next appointment please send [place] a message through [place] or call our office at [phone].    TO LEAVE A MESSAGE FOR THE NURSE SELECT OPTION 2, PLEASE LEAVE A MESSAGE INCLUDING: YOUR NAME DATE OF BIRTH CALL BACK NUMBER REASON FOR CALL**this is important as we prioritize the call backs  YOU WILL RECEIVE A CALL BACK THE SAME DAY AS LONG AS YOU CALL BEFORE 4:00 PM   Do the following things EVERYDAY: Weigh yourself in the morning before breakfast. Write it down and keep it in a log. Take your medicines as prescribed Eat low salt foods--Limit salt (sodium) to 2000 mg per day.  Stay as active as you can everyday Limit all fluids for the day to less than 2 liters   At the [facility], you and your health needs are our priority. As part of our continuing mission to provide you with exceptional heart care, we have created designated Provider Care Teams. These Care Teams include your primary Cardiologist (physician) and Advanced Practice Providers (APPs- Physician Assistants and Nurse Practitioners) who all work together to provide you with the care you need, when you need it.   You may see any of the following providers on your designated Care Team at your  next follow up: Dr Glori Bickers Dr Haynes Kerns, NP Lyda Jester, [place] Audry Riles, PharmD   Please be sure to bring in all your medications bottles to every appointment.

## 2022-10-27 ENCOUNTER — Other Ambulatory Visit (HOSPITAL_COMMUNITY): Payer: Self-pay

## 2022-10-27 DIAGNOSIS — L570 Actinic keratosis: Secondary | ICD-10-CM | POA: Diagnosis not present

## 2022-10-27 DIAGNOSIS — L814 Other melanin hyperpigmentation: Secondary | ICD-10-CM | POA: Diagnosis not present

## 2022-10-27 DIAGNOSIS — L578 Other skin changes due to chronic exposure to nonionizing radiation: Secondary | ICD-10-CM | POA: Diagnosis not present

## 2022-10-27 DIAGNOSIS — L821 Other seborrheic keratosis: Secondary | ICD-10-CM | POA: Diagnosis not present

## 2022-10-27 MED ORDER — FLUOROURACIL 5 % EX CREA
TOPICAL_CREAM | CUTANEOUS | 1 refills | Status: DC
  Filled 2022-10-27: qty 40, 30d supply, fill #0

## 2022-11-16 ENCOUNTER — Other Ambulatory Visit: Payer: Self-pay | Admitting: Internal Medicine

## 2022-11-16 ENCOUNTER — Telehealth: Payer: Self-pay | Admitting: Internal Medicine

## 2022-11-16 ENCOUNTER — Other Ambulatory Visit (HOSPITAL_COMMUNITY): Payer: Self-pay

## 2022-11-16 NOTE — Telephone Encounter (Signed)
Patient states that our office discontinued her  levothyroxine (SYNTHROID) 125 MCG tablet and she cannot get a refill until we send it in again. She is currently out of medication and needs new RX sent to  [facility]   Patient's call back number is [phone]

## 2022-11-16 NOTE — Telephone Encounter (Signed)
Patient called and said she is completely out of her medication. She has not had it for the past two days. She would like it to be filled ASAP.

## 2022-11-17 ENCOUNTER — Encounter: Payer: Self-pay | Admitting: Internal Medicine

## 2022-11-17 ENCOUNTER — Other Ambulatory Visit (HOSPITAL_COMMUNITY): Payer: Self-pay

## 2022-11-17 ENCOUNTER — Other Ambulatory Visit: Payer: Self-pay

## 2022-11-17 ENCOUNTER — Other Ambulatory Visit: Payer: Self-pay | Admitting: Internal Medicine

## 2022-11-17 MED ORDER — LEVOTHYROXINE SODIUM 125 MCG PO TABS
125.0000 ug | ORAL_TABLET | Freq: Every day | ORAL | 3 refills | Status: DC
  Filled 2022-11-17: qty 90, 90d supply, fill #0
  Filled 2023-02-07: qty 90, 90d supply, fill #1
  Filled 2023-05-09: qty 90, 90d supply, fill #2
  Filled 2023-08-15: qty 90, 90d supply, fill #3

## 2022-11-17 NOTE — Telephone Encounter (Signed)
Notified pt rx has been sent to pharmacy../lmb 

## 2022-11-17 NOTE — Telephone Encounter (Signed)
PT calls back and notes that they would like the tablet levothyroxine (SYNTHROID) 125 MCG tablet . The liquid form was something that was talked about in the past but was not cost effective for PT. PT is requesting a CB if possible. PT has noted it has been three days since last dosage of this RX.  CB: [phone]

## 2022-11-17 NOTE — Telephone Encounter (Signed)
We had switched her to liquid per her preference at our last visit with her. It looks like cardiology canceled this on 10/26/22 at their visit. Is she desiring the liquid or tablet form of her medication?

## 2022-11-17 NOTE — Telephone Encounter (Signed)
Patient would like a call back about why her levothyroxine has not been refilled. She would like a call back ASAP. Best call back is [phone].

## 2022-11-26 ENCOUNTER — Telehealth (HOSPITAL_COMMUNITY): Payer: Self-pay | Admitting: Cardiology

## 2022-11-26 ENCOUNTER — Other Ambulatory Visit: Payer: PPO

## 2022-11-26 DIAGNOSIS — E7849 Other hyperlipidemia: Secondary | ICD-10-CM

## 2022-11-26 NOTE — Telephone Encounter (Signed)
Pt unable to have labs at pcp Lab appt scheduled at [facility] and LFT Orders placed

## 2022-11-30 ENCOUNTER — Ambulatory Visit (HOSPITAL_COMMUNITY)
Admission: RE | Admit: 2022-11-30 | Discharge: 2022-11-30 | Disposition: A | Discharge status: Home / Self Care | Payer: PPO | Source: Ambulatory Visit | Attending: Internal Medicine | Admitting: Internal Medicine

## 2022-11-30 DIAGNOSIS — R202 Paresthesia of skin: Secondary | ICD-10-CM | POA: Diagnosis not present

## 2022-11-30 DIAGNOSIS — E7849 Other hyperlipidemia: Secondary | ICD-10-CM | POA: Insufficient documentation

## 2022-11-30 LAB — HEPATIC FUNCTION PANEL
ALT: 11 U/L (ref 0–44)
AST: 19 U/L (ref 15–41)
Albumin: 3.4 g/dL — ABNORMAL LOW (ref 3.5–5.0)
Alkaline Phosphatase: 99 U/L (ref 38–126)
Bilirubin, Direct: <0.1 mg/dL (ref 0.0–0.2)
Total Bilirubin: 0.6 mg/dL (ref 0.3–1.2)
Total Protein: 7.2 g/dL (ref 6.5–8.1)

## 2022-11-30 LAB — LIPID PANEL
Cholesterol: 150 mg/dL (ref 0–200)
HDL: 52 mg/dL (ref >40)
LDL Cholesterol: 67 mg/dL (ref 0–99)
Total CHOL/HDL Ratio: 2.9 RATIO
Triglycerides: 153 mg/dL — ABNORMAL HIGH (ref <150)
VLDL: 31 mg/dL (ref 0–40)

## 2022-12-01 ENCOUNTER — Telehealth: Payer: Self-pay

## 2022-12-01 NOTE — Telephone Encounter (Signed)
Called patient to schedule Medicare Annual Wellness Visit (AWV). Unable to reach patient.   Please schedule an appointment at any time on Annual Wellness Schedule.

## 2022-12-07 ENCOUNTER — Other Ambulatory Visit (HOSPITAL_COMMUNITY): Payer: Self-pay

## 2022-12-15 ENCOUNTER — Other Ambulatory Visit (HOSPITAL_COMMUNITY): Payer: Self-pay

## 2022-12-16 ENCOUNTER — Other Ambulatory Visit (HOSPITAL_COMMUNITY): Payer: Self-pay

## 2022-12-28 ENCOUNTER — Other Ambulatory Visit (HOSPITAL_COMMUNITY): Payer: Self-pay

## 2022-12-29 DIAGNOSIS — Z872 Personal history of diseases of the skin and subcutaneous tissue: Secondary | ICD-10-CM | POA: Diagnosis not present

## 2022-12-29 DIAGNOSIS — Z09 Encounter for follow-up examination after completed treatment for conditions other than malignant neoplasm: Secondary | ICD-10-CM | POA: Diagnosis not present

## 2022-12-29 DIAGNOSIS — L578 Other skin changes due to chronic exposure to nonionizing radiation: Secondary | ICD-10-CM | POA: Diagnosis not present

## 2022-12-29 DIAGNOSIS — L814 Other melanin hyperpigmentation: Secondary | ICD-10-CM | POA: Diagnosis not present

## 2023-02-10 ENCOUNTER — Other Ambulatory Visit (HOSPITAL_COMMUNITY): Payer: Self-pay

## 2023-02-15 ENCOUNTER — Other Ambulatory Visit (HOSPITAL_COMMUNITY): Payer: Self-pay

## 2023-03-01 ENCOUNTER — Other Ambulatory Visit (HOSPITAL_COMMUNITY): Payer: Self-pay

## 2023-03-01 DIAGNOSIS — H43393 Other vitreous opacities, bilateral: Secondary | ICD-10-CM | POA: Diagnosis not present

## 2023-03-01 DIAGNOSIS — H04123 Dry eye syndrome of bilateral lacrimal glands: Secondary | ICD-10-CM | POA: Diagnosis not present

## 2023-03-01 DIAGNOSIS — H353132 Nonexudative age-related macular degeneration, bilateral, intermediate dry stage: Secondary | ICD-10-CM | POA: Diagnosis not present

## 2023-03-01 DIAGNOSIS — H26493 Other secondary cataract, bilateral: Secondary | ICD-10-CM | POA: Diagnosis not present

## 2023-03-01 MED ORDER — NEOMYCIN-POLYMYXIN-DEXAMETH 0.1 % OP OINT
TOPICAL_OINTMENT | OPHTHALMIC | 2 refills | Status: DC
  Filled 2023-03-01: qty 3.5, 7d supply, fill #0

## 2023-03-02 ENCOUNTER — Other Ambulatory Visit: Payer: Self-pay | Admitting: Acute Care

## 2023-03-02 DIAGNOSIS — J441 Chronic obstructive pulmonary disease with (acute) exacerbation: Secondary | ICD-10-CM

## 2023-03-03 ENCOUNTER — Telehealth: Payer: Self-pay | Admitting: Emergency Medicine

## 2023-03-03 ENCOUNTER — Other Ambulatory Visit (HOSPITAL_COMMUNITY): Payer: Self-pay

## 2023-03-04 ENCOUNTER — Other Ambulatory Visit (HOSPITAL_COMMUNITY): Payer: Self-pay

## 2023-03-04 MED ORDER — SEREVENT DISKUS 50 MCG/ACT IN AEPB
1.0000 | INHALATION_SPRAY | Freq: Two times a day (BID) | RESPIRATORY_TRACT | 1 refills | Status: DC
  Filled 2023-03-04: qty 180, 90d supply, fill #0
  Filled 2023-05-31: qty 180, 90d supply, fill #1
  Filled 2023-06-01 – 2023-06-02 (×3): qty 60, 30d supply, fill #1
  Filled 2023-07-06: qty 60, 30d supply, fill #2
  Filled 2023-09-03 (×2): qty 60, 30d supply, fill #3

## 2023-03-04 NOTE — Telephone Encounter (Signed)
Called and spoke with patient, she states that she got 3 boxes of her Serevent Diskus inhaler, when she got to the 3rd box, it was empty.  She contacted the pharmacy for a refill and found out she had no more refills.  At that time she explained to the pharmacy that her last box was empty.  I advised that I would send it in right now.  She verbalized understanding.  Noting further needed.

## 2023-03-05 ENCOUNTER — Other Ambulatory Visit (HOSPITAL_COMMUNITY): Payer: Self-pay

## 2023-03-12 ENCOUNTER — Ambulatory Visit (HOSPITAL_COMMUNITY)
Admission: RE | Admit: 2023-03-12 | Discharge: 2023-03-12 | Disposition: A | Discharge status: Home / Self Care | Payer: PPO | Source: Ambulatory Visit | Attending: Cardiology | Admitting: Cardiology

## 2023-03-12 ENCOUNTER — Other Ambulatory Visit (HOSPITAL_COMMUNITY): Payer: Self-pay

## 2023-03-12 ENCOUNTER — Encounter (HOSPITAL_COMMUNITY): Payer: Self-pay | Admitting: Cardiology

## 2023-03-12 VITALS — BP 140/80 | HR 66 | Wt 224.4 lb

## 2023-03-12 DIAGNOSIS — Z79899 Other long term (current) drug therapy: Secondary | ICD-10-CM | POA: Diagnosis not present

## 2023-03-12 DIAGNOSIS — I110 Hypertensive heart disease with heart failure: Secondary | ICD-10-CM | POA: Insufficient documentation

## 2023-03-12 DIAGNOSIS — Z6835 Body mass index (BMI) 35.0-35.9, adult: Secondary | ICD-10-CM | POA: Insufficient documentation

## 2023-03-12 DIAGNOSIS — I2721 Secondary pulmonary arterial hypertension: Secondary | ICD-10-CM

## 2023-03-12 DIAGNOSIS — E663 Overweight: Secondary | ICD-10-CM | POA: Diagnosis not present

## 2023-03-12 DIAGNOSIS — I5032 Chronic diastolic (congestive) heart failure: Secondary | ICD-10-CM | POA: Diagnosis not present

## 2023-03-12 DIAGNOSIS — Z87891 Personal history of nicotine dependence: Secondary | ICD-10-CM | POA: Insufficient documentation

## 2023-03-12 DIAGNOSIS — I272 Pulmonary hypertension, unspecified: Secondary | ICD-10-CM | POA: Insufficient documentation

## 2023-03-12 DIAGNOSIS — I2510 Atherosclerotic heart disease of native coronary artery without angina pectoris: Secondary | ICD-10-CM | POA: Insufficient documentation

## 2023-03-12 DIAGNOSIS — J449 Chronic obstructive pulmonary disease, unspecified: Secondary | ICD-10-CM | POA: Diagnosis not present

## 2023-03-12 LAB — BRAIN NATRIURETIC PEPTIDE: B Natriuretic Peptide: 42 pg/mL (ref 0.0–100.0)

## 2023-03-12 LAB — BASIC METABOLIC PANEL
Anion gap: 11 (ref 5–15)
BUN: 24 mg/dL — ABNORMAL HIGH (ref 8–23)
CO2: 30 mmol/L (ref 22–32)
Calcium: 9.5 mg/dL (ref 8.9–10.3)
Chloride: 97 mmol/L — ABNORMAL LOW (ref 98–111)
Creatinine, Ser: 0.89 mg/dL (ref 0.44–1.00)
GFR, Estimated: >60 mL/min (ref >60)
Glucose, Bld: 104 mg/dL — ABNORMAL HIGH (ref 70–99)
Potassium: 5.2 mmol/L — ABNORMAL HIGH (ref 3.5–5.1)
Sodium: 138 mmol/L (ref 135–145)

## 2023-03-12 MED ORDER — DAPAGLIFLOZIN PROPANEDIOL 10 MG PO TABS
10.0000 mg | ORAL_TABLET | Freq: Every day | ORAL | 3 refills | Status: DC
  Filled 2023-03-12: qty 90, 90d supply, fill #0
  Filled 2023-05-31: qty 90, 90d supply, fill #1
  Filled 2023-06-01 (×2): qty 30, 30d supply, fill #1
  Filled 2023-07-06: qty 30, 30d supply, fill #2
  Filled 2023-08-02: qty 30, 30d supply, fill #3
  Filled 2023-09-02: qty 90, 90d supply, fill #4
  Filled 2023-11-29: qty 90, 90d supply, fill #5

## 2023-03-12 MED ORDER — POTASSIUM CHLORIDE CRYS ER 10 MEQ PO TBCR: 10.0000 meq | EXTENDED_RELEASE_TABLET | ORAL | Status: DC

## 2023-03-12 MED ORDER — FUROSEMIDE 20 MG PO TABS: 20.0000 mg | ORAL_TABLET | ORAL | Status: DC

## 2023-03-12 NOTE — Patient Instructions (Signed)
DECREASE Lasix to 20 mg every other day.  DECREASE Potassium to 10 mEq every other day   Labs done today, your results will be available in MyChart, we will contact you for abnormal readings.  Repeat blood work in 10 days.  Your physician recommends that you schedule a follow-up appointment in: 6 months ( January 2025) ** PLEASE CALL THE OFFICE IN NOVEMBER TO ARRANGE YOUR FOLLOW UP APPOINTMENT. **  If you have any questions or concerns before your next appointment please send [place] a message through [place] or call our office at [phone].    TO LEAVE A MESSAGE FOR THE NURSE SELECT OPTION 2, PLEASE LEAVE A MESSAGE INCLUDING: YOUR NAME DATE OF BIRTH CALL BACK NUMBER REASON FOR CALL**this is important as we prioritize the call backs  YOU WILL RECEIVE A CALL BACK THE SAME DAY AS LONG AS YOU CALL BEFORE 4:00 PM  At the [facility], you and your health needs are our priority. As part of our continuing mission to provide you with exceptional heart care, we have created designated Provider Care Teams. These Care Teams include your primary Cardiologist (physician) and Advanced Practice Providers (APPs- Physician Assistants and Nurse Practitioners) who all work together to provide you with the care you need, when you need it.   You may see any of the following providers on your designated Care Team at your next follow up: Dr Arvilla Meres Dr Marca Ancona Dr. Marcos Eke, NP Robbie Lis, [place] [facility] [place], [place] Brynda Peon, NP Karle Plumber, PharmD   Please be sure to bring in all your medications bottles to every appointment.    Thank you for choosing [facility]

## 2023-03-14 NOTE — Progress Notes (Signed)
PCP: Myrlene Broker, MD Cardiology: Dr Shirlee Latch  72 y.o. with history of HTN and COPD was referred by Dr. Okey Dupre for evaluation of pulmonary hypertension.  Patient was a prior smoker, quit in 2010.  She has CAD noted by coronary calcification on chest CT but Cardiolite in 11/20 was normal.  She had a chest CT for lung cancer screening in 9/23, this showed an enlarged pulmonary artery concerning for pulmonary hypertension. She had an echo done after this showing EF 60-65%, normal RV, but no TR jet so unable to estimate PA systolic pressure.    She first saw Dr. Shirlee Latch 09/21/22, with NYHA II symptoms. With mild dyspnea and hx of COPD, there was concern for group 3 pulmonary HTN, and RHC was arranged.  RHC (2/24) showed mildly elevated PCWP and mild pulmonary venous hypertension. Suspect diastolic heart failure as findings not consistent with pulmonary arterial hypertension. HCTZ stopped and Lasix started.  She returns today for followup of diastolic CHF. She reports dyspnea walking in the heat and dyspnea walking up 1 flight of stairs.  Weight is up 12 lbs.  She does report dietary indiscretion, eating poorly.  She has been walking daily on the treadmill for 20 minutes. SBP 110s-120s.  No chest pain.  BP borderline elevated.    ECG (personally reviewed): NSR, normal   Labs (10/23): LDL 78, K 4.3, creatinine 6.29 Labs (2/24): K 3.6, creatinine 0.77 Labs (3/24): BNP 86 Labs (4/24): LDL 67  PMH: 1. HTN 2. Hyperlipidemia 3. COPD: Quit smoking in 2010.  4. CAD: Calcified coronaries noted on prior CT chest.  - Cardiolite (11/20): normal 5. Familial tremor 6. H/o THR 7. Diastolic Heart Failure: - Echo (11/23): EF 60-65%, RV normal, unable to estimate PA systolic pressure.  - RHC (2/24): RA mean 8, PA 41/16 ( mean 27), PCWP mean 17, CO/CI (Fick) 6.32/3.08, PVR 1.6 WU  Social History   Socioeconomic History   Marital status: Divorced    Spouse name: Not on file   Number of children: 1    Years of education: Not on file   Highest education level: Bachelor's degree (e.g., BA, AB, BS)  Occupational History   Occupation: retired [employer]    Comment: RN endoscopyt  Tobacco Use   Smoking status: Former    Current packs/day: 0.00    Average packs/day: 1 pack/day for 38.0 years (38.0 ttl pk-yrs)    Types: Cigarettes    Start date: 03/15/1971    Quit date: 03/14/2009    Years since quitting: 14.0   Smokeless tobacco: Never  Substance and Sexual Activity   Alcohol use: Yes    Comment: beer every 6-8 months   Drug use: No   Sexual activity: Not on file  Other Topics Concern   Not on file  Social History Narrative   [employer]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 1 son   1 caffeine drink daily   Updated 06/01/2013   Social Determinants of Health   Financial Resource Strain: Not on file  Food Insecurity: Not on file  Transportation Needs: Not on file  Physical Activity: Not on file  Stress: Not on file  Social Connections: Not on file  Intimate Partner Violence: Not on file   Family History  Problem Relation Age of Onset   Alzheimer's disease Father        developed CAD late in life   High blood pressure Father    High Cholesterol Father    Heart disease Father  AAA (abdominal aortic aneurysm) Father    Stroke Mother        died @ ~4 y/o   CAD Mother 35       s/p CABG   High blood pressure Mother    High Cholesterol Mother    Heart disease Mother    Thyroid disease Mother    Anxiety disorder Mother    Alcoholism Mother    Breast cancer Sister    ROS: All systems reviewed and negative except as per HPI.   Current Outpatient Medications  Medication Sig Dispense Refill   albuterol (VENTOLIN HFA) 108 (90 Base) MCG/ACT inhaler Inhale 2 puffs into the lungs every 6 (six) hours as needed for wheezing or shortness of breath. 6.7 g 5   aspirin EC 81 MG tablet Take 81 mg by mouth daily.     Cholecalciferol (VITAMIN D) 50 MCG (2000 UT) tablet Take 2,000 Units by mouth daily.      dapagliflozin propanediol (FARXIGA) 10 MG TABS tablet Take 1 tablet (10 mg) by mouth daily before breakfast. 90 tablet 3   fexofenadine (ALLEGRA) 180 MG tablet Take 180 mg by mouth 2 (two) times daily.     Fexofenadine-Pseudoephedrine (ALLEGRA-D 24 HOUR PO) daily.     guaiFENesin (MUCINEX) 600 MG 12 hr tablet Take 600 mg by mouth as needed.     Krill Oil 1000 MG CAPS Take 1,000 mg by mouth 2 (two) times daily.     levothyroxine (SYNTHROID) 125 MCG tablet Take 1 tablet (125 mcg total) by mouth daily before breakfast. 90 tablet 3   Magnesium 400 MG TABS Take 400 mg by mouth.     Multiple Vitamins-Minerals (PRESERVISION AREDS 2) CAPS Take 1 capsule by mouth 2 (two) times daily.     naproxen sodium (ALEVE) 220 MG tablet Take 220 mg by mouth daily as needed (pain).     neomycin-polymyxin-dexameth (MAXITROL) 0.1 % OINT Apply a small amount into affected eye 2 times a day 3.5 g 2   propranolol ER (INDERAL LA) 60 MG 24 hr capsule Take 1 capsule (60 mg total) by mouth daily. 90 capsule 3   rosuvastatin (CRESTOR) 10 MG tablet Take 1 tablet (10 mg total) by mouth daily. 90 tablet 3   salmeterol (SEREVENT DISKUS) 50 MCG/ACT diskus inhaler Inhale 1 puff into the lungs 2 times daily. 180 each 1   triamcinolone cream (KENALOG) 0.1 % Apply to back twice daily as needed for itch 454 g 1   valACYclovir (VALTREX) 500 MG tablet Take 500 mg by mouth as needed.     furosemide (LASIX) 20 MG tablet Take 1 tablet (20 mg total) by mouth every other day.     potassium chloride (KLOR-CON M) 10 MEQ tablet Take 1 tablet (10 mEq total) by mouth every other day.     No current facility-administered medications for this encounter.   Wt Readings from Last 3 Encounters:  03/12/23 101.8 kg (224 lb 6.4 oz)  10/26/22 96.3 kg (212 lb 6.4 oz)  10/02/22 94.3 kg (208 lb)   BP (!) 140/80   Pulse 66   Wt 101.8 kg (224 lb 6.4 oz)   SpO2 96%   BMI 35.15 kg/m  General: NAD Neck: No JVD, no thyromegaly or thyroid nodule.   Lungs: Clear to auscultation bilaterally with normal respiratory effort. CV: Nondisplaced PMI.  Heart regular S1/S2, no S3/S4, no murmur.  No peripheral edema.  No carotid bruit.  Normal pedal pulses.  Abdomen: Soft, nontender, no hepatosplenomegaly, no  distention.  Skin: Intact without lesions or rashes.  Neurologic: Alert and oriented x 3.  Psych: Normal affect. Extremities: No clubbing or cyanosis.  HEENT: Normal.   Assessment/Plan: 1. Chronic diastolic CHF: Initial concern for pulmonary hypertension based on CT chest showing enlarged pulmonary artery.  Echo in 11/23 showed EF 60-65%, RV normal, unable to estimate PA systolic pressure. She does carry a diagnosis of COPD and was a prior smoker. RHC (2/24) showed mildly elevated PCWP and mild pulmonary venous hypertension. Suspect diastolic heart failure as findings not consistent with pulmonary arterial hypertension. NYHA II symptoms, she is not volume overloaded by exam today.  Weight gain more likely caloric-related than volume overload. - She can decrease Lasix and KCl to every other day and start Farxiga 10 mg daily.  - Check BMET and BNP today and in 10 days.  2. CAD: Patient has known coronary calcification by prior CT chests.  She had a Cardiolite in 2020 with no ischemia.  She has no chest pain. - Continue Crestor 10 mg daily, good LDL in 4/24.   3. HTN: BP borderline elevated, follow closely.  May need to add med in future.  4. Overweight: Body mass index is 35.15 kg/m. - Needs to work on diet and exercise, probably could not get GLP-1 agonist at this time due to lack of diabetes.   Follow up in 6 months with APP.   Marca Ancona  03/14/2023

## 2023-03-23 ENCOUNTER — Other Ambulatory Visit (HOSPITAL_COMMUNITY): Payer: PPO

## 2023-03-24 ENCOUNTER — Ambulatory Visit (HOSPITAL_COMMUNITY)
Admission: RE | Admit: 2023-03-24 | Discharge: 2023-03-24 | Disposition: A | Discharge status: Home / Self Care | Payer: PPO | Source: Ambulatory Visit | Attending: Cardiology | Admitting: Cardiology

## 2023-03-24 DIAGNOSIS — I2721 Secondary pulmonary arterial hypertension: Secondary | ICD-10-CM | POA: Insufficient documentation

## 2023-03-24 LAB — BASIC METABOLIC PANEL
Anion gap: 9 (ref 5–15)
BUN: 18 mg/dL (ref 8–23)
CO2: 28 mmol/L (ref 22–32)
Calcium: 9.2 mg/dL (ref 8.9–10.3)
Chloride: 99 mmol/L (ref 98–111)
Creatinine, Ser: 0.87 mg/dL (ref 0.44–1.00)
GFR, Estimated: >60 mL/min (ref >60)
Glucose, Bld: 100 mg/dL — ABNORMAL HIGH (ref 70–99)
Potassium: 4.3 mmol/L (ref 3.5–5.1)
Sodium: 136 mmol/L (ref 135–145)

## 2023-04-26 DIAGNOSIS — R5383 Other fatigue: Secondary | ICD-10-CM | POA: Diagnosis not present

## 2023-04-26 DIAGNOSIS — E039 Hypothyroidism, unspecified: Secondary | ICD-10-CM | POA: Diagnosis not present

## 2023-04-26 DIAGNOSIS — E559 Vitamin D deficiency, unspecified: Secondary | ICD-10-CM | POA: Diagnosis not present

## 2023-05-11 ENCOUNTER — Other Ambulatory Visit (HOSPITAL_COMMUNITY): Payer: Self-pay

## 2023-05-11 ENCOUNTER — Other Ambulatory Visit: Payer: Self-pay | Admitting: Internal Medicine

## 2023-05-12 ENCOUNTER — Other Ambulatory Visit (HOSPITAL_COMMUNITY): Payer: Self-pay

## 2023-05-12 MED ORDER — PROPRANOLOL HCL ER 60 MG PO CP24
60.0000 mg | ORAL_CAPSULE | Freq: Every day | ORAL | 0 refills | Status: DC
  Filled 2023-05-12: qty 90, 90d supply, fill #0

## 2023-05-13 ENCOUNTER — Other Ambulatory Visit (HOSPITAL_COMMUNITY): Payer: Self-pay

## 2023-05-17 ENCOUNTER — Ambulatory Visit (HOSPITAL_COMMUNITY)
Admission: RE | Admit: 2023-05-17 | Discharge: 2023-05-17 | Disposition: A | Discharge status: Home / Self Care | Payer: PPO | Source: Ambulatory Visit | Attending: Internal Medicine | Admitting: Internal Medicine

## 2023-05-17 DIAGNOSIS — Z122 Encounter for screening for malignant neoplasm of respiratory organs: Secondary | ICD-10-CM | POA: Insufficient documentation

## 2023-05-17 DIAGNOSIS — Z87891 Personal history of nicotine dependence: Secondary | ICD-10-CM | POA: Diagnosis not present

## 2023-05-19 DIAGNOSIS — L820 Inflamed seborrheic keratosis: Secondary | ICD-10-CM | POA: Diagnosis not present

## 2023-05-19 DIAGNOSIS — L2989 Other pruritus: Secondary | ICD-10-CM | POA: Diagnosis not present

## 2023-05-19 DIAGNOSIS — D492 Neoplasm of unspecified behavior of bone, soft tissue, and skin: Secondary | ICD-10-CM | POA: Diagnosis not present

## 2023-05-21 DIAGNOSIS — Z1231 Encounter for screening mammogram for malignant neoplasm of breast: Secondary | ICD-10-CM | POA: Diagnosis not present

## 2023-05-31 ENCOUNTER — Other Ambulatory Visit (HOSPITAL_COMMUNITY): Payer: Self-pay

## 2023-05-31 ENCOUNTER — Telehealth: Payer: Self-pay | Admitting: Acute Care

## 2023-05-31 ENCOUNTER — Other Ambulatory Visit: Payer: Self-pay | Admitting: Internal Medicine

## 2023-05-31 ENCOUNTER — Telehealth: Payer: Self-pay | Admitting: Internal Medicine

## 2023-05-31 ENCOUNTER — Other Ambulatory Visit (HOSPITAL_COMMUNITY): Payer: Self-pay | Admitting: Cardiology

## 2023-05-31 ENCOUNTER — Other Ambulatory Visit: Payer: Self-pay

## 2023-05-31 MED ORDER — VALACYCLOVIR HCL 500 MG PO TABS
500.0000 mg | ORAL_TABLET | ORAL | 0 refills | Status: AC | PRN
  Filled 2023-05-31 – 2023-06-01 (×3): qty 90, 90d supply, fill #0

## 2023-05-31 MED ORDER — FUROSEMIDE 20 MG PO TABS
20.0000 mg | ORAL_TABLET | ORAL | 3 refills | Status: DC
  Filled 2023-05-31: qty 30, 60d supply, fill #0
  Filled 2023-08-02: qty 30, 60d supply, fill #1
  Filled 2023-10-03: qty 30, 60d supply, fill #2
  Filled 2023-11-29: qty 30, 60d supply, fill #3

## 2023-05-31 NOTE — Telephone Encounter (Signed)
Contacted Annette [name] to schedule their annual wellness visit. Patient declined to schedule AWV at this time.Transferred care.  [facility] Care Guide [facility] AWV TEAM Direct Dial: [phone]

## 2023-05-31 NOTE — Telephone Encounter (Signed)
Patient states needs refill for Servent Diskus inhaler. Patient not due for appointment until February 2025. Pharmacy is [facility]. Patient phone number is [phone].

## 2023-05-31 NOTE — Telephone Encounter (Signed)
Called patient and notified she needs to contact her pharmacy. She should still have 1 refill left. Order in July 2024 #180 with 1 refill.

## 2023-06-01 ENCOUNTER — Other Ambulatory Visit (HOSPITAL_COMMUNITY): Payer: Self-pay

## 2023-06-01 ENCOUNTER — Other Ambulatory Visit: Payer: Self-pay

## 2023-06-01 DIAGNOSIS — Z23 Encounter for immunization: Secondary | ICD-10-CM | POA: Diagnosis not present

## 2023-06-02 ENCOUNTER — Other Ambulatory Visit (HOSPITAL_COMMUNITY): Payer: Self-pay

## 2023-06-02 ENCOUNTER — Other Ambulatory Visit: Payer: Self-pay

## 2023-06-04 ENCOUNTER — Other Ambulatory Visit: Payer: Self-pay

## 2023-06-04 DIAGNOSIS — Z122 Encounter for screening for malignant neoplasm of respiratory organs: Secondary | ICD-10-CM

## 2023-06-04 DIAGNOSIS — Z87891 Personal history of nicotine dependence: Secondary | ICD-10-CM

## 2023-06-05 ENCOUNTER — Other Ambulatory Visit (HOSPITAL_COMMUNITY): Payer: Self-pay

## 2023-06-28 ENCOUNTER — Other Ambulatory Visit (HOSPITAL_COMMUNITY): Payer: Self-pay

## 2023-07-06 ENCOUNTER — Other Ambulatory Visit: Payer: Self-pay

## 2023-07-07 ENCOUNTER — Other Ambulatory Visit (HOSPITAL_COMMUNITY): Payer: Self-pay

## 2023-08-02 ENCOUNTER — Other Ambulatory Visit (HOSPITAL_COMMUNITY): Payer: Self-pay

## 2023-08-05 ENCOUNTER — Other Ambulatory Visit (HOSPITAL_COMMUNITY): Payer: Self-pay

## 2023-08-15 ENCOUNTER — Other Ambulatory Visit: Payer: Self-pay | Admitting: Internal Medicine

## 2023-08-16 ENCOUNTER — Other Ambulatory Visit (HOSPITAL_COMMUNITY): Payer: Self-pay

## 2023-08-16 ENCOUNTER — Other Ambulatory Visit (HOSPITAL_COMMUNITY): Payer: Self-pay | Admitting: Cardiology

## 2023-08-16 MED ORDER — POTASSIUM CHLORIDE CRYS ER 10 MEQ PO TBCR
10.0000 meq | EXTENDED_RELEASE_TABLET | Freq: Every day | ORAL | 3 refills | Status: DC
  Filled 2023-08-16: qty 90, 90d supply, fill #0

## 2023-08-16 MED ORDER — PROPRANOLOL HCL ER 60 MG PO CP24
60.0000 mg | ORAL_CAPSULE | Freq: Every day | ORAL | 0 refills | Status: DC
  Filled 2023-08-16: qty 90, 90d supply, fill #0

## 2023-08-17 ENCOUNTER — Other Ambulatory Visit (HOSPITAL_COMMUNITY): Payer: Self-pay

## 2023-09-03 ENCOUNTER — Other Ambulatory Visit: Payer: Self-pay

## 2023-09-03 ENCOUNTER — Other Ambulatory Visit (HOSPITAL_COMMUNITY): Payer: Self-pay

## 2023-09-06 ENCOUNTER — Other Ambulatory Visit (HOSPITAL_COMMUNITY): Payer: Self-pay

## 2023-09-15 ENCOUNTER — Encounter (HOSPITAL_COMMUNITY): Payer: Medicare Other | Admitting: Cardiology

## 2023-09-21 ENCOUNTER — Ambulatory Visit (INDEPENDENT_AMBULATORY_CARE_PROVIDER_SITE_OTHER): Payer: Medicare Other | Admitting: Family Medicine

## 2023-09-21 ENCOUNTER — Encounter: Payer: Self-pay | Admitting: Family Medicine

## 2023-09-21 ENCOUNTER — Other Ambulatory Visit (HOSPITAL_COMMUNITY): Payer: Self-pay

## 2023-09-21 VITALS — BP 126/71 | HR 61 | Temp 97.9°F | Resp 12 | Ht 67.0 in | Wt 227.9 lb

## 2023-09-21 DIAGNOSIS — E669 Obesity, unspecified: Secondary | ICD-10-CM | POA: Insufficient documentation

## 2023-09-21 DIAGNOSIS — Z6835 Body mass index (BMI) 35.0-35.9, adult: Secondary | ICD-10-CM

## 2023-09-21 DIAGNOSIS — I2510 Atherosclerotic heart disease of native coronary artery without angina pectoris: Secondary | ICD-10-CM | POA: Diagnosis not present

## 2023-09-21 DIAGNOSIS — R7303 Prediabetes: Secondary | ICD-10-CM

## 2023-09-21 LAB — POCT GLYCOSYLATED HEMOGLOBIN (HGB A1C): Hemoglobin A1C: 5.7 % — AB (ref 4.0–5.6)

## 2023-09-21 MED ORDER — WEGOVY 0.25 MG/0.5ML ~~LOC~~ SOAJ
0.2500 mg | SUBCUTANEOUS | 0 refills | Status: DC
  Filled 2023-09-21: qty 2, 28d supply, fill #0

## 2023-09-21 NOTE — Patient Instructions (Addendum)
Basal energy expenditure -- 1600 calories per day  Total carbs per day: less than 100 grams of carbs per day  Protein: OVER 100 grams per day  [email or web address]  [email or web address].[email or web address]  [facility]  [facility]

## 2023-09-21 NOTE — Assessment & Plan Note (Signed)
I have had an extensive 30 minute conversation today with the patient about healthy eating habits, exercise, calorie and carb goals for sustainable and successful weight loss. I gave the patient caloric and protein daily intake values as well as described the importance of increasing fiber and water intake. I discussed weight loss medications that could be used in the treatment of this patient. Handouts on low carb eating were given to the patient.    Basal energy expenditure -- 1600 calories per day  Total carbs per day: less than 100 grams of carbs per day  Protein: OVER 100 grams per day  Patient has muliple co morbid conditions associated with obesity including prediabetes, HTN, CAD, etc.

## 2023-09-21 NOTE — Progress Notes (Signed)
 Established Patient Office Visit  Subjective   Patient ID: Annette [name], female    DOB: 09-04-1950  Age: 73 y.o. MRN: [id number]  Chief Complaint  Patient presents with   Establish Care    Wants to talk about weight     Pt is here to establish care, was a patient of Dr. Rollene, last seen 1 year ago.   Patient states that she has a history of hypothyroid, HTN, HLD.   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 States that the diets were very restrictive and she could not sustain the diet plans. States that the weight she felt the happiest was around 160-170 pounds. States that she used to be very physically active, dancing and was on her feet a lot during the day. Pt states that now she has ben having a lot of chronic back pain and this prohibits her from exercising. Does have a treadmill at home but since she started on the farxiga  she has been having a lot of back pain.     Current Outpatient Medications  Medication Instructions   albuterol  (VENTOLIN  HFA) 108 (90 Base) MCG/ACT inhaler 2 puffs, Inhalation, Every 6 hours PRN   aspirin EC 81 mg, Daily   dapagliflozin  propanediol (FARXIGA ) 10 MG TABS tablet Take 1 tablet (10 mg) by mouth daily before breakfast.   fexofenadine  (ALLEGRA ) 180 mg, 2 times daily   furosemide  (LASIX ) 20 mg, Oral, Every other day   guaiFENesin  (MUCINEX ) 600 mg, As needed   levothyroxine  (SYNTHROID ) 125 mcg, Oral, Daily before breakfast   Magnesium 400 mg   Multiple Vitamins-Minerals (PRESERVISION AREDS 2) CAPS 1 capsule, 2 times daily   naproxen sodium (ALEVE) 220 mg, Daily PRN   neomycin -polymyxin-dexameth (MAXITROL ) 0.1 % OINT Apply a small amount into affected eye 2 times a day   potassium chloride  (KLOR-CON  M) 10 MEQ tablet 10 mEq, Oral, Every other day   propranolol  ER (INDERAL  LA) 60 mg, Oral, Daily, Needs appt for  further refills   rosuvastatin  (CRESTOR ) 10 mg, Oral, Daily   salmeterol (SEREVENT  DISKUS) 50 MCG/ACT diskus inhaler Inhale 1 puff into the lungs 2 times daily.   triamcinolone  cream (KENALOG ) 0.1 % Apply to back twice daily as needed for itch   valACYclovir  (VALTREX ) 500 mg, Oral, As needed   Vitamin D  2,000 Units, Daily   Wegovy  0.25 mg, Subcutaneous, Weekly    Patient Active Problem List   Diagnosis Date Noted   Obesity (BMI 35.0-39.9 without comorbidity) 09/21/2023   Pulmonary artery hypertension (HCC) 05/23/2022   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   Overweight 01/30/2022   Eustachian tube dysfunction, bilateral 01/30/2022   Vertigo 01/30/2022   Essential tremor 01/08/2022   Otitis externa 10/07/2021   Deviated septum 10/07/2021   Prediabetes 12/16/2020   Vitamin D  insufficiency 12/16/2020   Hypothyroidism 12/16/2020   Hypertensive retinopathy 12/16/2020   Hypertensive heart disease without congestive heart failure 12/16/2020   Allergic rhinitis 12/05/2019   COPD (chronic obstructive pulmonary disease) (HCC) 10/16/2019   Coronary artery disease involving native coronary artery of native heart without angina pectoris 12/13/2017   Hyperlipidemia due to dietary fat intake 12/13/2017   Internal hemorrhoids with prolapse and bleeding 12/21/2013      Review of Systems  All other systems reviewed and are negative.     Objective:     BP 126/71 (BP Location: Left  Arm, Cuff Size: Normal)   Pulse 61   Temp 97.9 F (36.6 C) (Oral)   Resp 12   Ht 5' 7 (1.702 m)   Wt 227 lb 14.4 oz (103.4 kg)   SpO2 96%   BMI 35.69 kg/m    Physical Exam Vitals reviewed.  Constitutional:      Appearance: Normal appearance. She is well-groomed. She is obese.  Eyes:     Conjunctiva/sclera: Conjunctivae normal.  Neck:     Thyroid : No thyromegaly.  Cardiovascular:     Rate and Rhythm: Normal rate and regular rhythm.     Pulses: Normal pulses.     Heart sounds: S1 normal and S2 normal.   Pulmonary:     Effort: Pulmonary effort is normal.     Breath sounds: Normal breath sounds and air entry.  Abdominal:     General: Bowel sounds are normal.  Musculoskeletal:     Right lower leg: No edema.     Left lower leg: No edema.  Neurological:     Mental Status: She is alert and oriented to person, place, and time. Mental status is at baseline.     Gait: Gait is intact.  Psychiatric:        Mood and Affect: Mood and affect normal.        Speech: Speech normal.        Behavior: Behavior normal.        Judgment: Judgment normal.      Results for orders placed or performed in visit on 09/21/23  POC HgB A1c  Result Value Ref Range   Hemoglobin A1C 5.7 (A) 4.0 - 5.6 %   HbA1c POC (<> result, manual entry)     HbA1c, POC (prediabetic range)     HbA1c, POC (controlled diabetic range)        The 10-year ASCVD risk score (Arnett DK, et al., 2019) is: 17.9%    Assessment & Plan:  Prediabetes -     POCT glycosylated hemoglobin (Hb A1C)  Coronary artery disease involving native coronary artery of native heart without angina pectoris Assessment & Plan: I have reviewed her CT calcium  score as well as the CT coronary from 2019. She has evidence of CAD on both tests, will order wegovy  0.25 mg weekly as it is FDA approved for the CAD indication.   Orders: -     Wegovy ; Inject 0.25 mg into the skin once a week.  Dispense: 2 mL; Refill: 0  Obesity (BMI 35.0-39.9 without comorbidity) Assessment & Plan: I have had an extensive 30 minute conversation today with the patient about healthy eating habits, exercise, calorie and carb goals for sustainable and successful weight loss. I gave the patient caloric and protein daily intake values as well as described the importance of increasing fiber and water intake. I discussed weight loss medications that could be used in the treatment of this patient. Handouts on low carb eating were given to the patient.    Basal energy expenditure --  1600 calories per day  Total carbs per day: less than 100 grams of carbs per day  Protein: OVER 100 grams per day  Patient has muliple co morbid conditions associated with obesity including prediabetes, HTN, CAD, etc.       Return in about 6 months (around 03/20/2024).    Heron CHRISTELLA Sharper, MD

## 2023-09-21 NOTE — Assessment & Plan Note (Signed)
I have reviewed her CT calcium score as well as the CT coronary from 2019. She has evidence of CAD on both tests, will order wegovy 0.25 mg weekly as it is FDA approved for the CAD indication.

## 2023-09-24 ENCOUNTER — Other Ambulatory Visit (HOSPITAL_COMMUNITY): Payer: Self-pay

## 2023-09-24 ENCOUNTER — Telehealth: Payer: Self-pay

## 2023-09-24 NOTE — Telephone Encounter (Signed)
 Copied from CRM [id number]. Topic: Clinical - Prescription Issue >> Sep 24, 2023  9:57 AM Curlee DEL wrote: Reason for CRM: Patient is calling to inform Dr. Ozell that the Semaglutide -Weight Management (WEGOVY ) 0.25 MG/0.5ML SOAJ  requires prior authorization - the insurance company is going to send over a form - Can the patient be notified of the outcome via phone when you have a moment.

## 2023-09-27 ENCOUNTER — Telehealth: Payer: Self-pay

## 2023-09-27 NOTE — Telephone Encounter (Signed)
 Pharmacy Patient Advocate Encounter   Received notification from Pt Calls Messages that prior authorization for Wegovy  0.25MG /0.5ML auto-injectors is required/requested.   Insurance verification completed.   The patient is insured through [facility] .   Per test claim: PA required; PA submitted to above mentioned insurance via CoverMyMeds Key/confirmation #/EOC [facility] Status is pending

## 2023-09-27 NOTE — Telephone Encounter (Signed)
 PA request has been Submitted. New Encounter created for follow up. For additional info see Pharmacy Prior Auth telephone encounter from 09/27/23.

## 2023-09-28 ENCOUNTER — Other Ambulatory Visit (HOSPITAL_COMMUNITY): Payer: Self-pay

## 2023-09-28 NOTE — Telephone Encounter (Signed)
Pharmacy Patient Advocate Encounter  Received notification from [employer] that Prior Authorization for [facility] 0.25MG /0.5ML auto-injectors has been DENIED.  See denial reason below. No denial letter attached in CMM. Will attach denial letter to Media tab once received.   PA #/Case ID/Reference #:  [id number]

## 2023-09-30 ENCOUNTER — Encounter: Payer: Self-pay | Admitting: Emergency Medicine

## 2023-09-30 ENCOUNTER — Other Ambulatory Visit: Payer: Self-pay

## 2023-09-30 ENCOUNTER — Ambulatory Visit: Payer: Medicare Other | Admitting: Emergency Medicine

## 2023-09-30 VITALS — BP 118/70 | HR 70 | Ht 67.0 in | Wt 229.6 lb

## 2023-09-30 DIAGNOSIS — Z87891 Personal history of nicotine dependence: Secondary | ICD-10-CM

## 2023-09-30 DIAGNOSIS — L570 Actinic keratosis: Secondary | ICD-10-CM | POA: Diagnosis not present

## 2023-09-30 DIAGNOSIS — L820 Inflamed seborrheic keratosis: Secondary | ICD-10-CM | POA: Diagnosis not present

## 2023-09-30 DIAGNOSIS — J449 Chronic obstructive pulmonary disease, unspecified: Secondary | ICD-10-CM | POA: Diagnosis not present

## 2023-09-30 NOTE — Addendum Note (Signed)
Addended by: Girtha Hake on: 09/30/2023 04:25 PM   Modules accepted: Orders

## 2023-09-30 NOTE — Progress Notes (Signed)
Subjective:    Patient ID: Annette [name], female    DOB: [birth year], 73 y.o.   MRN: [id number]  HPI  ROV 09/30/2023 --73 year old woman with a history of tobacco use (40 pack years) and COPD, obstruction and restriction on her pulmonary function testing.  Also with CAD, hypertension, hypothyroidism. Today she reports that she has been having back pain since starting farxiga. She is also having a lot of rhinitis since she started it. On furosemide.  She had a URI a few weeks ago - developed cough, some SOB. Used mucinex, did not need abx or pred.  She remains on serevent, tolerates well. Uses albuterol rarely - once during the URI.   Right heart catheterization 10/02/2022 showed a mildly elevated PA OP with some mild pulmonary venous hypertension consistent with diastolic CHF  Lung cancer screening CT chest 05/17/2023 reviewed by me shows no mediastinal adenopathy, mild centrilobular emphysema with diffuse bronchial wall thickening, no masses.  Stable pulmonary nodules without any interval change largest 4 mm in the posterior left lower lobe.  RADS 2 study   Review of Systems  Constitutional:  Negative for fever and unexpected weight change.  HENT:  Positive for ear pain, sinus pressure and sneezing. Negative for congestion, dental problem, nosebleeds, postnasal drip, rhinorrhea, sore throat and trouble swallowing.   Eyes:  Positive for itching. Negative for redness.  Respiratory:  Positive for cough, chest tightness, shortness of breath and wheezing.   Cardiovascular:  Positive for leg swelling. Negative for palpitations.  Gastrointestinal:  Negative for nausea and vomiting.  Genitourinary:  Positive for dysuria.  Musculoskeletal:  Negative for joint swelling.  Skin:  Negative for rash.  Allergic/Immunologic: Negative.  Negative for environmental allergies, food allergies and immunocompromised state.  Neurological:  Positive for headaches.  Hematological:  Does not bruise/bleed easily.   Psychiatric/Behavioral:  Negative for dysphoric mood. The patient is not nervous/anxious.     Past Medical History:  Diagnosis Date   Arthritis    Right hip, end stage   Back pain    CHF (congestive heart failure) (HCC)    COPD (chronic obstructive pulmonary disease) (HCC)    Coronary artery calcification seen on computed tomography 02/2018   Coronary calcium score 79.  Coronary CT angiogram: Mild CAD and proximal LAD, proximal RCA and mid LCx.  Moderate plaque ostial LCx.  Mildly dilated pulmonary artery, suggestive of possible pulmonary hypertension   Difficulty sleeping    DUE TO PAIN   Edema of both lower extremities    Emphysema of lung (HCC)    Fluid retention    TAKES HCTZ   GERD (gastroesophageal reflux disease)    Hemorrhoids    Hyperlipidemia    Hypertension    Hypothyroidism    IBS (irritable bowel syndrome)    Irritable bowel syndrome    Joint pain    Macular degeneration    Osteopenia    Shingles 2004   SOB (shortness of breath)    Tremors of nervous system    TAKES PROPRANOLOL TO TX   Varicose veins    Vitamin D deficiency      Family History  Problem Relation Age of Onset   Alzheimer's disease Father        developed CAD late in life   High blood pressure Father    High Cholesterol Father    Heart disease Father    AAA (abdominal aortic aneurysm) Father    Stroke Mother        died @ ~  53 y/o   CAD Mother 42       s/p CABG   High blood pressure Mother    High Cholesterol Mother    Heart disease Mother    Thyroid disease Mother    Anxiety disorder Mother    Alcoholism Mother    Breast cancer Sister      Social History   Socioeconomic History   Marital status: Divorced    Spouse name: Not on file   Number of children: 1   Years of education: Not on file   Highest education level: Some college, no degree  Occupational History   Occupation: retired Charity fundraiser    Comment: RN endoscopyt  Tobacco Use   Smoking status: Former    Current packs/day:  0.00    Average packs/day: 1 pack/day for 38.0 years (38.0 ttl pk-yrs)    Types: Cigarettes    Start date: 03/15/1971    Quit date: 03/14/2009    Years since quitting: 14.5   Smokeless tobacco: Never  Substance and Sexual Activity   Alcohol use: Yes    Comment: beer every 6-8 months   Drug use: No   Sexual activity: Not on file  Other Topics Concern   Not on file  Social History Narrative   [employer] [facility]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 1 son   1 caffeine drink daily   Updated 06/01/2013   Social Drivers of Health   Financial Resource Strain: Low Risk  (09/17/2023)   Overall Financial Resource Strain (CARDIA)    Difficulty of Paying Living Expenses: Not hard at all  Food Insecurity: No Food Insecurity (09/17/2023)   Hunger Vital Sign    Worried About Running Out of Food in the Last Year: Never true    Ran Out of Food in the Last Year: Never true  Transportation Needs: No Transportation Needs (09/17/2023)   PRAPARE - Administrator, Civil Service (Medical): No    Lack of Transportation (Non-Medical): No  Physical Activity: Insufficiently Active (09/17/2023)   Exercise Vital Sign    Days of Exercise per Week: 1 day    Minutes of Exercise per Session: 20 min  Stress: Patient Declined (09/17/2023)   [employer] of Occupational Health - Occupational Stress Questionnaire    Feeling of Stress : Patient declined  Social Connections: Unknown (09/17/2023)   Social Connection and Isolation Panel [NHANES]    Frequency of Communication with Friends and Family: More than three times a week    Frequency of Social Gatherings with Friends and Family: Once a week    Attends Religious Services: Patient declined    Database administrator or Organizations: No    Attends Engineer, structural: Not on file    Marital Status: Divorced  Catering manager Violence: Not on file     Allergies  Allergen Reactions   Augmentin [Amoxicillin-Pot Clavulanate] Nausea And Vomiting   Codeine  Other (See Comments)    Causes her to stay awake    Levofloxacin Other (See Comments)    Severe joint and muscle pain   Other Hives and Other (See Comments)    IV contrast    Iodinated Contrast Media Rash     Outpatient Medications Prior to Visit  Medication Sig Dispense Refill   albuterol (VENTOLIN HFA) 108 (90 Base) MCG/ACT inhaler Inhale 2 puffs into the lungs every 6 (six) hours as needed for wheezing or shortness of breath. 6.7 g 5   aspirin EC 81 MG tablet  Take 81 mg by mouth daily.     Cholecalciferol (VITAMIN D) 50 MCG (2000 UT) tablet Take 2,000 Units by mouth daily.     dapagliflozin propanediol (FARXIGA) 10 MG TABS tablet Take 1 tablet (10 mg) by mouth daily before breakfast. 90 tablet 3   fexofenadine (ALLEGRA) 180 MG tablet Take 180 mg by mouth 2 (two) times daily.     furosemide (LASIX) 20 MG tablet Take 1 tablet (20 mg total) by mouth every other day. 30 tablet 3   guaiFENesin (MUCINEX) 600 MG 12 hr tablet Take 600 mg by mouth as needed.     levothyroxine (SYNTHROID) 125 MCG tablet Take 1 tablet (125 mcg total) by mouth daily before breakfast. 90 tablet 3   Magnesium 400 MG TABS Take 400 mg by mouth.     Multiple Vitamins-Minerals (PRESERVISION AREDS 2) CAPS Take 1 capsule by mouth 2 (two) times daily.     naproxen sodium (ALEVE) 220 MG tablet Take 220 mg by mouth daily as needed (pain).     neomycin-polymyxin-dexameth (MAXITROL) 0.1 % OINT Apply a small amount into affected eye 2 times a day 3.5 g 2   potassium chloride (KLOR-CON M) 10 MEQ tablet Take 1 tablet (10 mEq total) by mouth every other day.     propranolol ER (INDERAL LA) 60 MG 24 hr capsule Take 1 capsule (60 mg total) by mouth daily. Needs appt for further refills 90 capsule 0   rosuvastatin (CRESTOR) 10 MG tablet Take 1 tablet (10 mg total) by mouth daily. 90 tablet 3   salmeterol (SEREVENT DISKUS) 50 MCG/ACT diskus inhaler Inhale 1 puff into the lungs 2 times daily. 180 each 1   Semaglutide-Weight Management  (WEGOVY) 0.25 MG/0.5ML SOAJ Inject 0.25 mg into the skin once a week. 2 mL 0   triamcinolone cream (KENALOG) 0.1 % Apply to back twice daily as needed for itch 454 g 1   valACYclovir (VALTREX) 500 MG tablet Take 1 tablet (500 mg total) by mouth as needed. 90 tablet 0   No facility-administered medications prior to visit.       Objective:   Physical Exam Vitals:   09/30/23 1429  BP: 118/70  Pulse: 70  SpO2: 95%  Weight: 229 lb 9.6 oz (104.1 kg)  Height: 5\' 7"  (1.702 m)   Gen: Pleasant, overwt woman, in no distress,  normal affect  ENT: No lesions,  mouth clear,  oropharynx clear, no postnasal drip  Neck: No JVD, no stridor  Lungs: No use of accessory muscles, no crackles or wheezing on normal respiration, soft end exp wheeze on a forced exp.  Cardiovascular: RRR, heart sounds normal, no murmur or gallops, no peripheral edema  Musculoskeletal: No deformities, no cyanosis or clubbing  Neuro: alert, awake, non focal  Skin: Warm, no lesions or rash      Assessment & Plan:  COPD (chronic obstructive pulmonary disease) (HCC) Please continue Serevent twice a day you have been taking it Keep your albuterol available to use 2 puffs when needed for shortness of breath, chest tightness, wheezing. Use your Mucinex as needed for congestion We will give you prescription for a flutter valve  History of tobacco use We reviewed your screening CT scan of the chest from September today.  Your next lung cancer screening CT scan will be in September 2025. Follow-up with Dr. Delton Coombes or S. Groce NP in September after your CT chest so we can review those results  Levy Pupa, MD, PhD 09/30/2023, 2:50 PM   Pulmonary and [facility] Care [phone] or if no answer [phone]

## 2023-09-30 NOTE — Assessment & Plan Note (Signed)
Please continue Serevent twice a day you have been taking it Keep your albuterol available to use 2 puffs when needed for shortness of breath, chest tightness, wheezing. Use your Mucinex as needed for congestion We will give you prescription for a flutter valve

## 2023-09-30 NOTE — Patient Instructions (Addendum)
Please continue Serevent twice a day you have been taking it Keep your albuterol available to use 2 puffs when needed for shortness of breath, chest tightness, wheezing. Use your Mucinex as needed for congestion We will give you prescription for a flutter valve We reviewed your screening CT scan of the chest from September today.  Your next lung cancer screening CT scan will be in September 2025. Follow-up with Dr. Delton Coombes or S. Groce NP in September after your CT chest so we can review those results

## 2023-09-30 NOTE — Assessment & Plan Note (Signed)
We reviewed your screening CT scan of the chest from September today.  Your next lung cancer screening CT scan will be in September 2025. Follow-up with Dr. Delton Coombes or S. Groce NP in September after your CT chest so we can review those results

## 2023-10-01 NOTE — Telephone Encounter (Signed)
I clearly documented that it was for her diagnosis of CAD-- using wegovy for CAD is FDA approved so I'm not sure what happened-- can we appeal this decision?

## 2023-10-02 ENCOUNTER — Other Ambulatory Visit (HOSPITAL_COMMUNITY): Payer: Self-pay

## 2023-10-03 ENCOUNTER — Encounter: Payer: Self-pay | Admitting: Family Medicine

## 2023-10-03 ENCOUNTER — Other Ambulatory Visit: Payer: Self-pay | Admitting: Acute Care

## 2023-10-04 ENCOUNTER — Telehealth: Payer: Self-pay | Admitting: Emergency Medicine

## 2023-10-04 ENCOUNTER — Other Ambulatory Visit: Payer: Self-pay

## 2023-10-04 ENCOUNTER — Telehealth: Payer: Self-pay | Admitting: Pharmacist

## 2023-10-04 ENCOUNTER — Other Ambulatory Visit (HOSPITAL_COMMUNITY): Payer: Self-pay

## 2023-10-04 NOTE — Telephone Encounter (Signed)
Patient states needs refill for Serevent Diskus. Pharmacy is [facility]. Patient phone number is [phone].

## 2023-10-04 NOTE — Telephone Encounter (Signed)
Appeal has been submitted for [facility]. Will advise when response is received, please be advised that most companies may take 30 days to make a decision. Appeal letter and supporting documentation have been faxed to [phone] on 10/04/2023 @12 :39pm.  Thank you, Dellie Burns, PharmD Clinical Pharmacist  [facility]  Direct Dial: [phone]

## 2023-10-04 NOTE — Telephone Encounter (Signed)
 Thank you :)

## 2023-10-05 ENCOUNTER — Other Ambulatory Visit (HOSPITAL_COMMUNITY): Payer: Self-pay

## 2023-10-05 MED ORDER — SEREVENT DISKUS 50 MCG/ACT IN AEPB
1.0000 | INHALATION_SPRAY | Freq: Two times a day (BID) | RESPIRATORY_TRACT | 1 refills | Status: DC
  Filled 2023-10-05: qty 180, 90d supply, fill #0
  Filled 2024-01-26: qty 180, 90d supply, fill #1

## 2023-10-05 NOTE — Telephone Encounter (Signed)
Additional information was requested by the insurance company.  The document was completed and faxed back to [phone] on 10/05/2023 @4 :37 pm.

## 2023-10-06 ENCOUNTER — Other Ambulatory Visit: Payer: Self-pay

## 2023-10-07 ENCOUNTER — Other Ambulatory Visit (HOSPITAL_COMMUNITY): Payer: Self-pay

## 2023-10-07 NOTE — Telephone Encounter (Signed)
Rx sent to pharmacy 2/18.  Spoke to patient, she stated that she received notification from pharmacy that Rx is ready for pickup. Nothing further needed.

## 2023-10-07 NOTE — Telephone Encounter (Signed)
PA request has been Submitted. New Encounter created for follow up. For additional info see Pharmacy Prior Auth telephone encounter from 10/04/2023.

## 2023-10-09 ENCOUNTER — Other Ambulatory Visit (HOSPITAL_COMMUNITY): Payer: Self-pay

## 2023-10-26 ENCOUNTER — Encounter: Payer: Self-pay | Admitting: Family Medicine

## 2023-10-26 ENCOUNTER — Telehealth: Admitting: Family Medicine

## 2023-10-26 ENCOUNTER — Other Ambulatory Visit (HOSPITAL_COMMUNITY): Payer: Self-pay

## 2023-10-26 ENCOUNTER — Ambulatory Visit: Payer: Self-pay | Admitting: Family Medicine

## 2023-10-26 VITALS — Temp 99.8°F

## 2023-10-26 DIAGNOSIS — U071 COVID-19: Secondary | ICD-10-CM | POA: Diagnosis not present

## 2023-10-26 MED ORDER — NIRMATRELVIR/RITONAVIR (PAXLOVID)TABLET
3.0000 | ORAL_TABLET | Freq: Two times a day (BID) | ORAL | 0 refills | Status: AC
  Filled 2023-10-26: qty 30, 5d supply, fill #0

## 2023-10-26 NOTE — Telephone Encounter (Signed)
 Appt was scheduled for today at 11:30am.

## 2023-10-26 NOTE — Progress Notes (Addendum)
 Virtual Medical Office Visit  Patient:  Annette [name]      Age: 73 y.o.       Sex:  female  Date:   10/26/2023  PCP:    Karie Georges, MD   Today's Healthcare Provider: Karie Georges, MD    Assessment/Plan:   Summary assessment:  Annette [name] was seen today for covid positive, headache and generalized body aches.  COVID-19 -     nirmatrelvir/ritonavir; Take 3 tablets by mouth 2 (two) times daily for 5 days as directed.  Dispense: 30 tablet; Refill: 0   Positive test at home will call in Paxlovid therapy. I have reviewed her GFR and her medications, script sent to pharmacy. Patient may also take OTC medication to help with her symptoms, she has albuterol as a rescue inhaler and mucinex at home already. RTC PRN, advised pt that if her sx worsen or persist she should go to UC for evaluation.  I spent 20 minutes discussing the patient's symptoms, reviewing medications and GFR in order to prescribe the paxlovid, as well as reviewed mask- wearing and isolation precautions with the patient.  No follow-ups on file.   She was advised to call the office or go to ER if her condition worsens    Subjective:   Annette [name] is a 73 y.o. female with PMH significant for: Past Medical History:  Diagnosis Date   Arthritis    Right hip, end stage   Back pain    CHF (congestive heart failure) (HCC)    COPD (chronic obstructive pulmonary disease) (HCC)    Coronary artery calcification seen on computed tomography 02/2018   Coronary calcium score 79.  Coronary CT angiogram: Mild CAD and proximal LAD, proximal RCA and mid LCx.  Moderate plaque ostial LCx.  Mildly dilated pulmonary artery, suggestive of possible pulmonary hypertension   Difficulty sleeping    DUE TO PAIN   Edema of both lower extremities    Emphysema of lung (HCC)    Fluid retention    TAKES HCTZ   GERD (gastroesophageal reflux disease)    Hemorrhoids    Hyperlipidemia    Hypertension    Hypothyroidism    IBS (irritable  bowel syndrome)    Irritable bowel syndrome    Joint pain    Macular degeneration    Osteopenia    Shingles 2004   SOB (shortness of breath)    Tremors of nervous system    TAKES PROPRANOLOL TO TX   Varicose veins    Vitamin D deficiency      Presenting today with: Chief Complaint  Patient presents with   Covid Positive    Patient states the home Covid test was positive this morning   Headache    Noted since last night, tried Tylenol with no relief   Generalized Body Aches    Noted since last night     She clarifies and reports that her condition: Pt reports she started last night, pt reports she didn't think she was around anyone who was sick. Started with body aches, headaches, sinus congestion and this was the same presentation that she had with her first COVID infection. States that she also is reporting ear pain.   She denies having any: Chest pain, no difficulty with breathing. Is coughing a lot however.          Objective/Observations  Physical Exam:  Polite and friendly Gen: NAD, congested and coughing over the phone, video would not come through  so I could not visualize the patient.   No images are attached to the encounter or orders placed in the encounter.    Results: No results found for any visits on 10/26/23.        Virtual Visit via Video   I connected with Annette [name] on 10/26/23 at 11:30 AM EDT by an audio enabled telemedicine application and verified that I am speaking with the correct person using two identifiers. The limitations of evaluation and management by telemedicine and the availability of in person appointments were discussed. The patient expressed understanding and agreed to proceed.   Percentage of appointment time on audio:  100% Patient location: Home Provider location: [facility] Persons participating in the virtual visit: Myself and Patient

## 2023-10-26 NOTE — Telephone Encounter (Signed)
 Copied from CRM [id number]. Topic: Clinical - Medication Refill >> Oct 26, 2023  8:06 AM Aletta Edouard wrote: Most Recent Primary Care Visit:  Provider: Karie Georges  Department: [facility]  Visit Type: OFFICE VISIT  Date: 09/21/2023  Medication: paxlovid  medication for Covid patient just tested positive for Covid   Has the patient contacted their pharmacy? No (Agent: If no, request that the patient contact the pharmacy for the refill. If patient does not wish to contact the pharmacy document the reason why and proceed with request.) (Agent: If yes, when and what did the pharmacy advise?)  Is this the correct pharmacy for this prescription? Yes If no, delete pharmacy and type the correct one.  This is the patient's preferred pharmacy:  [facility] [address] [place] [address] Phone: [phone] Fax: [phone]  Has the prescription been filled recently? No  Is the patient out of the medication? No  Has the patient been seen for an appointment in the last year OR does the patient have an upcoming appointment? Yes  Can we respond through MyChart? No  Agent: Please be advised that Rx refills may take up to 3 business days. We ask that you follow-up with your pharmacy.     Chief Complaint: Covid Symptoms: Headache, sore throat, nasty cough, sinuses stopped up, body aches, chills Frequency: Ongoing since yesterday Disposition: [] ED /[] Urgent Care (no appt availability in office) / [x] Appointment(In office/virtual)/ []  [facility]/ [] Home Care/ [] Refused Recommended Disposition /[] [facility]/ []  Follow-up with PCP Additional Notes: Patient stated she tested positive for Covid today. She started having symptoms yesterday. She has been taking Zyrtec and Tylenol to help with her symptoms, but she would like a prescription for Paxlovid. Appointment scheduled for this morning.   Reason for Disposition  MILD  difficulty breathing (e.g., minimal/no SOB at rest, SOB with walking, pulse <100)  Answer Assessment - Initial Assessment Questions 1. COVID-19 DIAGNOSIS: "How do you know that you have COVID?" (e.g., positive lab test or self-test, diagnosed by doctor or NP/PA, symptoms after exposure).     Positive home test  2. COVID-19 EXPOSURE: "Was there any known exposure to COVID before the symptoms began?" CDC Definition of close contact: within 6 feet (2 meters) for a total of 15 minutes or more over a 24-hour period.      Unknown  3. ONSET: "When did the COVID-19 symptoms start?"      Last night  4. WORST SYMPTOM: "What is your worst symptom?" (e.g., cough, fever, shortness of breath, muscle aches)     Headache, sore throat, nasty cough, sinuses stopped up  5. COUGH: "Do you have a cough?" If Yes, ask: "How bad is the cough?"       Patient stated she has a nasty cough and she coughed up yellow phlegm  6. FEVER: "Do you have a fever?" If Yes, ask: "What is your temperature, how was it measured, and when did it start?"     Feels feverish. Temp is 99  7. RESPIRATORY STATUS: "Describe your breathing?" (e.g., normal; shortness of breath, wheezing, unable to speak)      Little bit of SOB with activity  8. OTHER SYMPTOMS: "Do you have any other symptoms?"  (e.g., chills, fatigue, headache, loss of smell or taste, muscle pain, sore throat) Sore throat, body aches, headache 10/10, chills  Protocols used: Coronavirus (COVID-19) Diagnosed or Suspected-A-AH

## 2023-10-26 NOTE — Telephone Encounter (Signed)
 Noted- ok to close.

## 2023-11-04 ENCOUNTER — Telehealth (INDEPENDENT_AMBULATORY_CARE_PROVIDER_SITE_OTHER): Admitting: Internal Medicine

## 2023-11-04 ENCOUNTER — Encounter: Payer: Self-pay | Admitting: Internal Medicine

## 2023-11-04 ENCOUNTER — Other Ambulatory Visit (HOSPITAL_COMMUNITY): Payer: Self-pay

## 2023-11-04 DIAGNOSIS — U071 COVID-19: Secondary | ICD-10-CM

## 2023-11-04 DIAGNOSIS — J019 Acute sinusitis, unspecified: Secondary | ICD-10-CM

## 2023-11-04 MED ORDER — OXYMETAZOLINE HCL 0.05 % NA SOLN
NASAL | 0 refills | Status: DC
  Filled 2023-11-04: qty 30, 30d supply, fill #0

## 2023-11-04 MED ORDER — CEFDINIR 300 MG PO CAPS
300.0000 mg | ORAL_CAPSULE | Freq: Two times a day (BID) | ORAL | 0 refills | Status: DC
  Filled 2023-11-04: qty 14, 7d supply, fill #0

## 2023-11-04 NOTE — Progress Notes (Signed)
 Virtual Visit via Video Note  I connected with Annette [name] on 11/04/23 at 11:30 AM EDT by a video enabled telemedicine application and verified that I am speaking with the correct person using two identifiers. Location patient: home Location provider:work office Persons participating in the virtual visit: patient, provider   Patient aware  of the limitations of evaluation and management by telemedicine and  availability of in person appointments. and agreed to proceed.   HPI: Annette [name] presents for video visit  for days of sinus pressure pain and ear pain with  chills uncertain if fever Had covid 19 infection dx by test and  sx seen  3 11 and rx paxlovid   she was getting much better and now off for a few days and developed this scenario . Some cough but no dypsnea  or change from baseline.   Using neti pot and  still pressure pain and fullness  and left side nose  "wont open up"   Can take cephalosporins   gi se of augmentin  ROS: See pertinent positives and negatives per HPI.  Past Medical History:  Diagnosis Date   Arthritis    Right hip, end stage   Back pain    CHF (congestive heart failure) (HCC)    COPD (chronic obstructive pulmonary disease) (HCC)    Coronary artery calcification seen on computed tomography 02/2018   Coronary calcium score 79.  Coronary CT angiogram: Mild CAD and proximal LAD, proximal RCA and mid LCx.  Moderate plaque ostial LCx.  Mildly dilated pulmonary artery, suggestive of possible pulmonary hypertension   Difficulty sleeping    DUE TO PAIN   Edema of both lower extremities    Emphysema of lung (HCC)    Fluid retention    TAKES HCTZ   GERD (gastroesophageal reflux disease)    Hemorrhoids    Hyperlipidemia    Hypertension    Hypothyroidism    IBS (irritable bowel syndrome)    Irritable bowel syndrome    Joint pain    Macular degeneration    Osteopenia    Shingles 2004   SOB (shortness of breath)    Tremors of nervous system    TAKES  PROPRANOLOL TO TX   Varicose veins    Vitamin D deficiency     Past Surgical History:  Procedure Laterality Date   ABDOMINAL HYSTERECTOMY  2000   CATARACT EXTRACTION Right    CHOLECYSTECTOMY  1994   COLONOSCOPY     ERCP  2004   sludge in CBD (jaundiced)   ESOPHAGOGASTRODUODENOSCOPY     INCISE AND DRAIN ABCESS Left 05/2016   wrist   INCISION / DRAINAGE HAND / FINGER     LEFT INDEX FINGER   JOINT REPLACEMENT     Lexiscan Myoview  06/2019   Lexiscan Myoview 07/05/2019: LOW RISK.  EF 60 to 65%.  Fixed anteroseptal apical defect consistent with breast attenuation.   OVARIAN CYST REMOVAL  [id number]   RIGHT HEART CATH N/A 10/02/2022   Procedure: RIGHT HEART CATH;  Surgeon: Laurey Morale, MD;  Location: [facility] INVASIVE CV LAB;  Service: Cardiovascular;  Laterality: N/A;   ROTATOR CUFF REPAIR     left   TOTAL HIP ARTHROPLASTY  06/2010   right   TOTAL HIP ARTHROPLASTY Left 03/22/2015   Procedure: LEFT TOTAL HIP ARTHROPLASTY ANTERIOR APPROACH;  Surgeon: Ollen Gross, MD;  Location: [facility] ORS;  Service: Orthopedics;  Laterality: Left;   TRANSTHORACIC ECHOCARDIOGRAM  05/31/2019   EF 60-65%. Gr  1 DD (normal for Age). Normal valves & chamber sizes.     Family History  Problem Relation Age of Onset   Alzheimer's disease Father        developed CAD late in life   High blood pressure Father    High Cholesterol Father    Heart disease Father    AAA (abdominal aortic aneurysm) Father    Stroke Mother        died @ ~78 y/o   CAD Mother 34       s/p CABG   High blood pressure Mother    High Cholesterol Mother    Heart disease Mother    Thyroid disease Mother    Anxiety disorder Mother    Alcoholism Mother    Breast cancer Sister     Social History   Tobacco Use   Smoking status: Former    Current packs/day: 0.00    Average packs/day: 1 pack/day for 38.0 years (38.0 ttl pk-yrs)    Types: Cigarettes    Start date: 03/15/1971    Quit date: 03/14/2009    Years since quitting: 14.6    Smokeless tobacco: Never  Substance Use Topics   Alcohol use: Yes    Comment: beer every 6-8 months   Drug use: No      Current Outpatient Medications:    cefdinir (OMNICEF) 300 MG capsule, Take 1 capsule (300 mg total) by mouth 2 (two) times daily. For sinusitis, Disp: 14 capsule, Rfl: 0   oxymetazoline (AFRIN) 0.05 % nasal spray, 1 spray every 12 hours   into one or both nostrils for severe congestion and sinusitis . Limit use to 3 days in a row to prevent rebound congestion, Disp: 30 mL, Rfl: 0   albuterol (VENTOLIN HFA) 108 (90 Base) MCG/ACT inhaler, Inhale 2 puffs into the lungs every 6 (six) hours as needed for wheezing or shortness of breath., Disp: 6.7 g, Rfl: 5   aspirin EC 81 MG tablet, Take 81 mg by mouth daily., Disp: , Rfl:    Cholecalciferol (VITAMIN D) 50 MCG (2000 UT) tablet, Take 2,000 Units by mouth daily., Disp: , Rfl:    dapagliflozin propanediol (FARXIGA) 10 MG TABS tablet, Take 1 tablet (10 mg) by mouth daily before breakfast., Disp: 90 tablet, Rfl: 3   fexofenadine (ALLEGRA) 180 MG tablet, Take 180 mg by mouth 2 (two) times daily., Disp: , Rfl:    furosemide (LASIX) 20 MG tablet, Take 1 tablet (20 mg total) by mouth every other day., Disp: 30 tablet, Rfl: 3   guaiFENesin (MUCINEX) 600 MG 12 hr tablet, Take 600 mg by mouth as needed., Disp: , Rfl:    levothyroxine (SYNTHROID) 125 MCG tablet, Take 1 tablet (125 mcg total) by mouth daily before breakfast., Disp: 90 tablet, Rfl: 3   Magnesium 400 MG TABS, Take 400 mg by mouth., Disp: , Rfl:    Multiple Vitamins-Minerals (PRESERVISION AREDS 2) CAPS, Take 1 capsule by mouth 2 (two) times daily., Disp: , Rfl:    naproxen sodium (ALEVE) 220 MG tablet, Take 220 mg by mouth daily as needed (pain)., Disp: , Rfl:    neomycin-polymyxin-dexameth (MAXITROL) 0.1 % OINT, Apply a small amount into affected eye 2 times a day, Disp: 3.5 g, Rfl: 2   potassium chloride (KLOR-CON M) 10 MEQ tablet, Take 1 tablet (10 mEq total) by mouth every  other day., Disp: , Rfl:    propranolol ER (INDERAL LA) 60 MG 24 hr capsule, Take 1 capsule (60 mg  total) by mouth daily. Needs appt for further refills, Disp: 90 capsule, Rfl: 0   rosuvastatin (CRESTOR) 10 MG tablet, Take 1 tablet (10 mg total) by mouth daily., Disp: 90 tablet, Rfl: 3   salmeterol (SEREVENT DISKUS) 50 MCG/ACT diskus inhaler, Inhale 1 puff into the lungs 2 times daily., Disp: 180 each, Rfl: 1   Semaglutide-Weight Management (WEGOVY) 0.25 MG/0.5ML SOAJ, Inject 0.25 mg into the skin once a week., Disp: 2 mL, Rfl: 0   triamcinolone cream (KENALOG) 0.1 %, Apply to back twice daily as needed for itch, Disp: 454 g, Rfl: 1   valACYclovir (VALTREX) 500 MG tablet, Take 1 tablet (500 mg total) by mouth as needed., Disp: 90 tablet, Rfl: 0  EXAM: BP Readings from Last 3 Encounters:  09/30/23 118/70  09/21/23 126/71  03/12/23 (!) 140/80    VITALS per patient if applicable:  GENERAL: alert, oriented, appears well and in no acute distress non toxic very congested and no facial swelling or erythem   HEENT: atraumatic, conjunttiva clear, no obvious abnormalities on inspection of external nose and ears  NECK: normal movements of the head and neck  LUNGS: on inspection no signs of respiratory distress, breathing rate appears normal, no obvious gross SOB, gasping or wheezing  CV: no obvious cyanosis  MS: moves all visible extremities without noticeable abnormality  PSYCH/NEURO: pleasant and cooperative, no obvious depression or anxiety, speech and thought processing grossly intact Lab Results  Component Value Date   WBC 7.9 10/02/2022   HGB 12.9 10/02/2022   HGB 12.9 10/02/2022   HCT 38.0 10/02/2022   HCT 38.0 10/02/2022   PLT 329 10/02/2022   GLUCOSE 100 (H) 03/24/2023   CHOL 150 11/30/2022   TRIG 153 (H) 11/30/2022   HDL 52 11/30/2022   LDLCALC 67 11/30/2022   ALT 11 11/30/2022   AST 19 11/30/2022   NA 136 03/24/2023   K 4.3 03/24/2023   CL 99 03/24/2023   CREATININE  0.87 03/24/2023   BUN 18 03/24/2023   CO2 28 03/24/2023   TSH 0.65 09/15/2022   INR 1.01 03/15/2015   HGBA1C 5.7 (A) 09/21/2023    ASSESSMENT AND PLAN:   Discussed the following assessment and plan:    ICD-10-CM   1. Acute sinusitis, recurrence not specified, unspecified location  J01.90     2. COVID-19  U07.1    convalscing   see notes  visi 3 11     May have some rebound off paxlovid but scenario consistent with secondary sinusitis  will rx with antibiotic  and sinus  hygiene and fu if  persistent or progressive or not as expected  Counseled.   Expectant management and discussion of plan and treatment with opportunity to ask questions and all were answered. The patient agreed with the plan and demonstrated an understanding of the instructions.   Advised to call back or seek an in-person evaluation if worsening  or having  further concerns  in interim. Return if symptoms worsen or fail to improve as expected.    Berniece Andreas, MD

## 2023-11-05 ENCOUNTER — Telehealth: Payer: Self-pay | Admitting: Emergency Medicine

## 2023-11-05 DIAGNOSIS — J449 Chronic obstructive pulmonary disease, unspecified: Secondary | ICD-10-CM

## 2023-11-05 NOTE — Telephone Encounter (Signed)
 Spoke with Annette [name]. Recent flutter valve order was incorrect. I have informed Brailey that [employer] [facility] will be in contact with her for information about her flutter valve. Pt verbalized understanding. New flutter valve order has been placed. FYI Dr. Delton Coombes

## 2023-11-05 NOTE — Telephone Encounter (Signed)
 PT states on her last visit Dr. B spoke to her about a flutter valve. AVS States:  Procedures ordered today Flutter valve Complete as directed by your provider.  She has not heard back from anyone about this. Please contact her @ [phone]  She is sick and thinks it may help.

## 2023-11-07 ENCOUNTER — Other Ambulatory Visit: Payer: Self-pay | Admitting: Internal Medicine

## 2023-11-09 ENCOUNTER — Other Ambulatory Visit (HOSPITAL_COMMUNITY): Payer: Self-pay

## 2023-11-09 MED ORDER — LEVOTHYROXINE SODIUM 125 MCG PO TABS
125.0000 ug | ORAL_TABLET | Freq: Every day | ORAL | 3 refills | Status: AC
  Filled 2023-11-09: qty 90, 90d supply, fill #0
  Filled 2024-02-05: qty 90, 90d supply, fill #1
  Filled 2024-05-09: qty 90, 90d supply, fill #2
  Filled 2024-07-27: qty 90, 90d supply, fill #3

## 2023-11-09 MED ORDER — PROPRANOLOL HCL ER 60 MG PO CP24
60.0000 mg | ORAL_CAPSULE | Freq: Every day | ORAL | 0 refills | Status: DC
  Filled 2023-11-09: qty 90, 90d supply, fill #0

## 2023-11-10 ENCOUNTER — Other Ambulatory Visit (HOSPITAL_COMMUNITY): Payer: Self-pay

## 2023-11-11 ENCOUNTER — Encounter (HOSPITAL_COMMUNITY): Payer: Self-pay | Admitting: Cardiology

## 2023-11-11 ENCOUNTER — Ambulatory Visit (HOSPITAL_COMMUNITY)
Admission: RE | Admit: 2023-11-11 | Discharge: 2023-11-11 | Disposition: A | Discharge status: Home / Self Care | Payer: Medicare Other | Source: Ambulatory Visit | Attending: Cardiology | Admitting: Cardiology

## 2023-11-11 ENCOUNTER — Encounter (HOSPITAL_COMMUNITY): Payer: Self-pay

## 2023-11-11 VITALS — BP 130/80 | HR 55 | Wt 228.8 lb

## 2023-11-11 DIAGNOSIS — R7303 Prediabetes: Secondary | ICD-10-CM | POA: Diagnosis not present

## 2023-11-11 DIAGNOSIS — I2510 Atherosclerotic heart disease of native coronary artery without angina pectoris: Secondary | ICD-10-CM | POA: Diagnosis not present

## 2023-11-11 DIAGNOSIS — I272 Pulmonary hypertension, unspecified: Secondary | ICD-10-CM | POA: Diagnosis not present

## 2023-11-11 DIAGNOSIS — J449 Chronic obstructive pulmonary disease, unspecified: Secondary | ICD-10-CM | POA: Insufficient documentation

## 2023-11-11 DIAGNOSIS — I5032 Chronic diastolic (congestive) heart failure: Secondary | ICD-10-CM | POA: Diagnosis not present

## 2023-11-11 DIAGNOSIS — Z8616 Personal history of COVID-19: Secondary | ICD-10-CM | POA: Diagnosis not present

## 2023-11-11 DIAGNOSIS — Z6835 Body mass index (BMI) 35.0-35.9, adult: Secondary | ICD-10-CM | POA: Diagnosis not present

## 2023-11-11 DIAGNOSIS — Z79899 Other long term (current) drug therapy: Secondary | ICD-10-CM | POA: Insufficient documentation

## 2023-11-11 DIAGNOSIS — Z8249 Family history of ischemic heart disease and other diseases of the circulatory system: Secondary | ICD-10-CM | POA: Insufficient documentation

## 2023-11-11 DIAGNOSIS — E663 Overweight: Secondary | ICD-10-CM | POA: Diagnosis not present

## 2023-11-11 DIAGNOSIS — I110 Hypertensive heart disease with heart failure: Secondary | ICD-10-CM | POA: Diagnosis not present

## 2023-11-11 DIAGNOSIS — Z87891 Personal history of nicotine dependence: Secondary | ICD-10-CM | POA: Diagnosis not present

## 2023-11-11 LAB — BASIC METABOLIC PANEL WITH GFR
Anion gap: 12 (ref 5–15)
BUN: 16 mg/dL (ref 8–23)
CO2: 29 mmol/L (ref 22–32)
Calcium: 9.4 mg/dL (ref 8.9–10.3)
Chloride: 98 mmol/L (ref 98–111)
Creatinine, Ser: 0.87 mg/dL (ref 0.44–1.00)
GFR, Estimated: >60 mL/min (ref >60)
Glucose, Bld: 100 mg/dL — ABNORMAL HIGH (ref 70–99)
Potassium: 5.4 mmol/L — ABNORMAL HIGH (ref 3.5–5.1)
Sodium: 139 mmol/L (ref 135–145)

## 2023-11-11 LAB — BRAIN NATRIURETIC PEPTIDE: B Natriuretic Peptide: 132.1 pg/mL — ABNORMAL HIGH (ref 0.0–100.0)

## 2023-11-11 LAB — LIPID PANEL
Cholesterol: 157 mg/dL (ref 0–200)
HDL: 46 mg/dL (ref >40)
LDL Cholesterol: 78 mg/dL (ref 0–99)
Total CHOL/HDL Ratio: 3.4 ratio
Triglycerides: 165 mg/dL — ABNORMAL HIGH (ref <150)
VLDL: 33 mg/dL (ref 0–40)

## 2023-11-11 NOTE — Patient Instructions (Signed)
 There has been no changes to your medications.  Labs done today, your results will be available in MyChart, we will contact you for abnormal readings.  You have been referred to the HEART CARE PHARMACY TEAM.  They will call you to arrange your appointment.  Your physician recommends that you schedule a follow-up appointment in: 6 months.  If you have any questions or concerns before your next appointment please send [place] a message through [place] or call our office at [phone].    TO LEAVE A MESSAGE FOR THE NURSE SELECT OPTION 2, PLEASE LEAVE A MESSAGE INCLUDING: YOUR NAME DATE OF BIRTH CALL BACK NUMBER REASON FOR CALL**this is important as we prioritize the call backs  YOU WILL RECEIVE A CALL BACK THE SAME DAY AS LONG AS YOU CALL BEFORE 4:00 PM  At the [facility], you and your health needs are our priority. As part of our continuing mission to provide you with exceptional heart care, we have created designated Provider Care Teams. These Care Teams include your primary Cardiologist (physician) and Advanced Practice Providers (APPs- Physician Assistants and Nurse Practitioners) who all work together to provide you with the care you need, when you need it.   You may see any of the following providers on your designated Care Team at your next follow up: Dr Arvilla Meres Dr Marca Ancona Dr. Dorthula Nettles Dr. Clearnce Hasten Amy Filbert Schilder, NP Robbie Lis, [place] [facility], [place] Brynda Peon, NP [place] Lee, NP Clarisa Kindred, NP Karle Plumber, PharmD Enos Fling, PharmD   Please be sure to bring in all your medications bottles to every appointment.    Thank you for choosing [facility]

## 2023-11-12 ENCOUNTER — Telehealth: Payer: Self-pay

## 2023-11-12 ENCOUNTER — Other Ambulatory Visit (HOSPITAL_COMMUNITY): Payer: Self-pay

## 2023-11-12 ENCOUNTER — Other Ambulatory Visit (HOSPITAL_COMMUNITY): Payer: Self-pay | Admitting: Cardiology

## 2023-11-12 DIAGNOSIS — I5032 Chronic diastolic (congestive) heart failure: Secondary | ICD-10-CM

## 2023-11-12 NOTE — Telephone Encounter (Signed)
 Copied from CRM [id number]. Topic: Clinical - Prescription Issue >> Nov 11, 2023  9:46 AM Theodis Sato wrote: Reason for CRM: Patient states she has still not received any update regarding her flutter valve from [employer]. Patient states she has been waiting three weeks for this and is requesting a call from Dr. Neville Route nurse regarding this.  Spoke with [facility] regarding dme order placed 3/21. They informed me they received the order on 3/26 and it shows that its still being processed.  I also spoke with Lavern and informed pt of info from [employer] [facility]. She verbalized understanding.  FYI Dr. Delton Coombes

## 2023-11-12 NOTE — Telephone Encounter (Signed)
 Informed pt someone from [employer] [facility] will be reaching out to her. Nothing further needed.

## 2023-11-12 NOTE — Progress Notes (Signed)
Orders placed for upcoming lab appt  

## 2023-11-12 NOTE — Progress Notes (Signed)
 PCP: Karie Georges, MD Cardiology: Dr Shirlee Latch  Chief complaint: CHF  73 y.o. with history of HTN and COPD was referred by Dr. Okey Dupre for evaluation of pulmonary hypertension.  Patient was a prior smoker, quit in 2010.  She has CAD noted by coronary calcification on chest CT but Cardiolite in 11/20 was normal.  She had a chest CT for lung cancer screening in 9/23, this showed an enlarged pulmonary artery concerning for pulmonary hypertension. She had an echo done after this showing EF 60-65%, normal RV, but no TR jet so unable to estimate PA systolic pressure.    I first saw her in 09/21/22. With mild dyspnea and hx of COPD, there was concern for group 3 pulmonary HTN, and RHC was arranged.  RHC (2/24) showed mildly elevated PCWP and mild pulmonary venous hypertension. Suspect diastolic heart failure as findings not consistent with pulmonary arterial hypertension. HCTZ stopped and Lasix started.  She returns today for followup of diastolic CHF. She had COVID-19 recently and is recovering from it.  She is still fatigued.  Prior to COVID, she had no dyspnea walking on flat ground and mild dyspnea with hills and stairs.  No orthopnea/PND.  Rare atypical chest pain.  SBP runs 110s-120s at home.   ECG (personally reviewed): NSR, normal  Labs (10/23): LDL 78, K 4.3, creatinine 1.61 Labs (2/24): K 3.6, creatinine 0.77 Labs (3/24): BNP 86 Labs (4/24): LDL 67 Labs (8/24): K 4.3, creatinine 0.87 Labs (11/24): LDL 67  PMH: 1. HTN 2. Hyperlipidemia 3. COPD: Quit smoking in 2010.  4. CAD: Calcified coronaries noted on prior CT chest.  - Cardiolite (11/20): normal 5. Familial tremor 6. H/o THR 7. Diastolic Heart Failure: - Echo (11/23): EF 60-65%, RV normal, unable to estimate PA systolic pressure.  - RHC (2/24): RA mean 8, PA 41/16 ( mean 27), PCWP mean 17, CO/CI (Fick) 6.32/3.08, PVR 1.6 WU  Social History   Socioeconomic History   Marital status: Divorced    Spouse name: Not on file    Number of children: 1   Years of education: Not on file   Highest education level: Some college, no degree  Occupational History   Occupation: retired Charity fundraiser    Comment: RN endoscopyt  Tobacco Use   Smoking status: Former    Current packs/day: 0.00    Average packs/day: 1 pack/day for 38.0 years (38.0 ttl pk-yrs)    Types: Cigarettes    Start date: 03/15/1971    Quit date: 03/14/2009    Years since quitting: 14.6   Smokeless tobacco: Never  Substance and Sexual Activity   Alcohol use: Yes    Comment: beer every 6-8 months   Drug use: No   Sexual activity: Not on file  Other Topics Concern   Not on file  Social History Narrative   [employer] [facility]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 1 son   1 caffeine drink daily   Updated 06/01/2013   Social Drivers of Health   Financial Resource Strain: Low Risk  (09/17/2023)   Overall Financial Resource Strain (CARDIA)    Difficulty of Paying Living Expenses: Not hard at all  Food Insecurity: No Food Insecurity (09/17/2023)   Hunger Vital Sign    Worried About Running Out of Food in the Last Year: Never true    Ran Out of Food in the Last Year: Never true  Transportation Needs: No Transportation Needs (09/17/2023)   PRAPARE - Administrator, Civil Service (Medical): No  Lack of Transportation (Non-Medical): No  Physical Activity: Insufficiently Active (09/17/2023)   Exercise Vital Sign    Days of Exercise per Week: 1 day    Minutes of Exercise per Session: 20 min  Stress: Patient Declined (09/17/2023)   [employer] of Occupational Health - Occupational Stress Questionnaire    Feeling of Stress : Patient declined  Social Connections: Unknown (09/17/2023)   Social Connection and Isolation Panel [NHANES]    Frequency of Communication with Friends and Family: More than three times a week    Frequency of Social Gatherings with Friends and Family: Once a week    Attends Religious Services: Patient declined    Database administrator or  Organizations: No    Attends Engineer, structural: Not on file    Marital Status: Divorced  Catering manager Violence: Not on file   Family History  Problem Relation Age of Onset   Alzheimer's disease Father        developed CAD late in life   High blood pressure Father    High Cholesterol Father    Heart disease Father    AAA (abdominal aortic aneurysm) Father    Stroke Mother        died @ ~21 y/o   CAD Mother 48       s/p CABG   High blood pressure Mother    High Cholesterol Mother    Heart disease Mother    Thyroid disease Mother    Anxiety disorder Mother    Alcoholism Mother    Breast cancer Sister    ROS: All systems reviewed and negative except as per HPI.   Current Outpatient Medications  Medication Sig Dispense Refill   albuterol (VENTOLIN HFA) 108 (90 Base) MCG/ACT inhaler Inhale 2 puffs into the lungs every 6 (six) hours as needed for wheezing or shortness of breath. 6.7 g 5   aspirin EC 81 MG tablet Take 81 mg by mouth daily.     Cholecalciferol (VITAMIN D) 50 MCG (2000 UT) tablet Take 2,000 Units by mouth daily.     dapagliflozin propanediol (FARXIGA) 10 MG TABS tablet Take 1 tablet (10 mg) by mouth daily before breakfast. 90 tablet 3   fexofenadine (ALLEGRA) 180 MG tablet Take 180 mg by mouth 2 (two) times daily.     furosemide (LASIX) 20 MG tablet Take 1 tablet (20 mg total) by mouth every other day. 30 tablet 3   guaiFENesin (MUCINEX) 600 MG 12 hr tablet Take 600 mg by mouth as needed.     levothyroxine (SYNTHROID) 125 MCG tablet Take 1 tablet (125 mcg total) by mouth daily before breakfast. 90 tablet 3   Magnesium 400 MG TABS Take 400 mg by mouth.     Multiple Vitamins-Minerals (PRESERVISION AREDS 2) CAPS Take 1 capsule by mouth 2 (two) times daily.     naproxen sodium (ALEVE) 220 MG tablet Take 220 mg by mouth daily as needed (pain).     potassium chloride (KLOR-CON M) 10 MEQ tablet Take 1 tablet (10 mEq total) by mouth every other day.      propranolol ER (INDERAL LA) 60 MG 24 hr capsule Take 1 capsule (60 mg total) by mouth daily. Needs appt for further refills 90 capsule 0   rosuvastatin (CRESTOR) 10 MG tablet Take 1 tablet (10 mg total) by mouth daily. 90 tablet 3   salmeterol (SEREVENT DISKUS) 50 MCG/ACT diskus inhaler Inhale 1 puff into the lungs 2 times daily. 180 each 1  triamcinolone cream (KENALOG) 0.1 % Apply to back twice daily as needed for itch 454 g 1   valACYclovir (VALTREX) 500 MG tablet Take 1 tablet (500 mg total) by mouth as needed. 90 tablet 0   Semaglutide-Weight Management (WEGOVY) 0.25 MG/0.5ML SOAJ Inject 0.25 mg into the skin once a week. (Patient not taking: Reported on 11/11/2023) 2 mL 0   No current facility-administered medications for this encounter.   Wt Readings from Last 3 Encounters:  11/11/23 103.8 kg (228 lb 12.8 oz)  09/30/23 104.1 kg (229 lb 9.6 oz)  09/21/23 103.4 kg (227 lb 14.4 oz)   BP 130/80   Pulse (!) 55   Wt 103.8 kg (228 lb 12.8 oz)   SpO2 93%   BMI 35.84 kg/m  General: NAD Neck: No JVD, no thyromegaly or thyroid nodule.  Lungs: Mild crackles at bases.  CV: Nondisplaced PMI.  Heart regular S1/S2, no S3/S4, no murmur.  No peripheral edema.  No carotid bruit.  Normal pedal pulses.  Abdomen: Soft, nontender, no hepatosplenomegaly, no distention.  Skin: Intact without lesions or rashes.  Neurologic: Alert and oriented x 3.  Psych: Normal affect. Extremities: No clubbing or cyanosis.  HEENT: Normal.   Assessment/Plan: 1. Chronic diastolic CHF: Initial concern for pulmonary hypertension based on CT chest showing enlarged pulmonary artery.  Echo in 11/23 showed EF 60-65%, RV normal, unable to estimate PA systolic pressure. She does carry a diagnosis of COPD and was a prior smoker. RHC (2/24) showed mildly elevated PCWP and mild pulmonary venous hypertension. Suspect diastolic heart failure as findings not consistent with pulmonary arterial hypertension. NYHA II symptoms, she is not  volume overloaded by exam today.   - Continue Lasix 20 every other day.  BMET/BNP today.  - Continue Farxiga 10 mg daily.   2. CAD: Patient has known coronary calcification by prior CT chests.  She had a Cardiolite in 2020 with no ischemia.  She has no chest pain. - Continue Crestor 10 mg daily, check lipids today. 3. HTN: BP controlled.  4. Overweight: Body mass index is 35.84 kg/m. - Needs to work on diet and exercise, probably could not get GLP-1 agonist at this time due to lack of diabetes.   Follow up in 6 months with APP.   I spent 21 minutes reviewing records, interviewing/examining patient, and managing orders.     Marca Ancona  11/12/2023

## 2023-11-16 ENCOUNTER — Other Ambulatory Visit (HOSPITAL_COMMUNITY): Payer: Self-pay

## 2023-11-18 ENCOUNTER — Ambulatory Visit (HOSPITAL_COMMUNITY)
Admission: RE | Admit: 2023-11-18 | Discharge: 2023-11-18 | Disposition: A | Discharge status: Home / Self Care | Source: Ambulatory Visit | Attending: Internal Medicine | Admitting: Internal Medicine

## 2023-11-18 DIAGNOSIS — I5032 Chronic diastolic (congestive) heart failure: Secondary | ICD-10-CM | POA: Insufficient documentation

## 2023-11-18 LAB — BASIC METABOLIC PANEL WITH GFR
Anion gap: 7 (ref 5–15)
BUN: 21 mg/dL (ref 8–23)
CO2: 29 mmol/L (ref 22–32)
Calcium: 9.2 mg/dL (ref 8.9–10.3)
Chloride: 103 mmol/L (ref 98–111)
Creatinine, Ser: 0.77 mg/dL (ref 0.44–1.00)
GFR, Estimated: >60 mL/min (ref >60)
Glucose, Bld: 100 mg/dL — ABNORMAL HIGH (ref 70–99)
Potassium: 4.7 mmol/L (ref 3.5–5.1)
Sodium: 139 mmol/L (ref 135–145)

## 2023-11-24 ENCOUNTER — Other Ambulatory Visit (HOSPITAL_COMMUNITY): Payer: Self-pay

## 2023-11-25 ENCOUNTER — Other Ambulatory Visit (HOSPITAL_COMMUNITY): Payer: Self-pay

## 2023-11-25 NOTE — Telephone Encounter (Signed)
 [employer] has denied the appeal for [employer]:

## 2023-11-25 NOTE — Telephone Encounter (Signed)
 Patient informed of the message below.

## 2023-11-25 NOTE — Telephone Encounter (Signed)
 Please let pt know that it was denied-- she has to have had a prior heart attack or stroke looks like

## 2023-11-29 ENCOUNTER — Other Ambulatory Visit (HOSPITAL_COMMUNITY): Payer: Self-pay

## 2023-12-06 ENCOUNTER — Other Ambulatory Visit (HOSPITAL_COMMUNITY): Payer: Self-pay | Admitting: Cardiology

## 2023-12-06 ENCOUNTER — Other Ambulatory Visit (HOSPITAL_COMMUNITY): Payer: Self-pay

## 2023-12-06 MED ORDER — ROSUVASTATIN CALCIUM 20 MG PO TABS
20.0000 mg | ORAL_TABLET | Freq: Every day | ORAL | 3 refills | Status: AC
  Filled 2023-12-06: qty 90, 90d supply, fill #0
  Filled 2024-02-27: qty 90, 90d supply, fill #1
  Filled 2024-05-28: qty 90, 90d supply, fill #2
  Filled 2024-08-27: qty 90, 90d supply, fill #3

## 2023-12-07 ENCOUNTER — Ambulatory Visit (INDEPENDENT_AMBULATORY_CARE_PROVIDER_SITE_OTHER)

## 2023-12-07 ENCOUNTER — Ambulatory Visit (INDEPENDENT_AMBULATORY_CARE_PROVIDER_SITE_OTHER): Admitting: Family Medicine

## 2023-12-07 ENCOUNTER — Encounter: Payer: Self-pay | Admitting: Family Medicine

## 2023-12-07 ENCOUNTER — Other Ambulatory Visit (HOSPITAL_COMMUNITY): Payer: Self-pay

## 2023-12-07 VITALS — BP 122/70 | HR 67 | Temp 97.9°F | Ht 67.0 in | Wt 229.0 lb

## 2023-12-07 DIAGNOSIS — R051 Acute cough: Secondary | ICD-10-CM | POA: Diagnosis not present

## 2023-12-07 DIAGNOSIS — J0101 Acute recurrent maxillary sinusitis: Secondary | ICD-10-CM | POA: Diagnosis not present

## 2023-12-07 DIAGNOSIS — R0602 Shortness of breath: Secondary | ICD-10-CM | POA: Diagnosis not present

## 2023-12-07 MED ORDER — DOXYCYCLINE HYCLATE 100 MG PO TABS
100.0000 mg | ORAL_TABLET | Freq: Two times a day (BID) | ORAL | 0 refills | Status: AC
  Filled 2023-12-07: qty 20, 10d supply, fill #0

## 2023-12-07 MED ORDER — PREDNISONE 20 MG PO TABS
40.0000 mg | ORAL_TABLET | Freq: Every day | ORAL | 0 refills | Status: AC
  Filled 2023-12-07: qty 8, 4d supply, fill #0

## 2023-12-07 NOTE — Progress Notes (Signed)
 Acute Office Visit  Subjective:     Patient ID: IMAGINE NEST, female    DOB: Jan 04, 1951, 73 y.o.   MRN: [id number]  Chief Complaint  Patient presents with   Cough    Non-productive x21 days   Headache    X1 month   Ear Pain    Patient complains of left ear pain x4 days    Pt is here with acute symptoms. She had COVID on 3/11 and was given paxlovid  for treatment then had sinus pressure and congestion persistently and Dr. Ethel Henry saw her on 3.20 and was given cefdinir -- took 7 days of the cefdinir . States that she got a little bit better but then it got worse again. Ear pain, sinus pain, headaches, no fever or chills in the past 2 weeks. States that the headaches are excruciating, more of on the right above the mastoid bone. Is also having chest symptoms -- some SOB and difficulty breathing. Feels very weak/ no energy. States that the coughing has also persisted    Review of Systems  All other systems reviewed and are negative.       Objective:    BP 122/70   Pulse 67   Temp 97.9 F (36.6 C) (Oral)   Ht 5\' 7"  (1.702 m)   Wt 229 lb (103.9 kg)   SpO2 94%   BMI 35.87 kg/m    Physical Exam Vitals reviewed.  Constitutional:      Appearance: She is well-developed. She is obese.  HENT:     Right Ear: Tympanic membrane is not erythematous.     Left Ear: Tympanic membrane is not erythematous.     Nose: Nasal tenderness present.     Mouth/Throat:     Mouth: Mucous membranes are moist.  Cardiovascular:     Rate and Rhythm: Normal rate and regular rhythm.     Heart sounds: Normal heart sounds. No murmur heard. Pulmonary:     Effort: Pulmonary effort is normal.     Breath sounds: Wheezing (expiratory wheezing in the upper airways with cough) present.  Abdominal:     Palpations: Abdomen is soft.  Musculoskeletal:     Cervical back: Normal range of motion.  Neurological:     Mental Status: She is alert.     No results found for any visits on 12/07/23.       Assessment & Plan:   Problem List Items Addressed This Visit   None Visit Diagnoses       Acute cough    -  Primary   Relevant Medications   predniSONE  (DELTASONE ) 20 MG tablet   Other Relevant Orders   DG Chest 2 View     Acute recurrent maxillary sinusitis       Relevant Medications   doxycycline  (VIBRA -TABS) 100 MG tablet   predniSONE  (DELTASONE ) 20 MG tablet      There is mild expiratory wheezing with cough, will treat with prednisone  burst and doxycycline . Will get chest film today to look for signs of pneumonia.  Meds ordered this encounter  Medications   doxycycline  (VIBRA -TABS) 100 MG tablet    Sig: Take 1 tablet (100 mg total) by mouth 2 (two) times daily for 10 days.    Dispense:  20 tablet    Refill:  0   predniSONE  (DELTASONE ) 20 MG tablet    Sig: Take 2 tablets (40 mg total) by mouth daily with breakfast for 4 days.    Dispense:  8 tablet  Refill:  0    No follow-ups on file.  Aida House, MD

## 2023-12-08 ENCOUNTER — Encounter: Payer: Self-pay | Admitting: Family Medicine

## 2023-12-16 ENCOUNTER — Ambulatory Visit (HOSPITAL_COMMUNITY)
Admission: RE | Admit: 2023-12-16 | Discharge: 2023-12-16 | Disposition: A | Discharge status: Home / Self Care | Source: Ambulatory Visit | Attending: Cardiology | Admitting: Cardiology

## 2023-12-16 DIAGNOSIS — I5032 Chronic diastolic (congestive) heart failure: Secondary | ICD-10-CM | POA: Insufficient documentation

## 2023-12-16 LAB — HEPATIC FUNCTION PANEL
ALT: 17 U/L (ref 0–44)
AST: 20 U/L (ref 15–41)
Albumin: 3.3 g/dL — ABNORMAL LOW (ref 3.5–5.0)
Alkaline Phosphatase: 89 U/L (ref 38–126)
Bilirubin, Direct: 0.1 mg/dL (ref 0.0–0.2)
Indirect Bilirubin: 0.5 mg/dL (ref 0.3–0.9)
Total Bilirubin: 0.6 mg/dL (ref 0.0–1.2)
Total Protein: 6.7 g/dL (ref 6.5–8.1)

## 2023-12-16 LAB — LIPID PANEL
Cholesterol: 133 mg/dL (ref 0–200)
HDL: 55 mg/dL (ref >40)
LDL Cholesterol: 47 mg/dL (ref 0–99)
Total CHOL/HDL Ratio: 2.4 ratio
Triglycerides: 157 mg/dL — ABNORMAL HIGH (ref <150)
VLDL: 31 mg/dL (ref 0–40)

## 2023-12-17 ENCOUNTER — Encounter: Payer: Self-pay | Admitting: Emergency Medicine

## 2023-12-21 NOTE — Telephone Encounter (Signed)
 Please let her know that I'm sorry that she did not receive. Let's work on getting her one. Thanks.

## 2023-12-23 DIAGNOSIS — J449 Chronic obstructive pulmonary disease, unspecified: Secondary | ICD-10-CM | POA: Diagnosis not present

## 2023-12-23 NOTE — Telephone Encounter (Signed)
 Spoke with pt regarding [employer]. Pt is upset regarding she has not received her flutter valve. I have sent community message to [employer] regarding this. Waiting for feedback.

## 2023-12-24 NOTE — Telephone Encounter (Signed)
 [employer] is going to contact patient.  Sent Mychart message letting pt know.  Nothing further needed.

## 2024-01-07 ENCOUNTER — Ambulatory Visit: Attending: Cardiology | Admitting: Pharmacist

## 2024-01-07 VITALS — BP 130/80 | HR 62

## 2024-01-07 DIAGNOSIS — E663 Overweight: Secondary | ICD-10-CM | POA: Diagnosis not present

## 2024-01-07 NOTE — Progress Notes (Signed)
 Patient ID: Annette [name]                 DOB: [birth year]                    MRN: [id number]     HPI: Annette [name] is a 73 y.o. female patient referred to pharmacy clinic by Dr. Mitzie Anda to initiate GLP1-RA therapy. PMH is significant for HTN, HLD, COPD, CAD on CT, Diastolic HF and obesity. Most recent BMI 35.  Patient presents today to Pharm.D. clinic to discuss GLP-1 therapy.  GLP therapy will not be covered under patient's insurance.  She is also concerned about taking a medication long-term for weight loss.  We did discuss the medication and how to avoid weight gain if she were to come off of it.  However cash price options are too high and patient not interested in taking.  She has joined [employer] and is working on improving her diet.  She was given the name of a personal trainer and some strength training exercises she could try.  Concerned that her home blood pressure cuff does not work.  It is a wrist cuff and is giving her crazy high readings.  Blood pressure today in clinic 130/80.  I advised her that she can get a blood pressure cuff at the [facility] pharmacy for $35.  Her arm was measured to make sure would fit.  I asked her to check her blood pressure at home and report to me if blood pressure above 130/80.  Diet: no fast food Joined weight watcher Fresh produce Not enough protein Breakfast : [employer] protein shakes Lunch: salad with eggs, walnuts  Exercise: walk on treadmill daily , chair yoga daily  Family History:  Family History  Problem Relation Age of Onset   Alzheimer's disease Father        developed CAD late in life   High blood pressure Father    High Cholesterol Father    Heart disease Father    AAA (abdominal aortic aneurysm) Father    Stroke Mother        died @ ~35 y/o   CAD Mother 56       s/p CABG   High blood pressure Mother    High Cholesterol Mother    Heart disease Mother    Thyroid  disease Mother    Anxiety disorder Mother    Alcoholism  Mother    Breast cancer Sister      Social History:   Labs: Lab Results  Component Value Date   HGBA1C 5.7 (A) 09/21/2023    Wt Readings from Last 1 Encounters:  12/07/23 229 lb (103.9 kg)    BP Readings from Last 1 Encounters:  12/07/23 122/70   Pulse Readings from Last 1 Encounters:  12/07/23 67       Component Value Date/Time   CHOL 133 12/16/2023 0841   CHOL 152 04/23/2021 0815   TRIG 157 (H) 12/16/2023 0841   HDL 55 12/16/2023 0841   HDL 48 04/23/2021 0815   CHOLHDL 2.4 12/16/2023 0841   VLDL 31 12/16/2023 0841   LDLCALC 47 12/16/2023 0841   LDLCALC 78 04/23/2021 0815    Past Medical History:  Diagnosis Date   Arthritis    Right hip, end stage   Back pain    CHF (congestive heart failure) (HCC)    COPD (chronic obstructive pulmonary disease) (HCC)    Coronary artery calcification seen on computed tomography 02/2018  Coronary calcium  score 79.  Coronary CT angiogram: Mild CAD and proximal LAD, proximal RCA and mid LCx.  Moderate plaque ostial LCx.  Mildly dilated pulmonary artery, suggestive of possible pulmonary hypertension   Difficulty sleeping    DUE TO PAIN   Edema of both lower extremities    Emphysema of lung (HCC)    Fluid retention    TAKES HCTZ   GERD (gastroesophageal reflux disease)    Hemorrhoids    Hyperlipidemia    Hypertension    Hypothyroidism    IBS (irritable bowel syndrome)    Irritable bowel syndrome    Joint pain    Macular degeneration    Osteopenia    Shingles 2004   SOB (shortness of breath)    Tremors of nervous system    TAKES PROPRANOLOL  TO TX   Varicose veins    Vitamin D  deficiency     Current Outpatient Medications on File Prior to Visit  Medication Sig Dispense Refill   albuterol  (VENTOLIN  HFA) 108 (90 Base) MCG/ACT inhaler Inhale 2 puffs into the lungs every 6 (six) hours as needed for wheezing or shortness of breath. 6.7 g 5   aspirin EC 81 MG tablet Take 81 mg by mouth daily.     Cholecalciferol (VITAMIN  D) 50 MCG (2000 UT) tablet Take 2,000 Units by mouth daily.     dapagliflozin  propanediol (FARXIGA ) 10 MG TABS tablet Take 1 tablet (10 mg) by mouth daily before breakfast. 90 tablet 3   fexofenadine  (ALLEGRA ) 180 MG tablet Take 180 mg by mouth 2 (two) times daily.     furosemide  (LASIX ) 20 MG tablet Take 1 tablet (20 mg total) by mouth every other day. 30 tablet 3   guaiFENesin  (MUCINEX ) 600 MG 12 hr tablet Take 600 mg by mouth as needed.     levothyroxine  (SYNTHROID ) 125 MCG tablet Take 1 tablet (125 mcg total) by mouth daily before breakfast. 90 tablet 3   Magnesium 400 MG TABS Take 400 mg by mouth.     Multiple Vitamins-Minerals (PRESERVISION AREDS 2) CAPS Take 1 capsule by mouth 2 (two) times daily.     naproxen sodium (ALEVE) 220 MG tablet Take 220 mg by mouth daily as needed (pain).     propranolol  ER (INDERAL  LA) 60 MG 24 hr capsule Take 1 capsule (60 mg total) by mouth daily. Needs appt for further refills 90 capsule 0   rosuvastatin  (CRESTOR ) 20 MG tablet Take 1 tablet (20 mg total) by mouth daily. 90 tablet 3   salmeterol (SEREVENT  DISKUS) 50 MCG/ACT diskus inhaler Inhale 1 puff into the lungs 2 times daily. 180 each 1   triamcinolone  cream (KENALOG ) 0.1 % Apply to back twice daily as needed for itch 454 g 1   valACYclovir  (VALTREX ) 500 MG tablet Take 1 tablet (500 mg total) by mouth as needed. 90 tablet 0   No current facility-administered medications on file prior to visit.    Allergies  Allergen Reactions   Augmentin [Amoxicillin-Pot Clavulanate] Nausea And Vomiting   Codeine Other (See Comments)    Causes her to stay awake    Levofloxacin  Other (See Comments)    Severe joint and muscle pain   Other Hives and Other (See Comments)    IV contrast    Iodinated Contrast Media Rash     Assessment/Plan:  1. Weight loss -  GLP therapy will not be covered under patient's insurance.  She is also concerned about taking a medication long-term for weight loss.  We did discuss  the medication and how to avoid weight gain if she were to come off of it.  However cash price options are too high and patient not interested in taking.  Discussed increasing resistance training.  Patient referred to personal trainer.  Encouraged her to continue to make changes to her diet.  Handout provided.  Bryelle Spiewak D Sayyid Harewood, Pharm.Monika Annas, CPP [facility] A Division of [facility] [address], [place], [place] [address]  Phone: [phone]; Fax: [phone]

## 2024-01-07 NOTE — Patient Instructions (Addendum)
 Annette [name] [email or web address] [id number]

## 2024-01-26 ENCOUNTER — Other Ambulatory Visit (HOSPITAL_COMMUNITY): Payer: Self-pay | Admitting: Cardiology

## 2024-01-26 ENCOUNTER — Other Ambulatory Visit: Payer: Self-pay

## 2024-01-27 ENCOUNTER — Other Ambulatory Visit: Payer: Self-pay

## 2024-01-28 ENCOUNTER — Other Ambulatory Visit (HOSPITAL_COMMUNITY): Payer: Self-pay

## 2024-01-28 MED ORDER — FUROSEMIDE 20 MG PO TABS
20.0000 mg | ORAL_TABLET | ORAL | 3 refills | Status: AC
  Filled 2024-01-28: qty 30, 60d supply, fill #0
  Filled 2024-03-28: qty 30, 60d supply, fill #1
  Filled 2024-05-28: qty 30, 60d supply, fill #2
  Filled 2024-07-27: qty 30, 60d supply, fill #3

## 2024-02-03 ENCOUNTER — Other Ambulatory Visit (HOSPITAL_COMMUNITY): Payer: Self-pay

## 2024-02-03 DIAGNOSIS — M25561 Pain in right knee: Secondary | ICD-10-CM | POA: Diagnosis not present

## 2024-02-03 MED ORDER — DICLOFENAC SODIUM 1 % EX GEL
2.0000 g | Freq: Four times a day (QID) | CUTANEOUS | 0 refills | Status: AC
  Filled 2024-02-03: qty 100, 25d supply, fill #0

## 2024-02-03 MED ORDER — PREDNISONE 5 MG (21) PO TBPK
ORAL_TABLET | ORAL | 0 refills | Status: AC
  Filled 2024-02-03: qty 21, 6d supply, fill #0

## 2024-02-05 ENCOUNTER — Other Ambulatory Visit: Payer: Self-pay | Admitting: Family Medicine

## 2024-02-07 ENCOUNTER — Other Ambulatory Visit: Payer: Self-pay

## 2024-02-07 ENCOUNTER — Other Ambulatory Visit (HOSPITAL_COMMUNITY): Payer: Self-pay

## 2024-02-07 MED ORDER — PROPRANOLOL HCL ER 60 MG PO CP24
60.0000 mg | ORAL_CAPSULE | Freq: Every day | ORAL | 0 refills | Status: DC
Start: 2024-02-07 — End: 2024-05-09
  Filled 2024-02-07: qty 90, 90d supply, fill #0

## 2024-02-12 ENCOUNTER — Other Ambulatory Visit (HOSPITAL_COMMUNITY): Payer: Self-pay

## 2024-02-14 ENCOUNTER — Other Ambulatory Visit (HOSPITAL_COMMUNITY): Payer: Self-pay

## 2024-02-14 ENCOUNTER — Ambulatory Visit: Payer: Medicare Other

## 2024-02-14 ENCOUNTER — Other Ambulatory Visit: Payer: Self-pay

## 2024-02-22 DIAGNOSIS — H353132 Nonexudative age-related macular degeneration, bilateral, intermediate dry stage: Secondary | ICD-10-CM | POA: Diagnosis not present

## 2024-02-22 DIAGNOSIS — H43393 Other vitreous opacities, bilateral: Secondary | ICD-10-CM | POA: Diagnosis not present

## 2024-02-22 DIAGNOSIS — H35033 Hypertensive retinopathy, bilateral: Secondary | ICD-10-CM | POA: Diagnosis not present

## 2024-02-22 DIAGNOSIS — H26493 Other secondary cataract, bilateral: Secondary | ICD-10-CM | POA: Diagnosis not present

## 2024-02-24 DIAGNOSIS — M25561 Pain in right knee: Secondary | ICD-10-CM | POA: Diagnosis not present

## 2024-02-27 ENCOUNTER — Other Ambulatory Visit (HOSPITAL_COMMUNITY): Payer: Self-pay | Admitting: Cardiology

## 2024-02-27 ENCOUNTER — Other Ambulatory Visit: Payer: Self-pay | Admitting: Acute Care

## 2024-02-27 DIAGNOSIS — J441 Chronic obstructive pulmonary disease with (acute) exacerbation: Secondary | ICD-10-CM

## 2024-02-28 ENCOUNTER — Other Ambulatory Visit: Payer: Self-pay

## 2024-02-28 ENCOUNTER — Other Ambulatory Visit (HOSPITAL_COMMUNITY): Payer: Self-pay

## 2024-02-28 MED ORDER — DAPAGLIFLOZIN PROPANEDIOL 10 MG PO TABS
10.0000 mg | ORAL_TABLET | Freq: Every day | ORAL | 3 refills | Status: AC
  Filled 2024-02-28: qty 90, 90d supply, fill #0
  Filled 2024-05-28: qty 90, 90d supply, fill #1
  Filled 2024-08-07: qty 90, 90d supply, fill #2

## 2024-02-28 MED ORDER — ALBUTEROL SULFATE HFA 108 (90 BASE) MCG/ACT IN AERS
2.0000 | INHALATION_SPRAY | Freq: Four times a day (QID) | RESPIRATORY_TRACT | 5 refills | Status: AC | PRN
  Filled 2024-02-28: qty 6.7, 25d supply, fill #0

## 2024-03-01 ENCOUNTER — Other Ambulatory Visit (HOSPITAL_COMMUNITY): Payer: Self-pay

## 2024-03-02 ENCOUNTER — Ambulatory Visit (INDEPENDENT_AMBULATORY_CARE_PROVIDER_SITE_OTHER): Admitting: Family Medicine

## 2024-03-02 DIAGNOSIS — Z0000 Encounter for general adult medical examination without abnormal findings: Secondary | ICD-10-CM | POA: Diagnosis not present

## 2024-03-02 NOTE — Progress Notes (Signed)
 PATIENT CHECK-IN and HEALTH RISK ASSESSMENT QUESTIONNAIRE:  -completed by phone/video for upcoming Medicare Preventive Visit  Pre-Visit Check-in: 1)Vitals (height, wt, BP, etc) - record in vitals section for visit on day of visit Request home vitals (wt, BP, etc.) and enter into vitals, THEN update Vital Signs SmartPhrase below at the top of the HPI. See below.  2)Review and Update Medications, Allergies PMH, Surgeries, Social history in Epic 3)Hospitalizations in the last year with date/reason? n  4)Review and Update Care Team (patient's specialists) in Epic 5) Complete PHQ9 in Epic  6) Complete Fall Screening in Epic 7)Review all Health Maintenance Due and order if not done.  Medicare Wellness Patient Questionnaire:  Answer theses question about your habits: How often do you have a drink containing alcohol?n How many drinks containing alcohol do you have on a typical day when you are drinking?na How often do you have six or more drinks on one occasion?na Have you ever smoked?y Quit date if applicable? Quit in 2010  How many packs a day do/did you smoke? < ppd Do you use smokeless tobacco?n Do you use an illicit drugs?n On average, how many days per week do you engage in moderate to strenuous exercise (like a brisk walk)? Had been walking on the treadmill daily and doing chair yoga - since tearing meniscus doing home exercises and is going to start PT Typical breakfast: fairlife protein shake Typical lunch: salad or sandwich with Camie Ruth low cal bread Typical dinner: fish or chicken with veggies Typical snacks: 80 cal protein bar or skinny cup popcorn - doing weight -watcher, tries to eats some fresh fruit daily  Beverages: water- drinks a lot of water  Answer theses question about your everyday activities: Can you perform most household chores?y Are you deaf or have significant trouble hearing?n Do you feel that you have a problem with memory? Some, but usually come to her Do  you feel safe at home?y Last dentist visit? Goes on regular basis - went last week 8. Do you have any difficulty performing your everyday activities?n Are you having any difficulty walking, taking medications on your own, and or difficulty managing daily home needs?n Do you have difficulty walking or climbing stairs?n Do you have difficulty dressing or bathing?n Do you have difficulty doing errands alone such as visiting a doctor's office or shopping?n Do you currently have any difficulty preparing food and eating?n Do you currently have any difficulty using the toilet?n Do you have any difficulty managing your finances?n Do you have any difficulties with housekeeping of managing your housekeeping?n   Do you have Advanced Directives in place (Living Will, Healthcare Power or Attorney)? y   Last eye Exam and location? Dr. Lacie, went last week   Do you currently use prescribed or non-prescribed narcotic or opioid pain medications?n  Do you have a history or close family history of breast, ovarian, tubal or peritoneal cancer or a family member with BRCA (breast cancer susceptibility 1 and 2) gene mutations? Sister has breast ca      ----------------------------------------------------------------------------------------------------------------------------------------------------------------------------------------------------------------------  Because this visit was a virtual/telehealth visit, some criteria may be missing or patient reported. Any vitals not documented were not able to be obtained and vitals that have been documented are patient reported.    MEDICARE ANNUAL PREVENTIVE VISIT WITH PROVIDER: (Welcome to Medicare, initial annual wellness or annual wellness exam)  Virtual Visit via Phone Note  I connected with Annette [name] on 03/02/24 by phone and verified that I am speaking  with the correct person using two identifiers. She prefers phone.  Location patient:  home Location provider:work or home office Persons participating in the virtual visit: patient, provider  Concerns and/or follow up today: Has torn meniscus - seeing specialist, doing PT.    See HM section in Epic for other details of completed HM.    ROS: negative for report of fevers, unintentional weight loss, vision changes, vision loss, hearing loss or change, chest pain, sob, hemoptysis, melena, hematochezia, hematuria, falls, bleeding or bruising, thoughts of suicide or self harm, memory loss  Patient-completed extensive health risk assessment - reviewed and discussed with the patient: See Health Risk Assessment completed with patient prior to the visit either above or in recent phone note. This was reviewed in detailed with the patient today and appropriate recommendations, orders and referrals were placed as needed per Summary below and patient instructions.   Review of Medical History: -PMH, PSH, Family History and current specialty and care providers reviewed and updated and listed below   Patient Care Team: Ozell Heron HERO, MD as PCP - General (Family Medicine) Anner Alm ORN, MD as PCP - Cardiology (Cardiology) Signa Dire, MD (Inactive) as Consulting Physician (Family Medicine)   Past Medical History:  Diagnosis Date   Arthritis    Right hip, end stage   Back pain    CHF (congestive heart failure) (HCC)    COPD (chronic obstructive pulmonary disease) (HCC)    Coronary artery calcification seen on computed tomography 02/2018   Coronary calcium  score 79.  Coronary CT angiogram: Mild CAD and proximal LAD, proximal RCA and mid LCx.  Moderate plaque ostial LCx.  Mildly dilated pulmonary artery, suggestive of possible pulmonary hypertension   Difficulty sleeping    DUE TO PAIN   Edema of both lower extremities    Emphysema of lung (HCC)    Fluid retention    TAKES HCTZ   GERD (gastroesophageal reflux disease)    Hemorrhoids    Hyperlipidemia    Hypertension     Hypothyroidism    IBS (irritable bowel syndrome)    Irritable bowel syndrome    Joint pain    Macular degeneration    Osteopenia    Shingles 2004   SOB (shortness of breath)    Tremors of nervous system    TAKES PROPRANOLOL  TO TX   Varicose veins    Vitamin D  deficiency     Past Surgical History:  Procedure Laterality Date   ABDOMINAL HYSTERECTOMY  2000   CATARACT EXTRACTION Right    CHOLECYSTECTOMY  1994   COLONOSCOPY     ERCP  2004   sludge in CBD (jaundiced)   ESOPHAGOGASTRODUODENOSCOPY     INCISE AND DRAIN ABCESS Left 05/2016   wrist   INCISION / DRAINAGE HAND / FINGER     LEFT INDEX FINGER   JOINT REPLACEMENT     Lexiscan  Myoview   06/2019   Lexiscan  Myoview  07/05/2019: LOW RISK.  EF 60 to 65%.  Fixed anteroseptal apical defect consistent with breast attenuation.   OVARIAN CYST REMOVAL  8010,8019   RIGHT HEART CATH N/A 10/02/2022   Procedure: RIGHT HEART CATH;  Surgeon: Rolan Ezra RAMAN, MD;  Location: [facility] INVASIVE CV LAB;  Service: Cardiovascular;  Laterality: N/A;   ROTATOR CUFF REPAIR     left   TOTAL HIP ARTHROPLASTY  06/2010   right   TOTAL HIP ARTHROPLASTY Left 03/22/2015   Procedure: LEFT TOTAL HIP ARTHROPLASTY ANTERIOR APPROACH;  Surgeon: Dempsey Moan, MD;  Location: [facility];  Service: Orthopedics;  Laterality: Left;   TRANSTHORACIC ECHOCARDIOGRAM  05/31/2019   EF 60-65%. Gr 1 DD (normal for Age). Normal valves & chamber sizes.     Social History   Socioeconomic History   Marital status: Divorced    Spouse name: Not on file   Number of children: 1   Years of education: Not on file   Highest education level: Associate degree: academic program  Occupational History   Occupation: retired Charity fundraiser    Comment: RN endoscopyt  Tobacco Use   Smoking status: Former    Current packs/day: 0.00    Average packs/day: 1 pack/day for 38.0 years (38.0 ttl pk-yrs)    Types: Cigarettes    Start date: 03/15/1971    Quit date: 03/14/2009    Years since quitting: 14.9    Smokeless tobacco: Never  Substance and Sexual Activity   Alcohol use: Yes    Comment: beer every 6-8 months   Drug use: No   Sexual activity: Not on file  Other Topics Concern   Not on file  Social History Narrative   [facility]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 1 son   1 caffeine drink daily   Updated 06/01/2013   Social Drivers of Health   Financial Resource Strain: Low Risk  (02/28/2024)   Overall Financial Resource Strain (CARDIA)    Difficulty of Paying Living Expenses: Not hard at all  Food Insecurity: No Food Insecurity (02/28/2024)   Hunger Vital Sign    Worried About Running Out of Food in the Last Year: Never true    Ran Out of Food in the Last Year: Never true  Transportation Needs: No Transportation Needs (02/28/2024)   PRAPARE - Administrator, Civil Service (Medical): No    Lack of Transportation (Non-Medical): No  Physical Activity: Insufficiently Active (02/28/2024)   Exercise Vital Sign    Days of Exercise per Week: 3 days    Minutes of Exercise per Session: 20 min  Stress: No Stress Concern Present (02/28/2024)   [employer] of Occupational Health - Occupational Stress Questionnaire    Feeling of Stress: Not at all  Social Connections: Socially Isolated (02/28/2024)   Social Connection and Isolation Panel    Frequency of Communication with Friends and Family: More than three times a week    Frequency of Social Gatherings with Friends and Family: Once a week    Attends Religious Services: Patient declined    Database administrator or Organizations: No    Attends Engineer, structural: Not on file    Marital Status: Divorced  Catering manager Violence: Not on file    Family History  Problem Relation Age of Onset   Alzheimer's disease Father        developed CAD late in life   High blood pressure Father    High Cholesterol Father    Heart disease Father    AAA (abdominal aortic aneurysm) Father    Stroke Mother        died @ ~30 y/o    CAD Mother 16       s/p CABG   High blood pressure Mother    High Cholesterol Mother    Heart disease Mother    Thyroid  disease Mother    Anxiety disorder Mother    Alcoholism Mother    Breast cancer Sister     Current Outpatient Medications on File Prior to Visit  Medication Sig Dispense Refill   albuterol  (  VENTOLIN  HFA) 108 (90 Base) MCG/ACT inhaler Inhale 2 puffs into the lungs every 6 (six) hours as needed for wheezing or shortness of breath. 6.7 g 5   aspirin EC 81 MG tablet Take 81 mg by mouth daily.     Cholecalciferol (VITAMIN D ) 50 MCG (2000 UT) tablet Take 2,000 Units by mouth daily.     dapagliflozin  propanediol (FARXIGA ) 10 MG TABS tablet Take 1 tablet (10 mg) by mouth daily before breakfast. 90 tablet 3   diclofenac  Sodium (VOLTAREN ) 1 % GEL Apply 2 g topically to the affected area(s) 4 (four) times daily. 100 g 0   fexofenadine  (ALLEGRA ) 180 MG tablet Take 180 mg by mouth daily.     furosemide  (LASIX ) 20 MG tablet Take 1 tablet (20 mg total) by mouth every other day. 30 tablet 3   guaiFENesin  (MUCINEX ) 600 MG 12 hr tablet Take 600 mg by mouth as needed.     levothyroxine  (SYNTHROID ) 125 MCG tablet Take 1 tablet (125 mcg total) by mouth daily before breakfast. 90 tablet 3   Magnesium 400 MG TABS Take 400 mg by mouth.     Multiple Vitamins-Minerals (PRESERVISION AREDS 2) CAPS Take 1 capsule by mouth 2 (two) times daily.     naproxen sodium (ALEVE) 220 MG tablet Take 220 mg by mouth daily as needed (pain).     propranolol  ER (INDERAL  LA) 60 MG 24 hr capsule Take 1 capsule (60 mg total) by mouth daily. Needs appt for further refills 90 capsule 0   rosuvastatin  (CRESTOR ) 20 MG tablet Take 1 tablet (20 mg total) by mouth daily. 90 tablet 3   salmeterol (SEREVENT  DISKUS) 50 MCG/ACT diskus inhaler Inhale 1 puff into the lungs 2 times daily. 180 each 1   triamcinolone  cream (KENALOG ) 0.1 % Apply to back twice daily as needed for itch 454 g 1   valACYclovir  (VALTREX ) 500 MG tablet  Take 1 tablet (500 mg total) by mouth as needed. 90 tablet 0   No current facility-administered medications on file prior to visit.    Allergies  Allergen Reactions   Augmentin [Amoxicillin-Pot Clavulanate] Nausea And Vomiting   Codeine Other (See Comments)    Causes her to stay awake    Levofloxacin  Other (See Comments)    Severe joint and muscle pain   Other Hives and Other (See Comments)    IV contrast    Iodinated Contrast Media Rash       Physical Exam Vitals requested from patient and listed below if patient had equipment and was able to obtain at home for this virtual visit: There were no vitals filed for this visit. Estimated body mass index is 35.87 kg/m as calculated from the following:   Height as of 12/07/23: 5' 7 (1.702 m).   Weight as of 12/07/23: 229 lb (103.9 kg).  EKG (optional): deferred due to virtual visit  GENERAL: alert, oriented, no acute distress detected, full vision exam deferred due to pandemic and/or virtual encounter  PSYCH/NEURO: pleasant and cooperative, no obvious depression or anxiety, speech and thought processing grossly intact, Cognitive function grossly intact  Flowsheet Row Office Visit from [facility]/04/2022 in [facility]  PHQ-9 Total Score 2        03/02/2024   12:44 PM 09/21/2023   11:23 AM 09/15/2022   10:26 AM 03/04/2022    8:01 AM 01/30/2022    8:02 AM  Depression screen PHQ 2/9  Decreased Interest 0 0 0 0 0  Down,  Depressed, Hopeless 0 0 0 0 0  PHQ - 2 Score 0 0 0 0 0  Altered sleeping    2 0  Tired, decreased energy    0 0  Change in appetite    0 0  Feeling bad or failure about yourself     0 0  Trouble concentrating    0 0  Moving slowly or fidgety/restless    0 0  Suicidal thoughts    0 0  PHQ-9 Score    2 0  Difficult doing work/chores    Not difficult at all        09/15/2022   10:26 AM 09/21/2023   11:23 AM 09/30/2023    2:24 PM 02/28/2024   12:12 PM 03/02/2024   12:44 PM  Fall Risk   Falls in the past year? 0 0 0 0 1  Was there an injury with Fall? 0 0  0 0  Fall Risk Category Calculator 0 0  0  1  Patient at Risk for Falls Due to No Fall Risks No Fall Risks     Fall risk Follow up Falls evaluation completed    Falls evaluation completed;Education provided     Patient-reported     SUMMARY AND PLAN:  Encounter for Medicare annual wellness exam  Discussed applicable health maintenance/preventive health measures and advised and referred or ordered per patient preferences: -has history of osteoporosis, declines further dexa as does not want treatment, she is taking calcium  and vit d and doing exercise -declined covid vaccine -discussed hep c screening and she can request with labs -advised colon caner screening and offered to order - she says she prefers I don't order and that she will call the gastroenterologist she worked with and check with her insurance -she is doing lung cancer screening with her pulmonologist  Health Maintenance  Topic Date Due   Hepatitis C Screening  Never done   Colonoscopy  Never done   COVID-19 Vaccine (4 - 2024-25 season) 04/18/2023   INFLUENZA VACCINE  03/17/2024   Lung Cancer Screening  05/16/2024   MAMMOGRAM  05/19/2024   Medicare Annual Wellness (AWV)  03/02/2025   DTaP/Tdap/Td (3 - Td or Tdap) 05/31/2026   Pneumococcal Vaccine: 50+ Years  Completed   DEXA SCAN  Completed   Hepatitis B Vaccines  Aged Out   HPV VACCINES  Aged Out   Meningococcal B Vaccine  Aged Out   Zoster Vaccines- Shingrix  Discontinued    Education and counseling on the following was provided based on the above review of health and a plan/checklist for the patient, along with additional information discussed, was provided for the patient in the patient instructions :   -discussed and provided in pt instructions safe balance exercises that can be done at home to improve balance and discussed exercise guidelines for adults with include balance exercises at  least 3 days per week.  -Advised and counseled on a healthy lifestyle - including the importance of a healthy diet, regular physical activity, social connections. -Reviewed patient's current diet. Congratulate on healthy choices. Advised and counseled on a whole foods based healthy diet. A summary of a healthy diet was provided in the Patient Instructions.  -reviewed patient's current physical activity level and discussed exercise guidelines for adults. Discussed community resources and ideas for safe exercise at home to assist in meeting exercise guideline recommendations in a safe and healthy way.  -Advise yearly dental visits at minimum and regular eye exams   Follow  up: see patient instructions     Patient Instructions  I really enjoyed getting to talk with you today! I am available on Tuesdays and Thursdays for virtual visits if you have any questions or concerns, or if I can be of any further assistance.   CHECKLIST FROM ANNUAL WELLNESS VISIT:  -Follow up (please call to schedule if not scheduled after visit):   -yearly for annual wellness visit with primary care office  Here is a list of your preventive care/health maintenance measures and the plan for each if any are due:  PLAN For any measures below that may be due:    1. Please call your gastroenterology clinic today to schedule your colonoscopy. Please let us  know if we can assist.   2. Can request hepatitis C screening with labs.   3. Lung cancer screening with pulmonologist.   4. Can get vaccines at the pharmacy. Please let us  know if you do so that we can update your chart.    Health Maintenance  Topic Date Due   Hepatitis C Screening  Never done   Colonoscopy  Never done   Medicare Annual Wellness (AWV)  12/09/2022   COVID-19 Vaccine (4 - 2024-25 season) 04/18/2023   INFLUENZA VACCINE  03/17/2024   Lung Cancer Screening  05/16/2024   MAMMOGRAM  05/19/2024   DTaP/Tdap/Td (3 - Td or Tdap) 05/31/2026    Pneumococcal Vaccine: 50+ Years  Completed   DEXA SCAN  Completed   Hepatitis B Vaccines  Aged Out   HPV VACCINES  Aged Out   Meningococcal B Vaccine  Aged Out   Zoster Vaccines- Shingrix  Discontinued    -See a dentist at least yearly  -Get your eyes checked and then per your eye specialist's recommendations  -Other issues addressed today:   -I have included below further information regarding a healthy whole foods based diet, physical activity guidelines for adults, stress management and opportunities for social connections. I hope you find this information useful.   -----------------------------------------------------------------------------------------------------------------------------------------------------------------------------------------------------------------------------------------------------------    NUTRITION: -eat real food: lots of colorful vegetables (half the plate) and fruits -5-7 servings of vegetables and fruits per day (fresh or steamed is best), exp. 2 servings of vegetables with lunch and dinner and 2 servings of fruit per day. Berries and greens such as kale and collards are great choices.  -consume on a regular basis:  fresh fruits, fresh veggies, fish, nuts, seeds, healthy oils (such as olive oil, avocado oil), whole grains (make sure for bread/pasta/crackers/etc., that the first ingredient on label contains the word whole), legumes. -can eat small amounts of dairy and lean meat (no larger than the palm of your hand), but avoid processed meats such as ham, bacon, lunch meat, etc. -drink water -try to avoid fast food and pre-packaged foods, processed meat, ultra processed foods/beverages (donuts, candy, etc.) -most experts advise limiting sodium to < 2300mg  per day, should limit further is any chronic conditions such as high blood pressure, heart disease, diabetes, etc. The American Heart Association advised that < 1500mg  is is ideal -try to avoid  foods/beverages that contain any ingredients with names you do not recognize  -try to avoid foods/beverages  with added sugar or sweeteners/sweets  -try to avoid sweet drinks (including diet drinks): soda, juice, Gatorade, sweet tea, power drinks, diet drinks -try to avoid white rice, white bread, pasta (unless whole grain)  EXERCISE GUIDELINES FOR ADULTS: -if you wish to increase your physical activity, do so gradually and with the approval of your doctor -  STOP and seek medical care immediately if you have any chest pain, chest discomfort or trouble breathing when starting or increasing exercise  -move and stretch your body, legs, feet and arms when sitting for long periods -Physical activity guidelines for optimal health in adults: -get at least 150 minutes per week of moderate exercise (can talk, but not sing); this is about 20-30 minutes of sustained activity 5-7 days per week or two 10-15 minute episodes of sustained activity 5-7 days per week -do some muscle building/resistance training/strength training at least 2 days per week  -balance exercises 3+ days per week:   Stand somewhere where you have something sturdy to hold onto if you lose balance    1) lift up on toes, then back down, start with 5x per day and work up to 20x   2) stand and lift one leg straight out to the side so that foot is a few inches of the floor, start with 5x each side and work up to 20x each side   3) stand on one foot, start with 5 seconds each side and work up to 20 seconds on each side  If you need ideas or help with getting more active:  -Silver sneakers https://tools.silversneakers.com  -Walk with a Doc: http://www.duncan-williams.com/  -try to include resistance (weight lifting/strength building) and balance exercises twice per week: or the following link for ideas: http://castillo-powell.com/  BuyDucts.dk  STRESS  MANAGEMENT: -can try meditating, or just sitting quietly with deep breathing while intentionally relaxing all parts of your body for 5 minutes daily -if you need further help with stress, anxiety or depression please follow up with your primary doctor or contact the wonderful folks at [facility]: [phone]  SOCIAL CONNECTIONS: -options in [place] if you wish to engage in more social and exercise related activities:  -Silver sneakers https://tools.silversneakers.com  -Walk with a Doc: http://www.duncan-williams.com/  -Check out the [facility] Active Adults 50+ section on the [place] of [employer] (hiking clubs, book clubs, cards and games, chess, exercise classes, aquatic classes and much more) - see the website for details: https://www.[place].gov/departments/parks-recreation/active-adults50  -YouTube has lots of exercise videos for different ages and abilities as well  -[facility] (a variety of indoor and outdoor inperson activities for adults). [phone]. [address].  -Virtual Online Classes (a variety of topics): see seniorplanet.org or call [phone]  -consider volunteering at a school, hospice center, church, senior center or elsewhere            Chiquita JONELLE Cramp, DO

## 2024-03-02 NOTE — Patient Instructions (Addendum)
 I really enjoyed getting to talk with you today! I am available on Tuesdays and Thursdays for virtual visits if you have any questions or concerns, or if I can be of any further assistance.   CHECKLIST FROM ANNUAL WELLNESS VISIT:  -Follow up (please call to schedule if not scheduled after visit):   -yearly for annual wellness visit with primary care office  Here is a list of your preventive care/health maintenance measures and the plan for each if any are due:  PLAN For any measures below that may be due:    1. Please call your gastroenterology clinic today to schedule your colonoscopy. Please let us  know if we can assist.   2. Can request hepatitis C screening with labs.   3. Lung cancer screening with pulmonologist.   4. Can get vaccines at the pharmacy. Please let us  know if you do so that we can update your chart.    Health Maintenance  Topic Date Due   Hepatitis C Screening  Never done   Colonoscopy  Never done   Medicare Annual Wellness (AWV)  12/09/2022   COVID-19 Vaccine (4 - 2024-25 season) 04/18/2023   INFLUENZA VACCINE  03/17/2024   Lung Cancer Screening  05/16/2024   MAMMOGRAM  05/19/2024   DTaP/Tdap/Td (3 - Td or Tdap) 05/31/2026   Pneumococcal Vaccine: 50+ Years  Completed   DEXA SCAN  Completed   Hepatitis B Vaccines  Aged Out   HPV VACCINES  Aged Out   Meningococcal B Vaccine  Aged Out   Zoster Vaccines- Shingrix  Discontinued    -See a dentist at least yearly  -Get your eyes checked and then per your eye specialist's recommendations  -Other issues addressed today:   -I have included below further information regarding a healthy whole foods based diet, physical activity guidelines for adults, stress management and opportunities for social connections. I hope you find this information useful.    -----------------------------------------------------------------------------------------------------------------------------------------------------------------------------------------------------------------------------------------------------------    NUTRITION: -eat real food: lots of colorful vegetables (half the plate) and fruits -5-7 servings of vegetables and fruits per day (fresh or steamed is best), exp. 2 servings of vegetables with lunch and dinner and 2 servings of fruit per day. Berries and greens such as kale and collards are great choices.  -consume on a regular basis:  fresh fruits, fresh veggies, fish, nuts, seeds, healthy oils (such as olive oil, avocado oil), whole grains (make sure for bread/pasta/crackers/etc., that the first ingredient on label contains the word whole), legumes. -can eat small amounts of dairy and lean meat (no larger than the palm of your hand), but avoid processed meats such as ham, bacon, lunch meat, etc. -drink water -try to avoid fast food and pre-packaged foods, processed meat, ultra processed foods/beverages (donuts, candy, etc.) -most experts advise limiting sodium to < 2300mg  per day, should limit further is any chronic conditions such as high blood pressure, heart disease, diabetes, etc. The American Heart Association advised that < 1500mg  is is ideal -try to avoid foods/beverages that contain any ingredients with names you do not recognize  -try to avoid foods/beverages  with added sugar or sweeteners/sweets  -try to avoid sweet drinks (including diet drinks): soda, juice, Gatorade, sweet tea, power drinks, diet drinks -try to avoid white rice, white bread, pasta (unless whole grain)  EXERCISE GUIDELINES FOR ADULTS: -if you wish to increase your physical activity, do so gradually and with the approval of your doctor -STOP and seek medical care immediately if you have any chest pain,  chest discomfort or trouble breathing when starting or  increasing exercise  -move and stretch your body, legs, feet and arms when sitting for long periods -Physical activity guidelines for optimal health in adults: -get at least 150 minutes per week of moderate exercise (can talk, but not sing); this is about 20-30 minutes of sustained activity 5-7 days per week or two 10-15 minute episodes of sustained activity 5-7 days per week -do some muscle building/resistance training/strength training at least 2 days per week  -balance exercises 3+ days per week:   Stand somewhere where you have something sturdy to hold onto if you lose balance    1) lift up on toes, then back down, start with 5x per day and work up to 20x   2) stand and lift one leg straight out to the side so that foot is a few inches of the floor, start with 5x each side and work up to 20x each side   3) stand on one foot, start with 5 seconds each side and work up to 20 seconds on each side  If you need ideas or help with getting more active:  -Silver sneakers https://tools.silversneakers.com  -Walk with a Doc: http://www.duncan-williams.com/  -try to include resistance (weight lifting/strength building) and balance exercises twice per week: or the following link for ideas: http://castillo-powell.com/  BuyDucts.dk  STRESS MANAGEMENT: -can try meditating, or just sitting quietly with deep breathing while intentionally relaxing all parts of your body for 5 minutes daily -if you need further help with stress, anxiety or depression please follow up with your primary doctor or contact the wonderful folks at [facility]: [phone]  SOCIAL CONNECTIONS: -options in [place] if you wish to engage in more social and exercise related activities:  -Silver sneakers https://tools.silversneakers.com  -Walk with a Doc: http://www.duncan-williams.com/  -Check out the [facility] Active Adults 50+  section on the [place] [employer] (hiking clubs, book clubs, cards and games, chess, exercise classes, aquatic classes and much more) - see the website for details: https://www.[place].gov/departments/parks-recreation/active-adults50  -YouTube has lots of exercise videos for different ages and abilities as well  -[facility] (a variety of indoor and outdoor inperson activities for adults). 5395304743. [address]  -Virtual Online Classes (a variety of topics): see seniorplanet.org or call [phone]  -consider volunteering at a school, hospice center, church, senior center or elsewhere

## 2024-03-07 ENCOUNTER — Ambulatory Visit: Admitting: Family Medicine

## 2024-03-16 DIAGNOSIS — M25561 Pain in right knee: Secondary | ICD-10-CM | POA: Diagnosis not present

## 2024-03-17 ENCOUNTER — Other Ambulatory Visit (HOSPITAL_COMMUNITY): Payer: Self-pay

## 2024-03-17 ENCOUNTER — Other Ambulatory Visit (HOSPITAL_BASED_OUTPATIENT_CLINIC_OR_DEPARTMENT_OTHER): Payer: Self-pay

## 2024-03-17 DIAGNOSIS — L820 Inflamed seborrheic keratosis: Secondary | ICD-10-CM | POA: Diagnosis not present

## 2024-03-17 DIAGNOSIS — L718 Other rosacea: Secondary | ICD-10-CM | POA: Diagnosis not present

## 2024-03-17 DIAGNOSIS — L821 Other seborrheic keratosis: Secondary | ICD-10-CM | POA: Diagnosis not present

## 2024-03-17 MED ORDER — METRONIDAZOLE 0.75 % EX CREA
TOPICAL_CREAM | CUTANEOUS | 3 refills | Status: AC
  Filled 2024-03-17: qty 45, 30d supply, fill #0
  Filled 2024-04-21: qty 45, 30d supply, fill #1
  Filled 2024-05-28: qty 45, 30d supply, fill #2
  Filled 2024-08-07: qty 45, 30d supply, fill #3

## 2024-03-27 DIAGNOSIS — M25561 Pain in right knee: Secondary | ICD-10-CM | POA: Diagnosis not present

## 2024-03-29 DIAGNOSIS — M25561 Pain in right knee: Secondary | ICD-10-CM | POA: Diagnosis not present

## 2024-04-03 DIAGNOSIS — M25561 Pain in right knee: Secondary | ICD-10-CM | POA: Diagnosis not present

## 2024-04-06 DIAGNOSIS — M25561 Pain in right knee: Secondary | ICD-10-CM | POA: Diagnosis not present

## 2024-04-20 DIAGNOSIS — S83231D Complex tear of medial meniscus, current injury, right knee, subsequent encounter: Secondary | ICD-10-CM | POA: Diagnosis not present

## 2024-04-21 ENCOUNTER — Other Ambulatory Visit: Payer: Self-pay | Admitting: Acute Care

## 2024-04-21 ENCOUNTER — Other Ambulatory Visit: Payer: Self-pay

## 2024-04-21 ENCOUNTER — Other Ambulatory Visit (HOSPITAL_COMMUNITY): Payer: Self-pay

## 2024-04-25 ENCOUNTER — Other Ambulatory Visit: Payer: Self-pay

## 2024-04-25 ENCOUNTER — Other Ambulatory Visit (HOSPITAL_COMMUNITY): Payer: Self-pay

## 2024-04-25 DIAGNOSIS — S83241A Other tear of medial meniscus, current injury, right knee, initial encounter: Secondary | ICD-10-CM | POA: Diagnosis not present

## 2024-04-25 MED ORDER — SEREVENT DISKUS 50 MCG/ACT IN AEPB
1.0000 | INHALATION_SPRAY | Freq: Two times a day (BID) | RESPIRATORY_TRACT | 1 refills | Status: AC
  Filled 2024-04-25: qty 180, 90d supply, fill #0
  Filled 2024-07-27: qty 180, 90d supply, fill #1

## 2024-04-26 ENCOUNTER — Other Ambulatory Visit (HOSPITAL_COMMUNITY): Payer: Self-pay

## 2024-04-27 ENCOUNTER — Other Ambulatory Visit (HOSPITAL_COMMUNITY): Payer: Self-pay

## 2024-04-27 MED ORDER — CYCLOBENZAPRINE HCL 5 MG PO TABS
5.0000 mg | ORAL_TABLET | Freq: Every evening | ORAL | 0 refills | Status: AC | PRN
  Filled 2024-04-27: qty 30, 30d supply, fill #0

## 2024-05-03 ENCOUNTER — Telehealth: Payer: Self-pay

## 2024-05-03 ENCOUNTER — Other Ambulatory Visit (HOSPITAL_COMMUNITY): Payer: Self-pay

## 2024-05-03 MED ORDER — COMIRNATY 30 MCG/0.3ML IM SUSY
0.5000 mL | PREFILLED_SYRINGE | Freq: Once | INTRAMUSCULAR | 0 refills | Status: AC
  Filled 2024-05-03: qty 0.3, 1d supply, fill #0

## 2024-05-03 NOTE — Telephone Encounter (Signed)
 Copied from CRM #[id number]. Topic: Clinical - Medication Question >> May 03, 2024  9:11 AM Suzen RAMAN wrote: Reason for CRM: patient would like a prescription for the  Covid Vaccine sent to  [facility]

## 2024-05-03 NOTE — Addendum Note (Signed)
 Addended by: CHRISTYNE IDELL LABOR on: 05/03/2024 09:39 AM   Modules accepted: Orders

## 2024-05-03 NOTE — Telephone Encounter (Signed)
 Rx done.

## 2024-05-09 ENCOUNTER — Telehealth: Payer: Self-pay | Admitting: *Deleted

## 2024-05-09 ENCOUNTER — Other Ambulatory Visit: Payer: Self-pay | Admitting: Family Medicine

## 2024-05-09 ENCOUNTER — Other Ambulatory Visit (HOSPITAL_COMMUNITY): Payer: Self-pay

## 2024-05-09 ENCOUNTER — Other Ambulatory Visit: Payer: Self-pay

## 2024-05-09 MED ORDER — COMIRNATY 30 MCG/0.3ML IM SUSY
0.3000 mL | PREFILLED_SYRINGE | Freq: Once | INTRAMUSCULAR | 0 refills | Status: AC
  Filled 2024-05-09: qty 0.3, 1d supply, fill #0

## 2024-05-09 MED ORDER — PROPRANOLOL HCL ER 60 MG PO CP24
60.0000 mg | ORAL_CAPSULE | Freq: Every day | ORAL | 0 refills | Status: DC
  Filled 2024-05-09: qty 30, 30d supply, fill #0

## 2024-05-09 NOTE — Telephone Encounter (Signed)
 Rx done.

## 2024-05-09 NOTE — Telephone Encounter (Signed)
 Copied from CRM #[id number]. Topic: Clinical - Medication Question >> May 03, 2024  9:11 AM Suzen RAMAN wrote: Reason for CRM: patient would like a prescription for the  Covid Vaccine sent to  [facility] >> May 09, 2024 10:19 AM Chiquita SQUIBB wrote: Patient is calling in stating she just spoke to the pharmacy and they never received the covid vaccine prescription. Patient is requesting this be resent.

## 2024-05-10 NOTE — Progress Notes (Signed)
 PCP: Ozell Heron HERO, MD Cardiology: Dr Rolan  Chief complaint: CHF  73 y.o. with history of HTN and COPD was referred by Dr. Rollene for evaluation of pulmonary hypertension.  Patient was a prior smoker, quit in 2010.  She has CAD noted by coronary calcification on chest CT but Cardiolite in 11/20 was normal.  She had a chest CT for lung cancer screening in 9/23, this showed an enlarged pulmonary artery concerning for pulmonary hypertension. She had an echo done after this showing EF 60-65%, normal RV, but no TR jet so unable to estimate PA systolic pressure.    I first saw her in 09/21/22. With mild dyspnea and hx of COPD, there was concern for group 3 pulmonary HTN, and RHC was arranged.  RHC (2/24) showed mildly elevated PCWP and mild pulmonary venous hypertension. Suspect diastolic heart failure as findings not consistent with pulmonary arterial hypertension. HCTZ stopped and Lasix  started.  She returns today for followup of diastolic CHF. She had COVID-19 recently and is recovering from it.  She is still fatigued.  Prior to COVID, she had no dyspnea walking on flat ground and mild dyspnea with hills and stairs.  No orthopnea/PND.  Rare atypical chest pain.  SBP runs 110s-120s at home.   ECG (personally reviewed): NSR, normal  Labs (10/23): LDL 78, K 4.3, creatinine 9.24 Labs (2/24): K 3.6, creatinine 0.77 Labs (3/24): BNP 86 Labs (4/24): LDL 67 Labs (8/24): K 4.3, creatinine 0.87 Labs (11/24): LDL 67  PMH: 1. HTN 2. Hyperlipidemia 3. COPD: Quit smoking in 2010.  4. CAD: Calcified coronaries noted on prior CT chest.  - Cardiolite (11/20): normal 5. Familial tremor 6. H/o THR 7. Diastolic Heart Failure: - Echo (11/23): EF 60-65%, RV normal, unable to estimate PA systolic pressure.  - RHC (2/24): RA mean 8, PA 41/16 ( mean 27), PCWP mean 17, CO/CI (Fick) 6.32/3.08, PVR 1.6 WU  Social History   Socioeconomic History   Marital status: Divorced    Spouse name: Not on file    Number of children: 1   Years of education: Not on file   Highest education level: Associate degree: academic program  Occupational History   Occupation: retired Charity fundraiser    Comment: RN endoscopyt  Tobacco Use   Smoking status: Former    Current packs/day: 0.00    Average packs/day: 1 pack/day for 38.0 years (38.0 ttl pk-yrs)    Types: Cigarettes    Start date: 03/15/1971    Quit date: 03/14/2009    Years since quitting: 15.1   Smokeless tobacco: Never  Substance and Sexual Activity   Alcohol use: Yes    Comment: beer every 6-8 months   Drug use: No   Sexual activity: Not on file  Other Topics Concern   Not on file  Social History Narrative   [facility]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 1 son   1 caffeine drink daily   Updated 06/01/2013   Social Drivers of Health   Financial Resource Strain: Low Risk  (02/28/2024)   Overall Financial Resource Strain (CARDIA)    Difficulty of Paying Living Expenses: Not hard at all  Food Insecurity: No Food Insecurity (02/28/2024)   Hunger Vital Sign    Worried About Running Out of Food in the Last Year: Never true    Ran Out of Food in the Last Year: Never true  Transportation Needs: No Transportation Needs (02/28/2024)   PRAPARE - Administrator, Civil Service (Medical): No  Lack of Transportation (Non-Medical): No  Physical Activity: Insufficiently Active (02/28/2024)   Exercise Vital Sign    Days of Exercise per Week: 3 days    Minutes of Exercise per Session: 20 min  Stress: No Stress Concern Present (02/28/2024)   [employer] of Occupational Health - Occupational Stress Questionnaire    Feeling of Stress: Not at all  Social Connections: Socially Isolated (02/28/2024)   Social Connection and Isolation Panel    Frequency of Communication with Friends and Family: More than three times a week    Frequency of Social Gatherings with Friends and Family: Once a week    Attends Religious Services: Patient declined    Doctor, general practice or Organizations: No    Attends Engineer, structural: Not on file    Marital Status: Divorced  Catering manager Violence: Not on file   Family History  Problem Relation Age of Onset   Alzheimer's disease Father        developed CAD late in life   High blood pressure Father    High Cholesterol Father    Heart disease Father    AAA (abdominal aortic aneurysm) Father    Stroke Mother        died @ ~65 y/o   CAD Mother 67       s/p CABG   High blood pressure Mother    High Cholesterol Mother    Heart disease Mother    Thyroid  disease Mother    Anxiety disorder Mother    Alcoholism Mother    Breast cancer Sister    ROS: All systems reviewed and negative except as per HPI.   Current Outpatient Medications  Medication Sig Dispense Refill   albuterol  (VENTOLIN  HFA) 108 (90 Base) MCG/ACT inhaler Inhale 2 puffs into the lungs every 6 (six) hours as needed for wheezing or shortness of breath. 6.7 g 5   aspirin EC 81 MG tablet Take 81 mg by mouth daily.     Cholecalciferol (VITAMIN D ) 50 MCG (2000 UT) tablet Take 2,000 Units by mouth daily.     cyclobenzaprine  (FLEXERIL ) 5 MG tablet Take 1 tablet by mouth every night as needed for pain. 30 tablet 0   dapagliflozin  propanediol (FARXIGA ) 10 MG TABS tablet Take 1 tablet (10 mg) by mouth daily before breakfast. 90 tablet 3   diclofenac  Sodium (VOLTAREN ) 1 % GEL Apply 2 g topically to the affected area(s) 4 (four) times daily. 100 g 0   fexofenadine  (ALLEGRA ) 180 MG tablet Take 180 mg by mouth daily.     furosemide  (LASIX ) 20 MG tablet Take 1 tablet (20 mg total) by mouth every other day. 30 tablet 3   guaiFENesin  (MUCINEX ) 600 MG 12 hr tablet Take 600 mg by mouth as needed.     levothyroxine  (SYNTHROID ) 125 MCG tablet Take 1 tablet (125 mcg total) by mouth daily before breakfast. 90 tablet 3   Magnesium 400 MG TABS Take 400 mg by mouth.     metroNIDAZOLE  (METROCREAM ) 0.75 % cream Apply a dime-sized amount as directed to face  twice a day. Taper use to daily once improved. 45 g 3   Multiple Vitamins-Minerals (PRESERVISION AREDS 2) CAPS Take 1 capsule by mouth 2 (two) times daily.     naproxen sodium (ALEVE) 220 MG tablet Take 220 mg by mouth daily as needed (pain).     propranolol  ER (INDERAL  LA) 60 MG 24 hr capsule Take 1 capsule (60 mg total) by mouth daily. Needs  appt for further refills 30 capsule 0   rosuvastatin  (CRESTOR ) 20 MG tablet Take 1 tablet (20 mg total) by mouth daily. 90 tablet 3   salmeterol (SEREVENT  DISKUS) 50 MCG/ACT diskus inhaler Inhale 1 puff into the lungs 2 times daily. 180 each 1   triamcinolone  cream (KENALOG ) 0.1 % Apply to back twice daily as needed for itch 454 g 1   valACYclovir  (VALTREX ) 500 MG tablet Take 1 tablet (500 mg total) by mouth as needed. 90 tablet 0   No current facility-administered medications for this visit.   Wt Readings from Last 3 Encounters:  12/07/23 103.9 kg (229 lb)  11/11/23 103.8 kg (228 lb 12.8 oz)  09/30/23 104.1 kg (229 lb 9.6 oz)   There were no vitals taken for this visit. General: NAD Neck: No JVD, no thyromegaly or thyroid  nodule.  Lungs: Mild crackles at bases.  CV: Nondisplaced PMI.  Heart regular S1/S2, no S3/S4, no murmur.  No peripheral edema.  No carotid bruit.  Normal pedal pulses.  Abdomen: Soft, nontender, no hepatosplenomegaly, no distention.  Skin: Intact without lesions or rashes.  Neurologic: Alert and oriented x 3.  Psych: Normal affect. Extremities: No clubbing or cyanosis.  HEENT: Normal.   Assessment/Plan: 1. Chronic diastolic CHF: Initial concern for pulmonary hypertension based on CT chest showing enlarged pulmonary artery.  Echo in 11/23 showed EF 60-65%, RV normal, unable to estimate PA systolic pressure. She does carry a diagnosis of COPD and was a prior smoker. RHC (2/24) showed mildly elevated PCWP and mild pulmonary venous hypertension. Suspect diastolic heart failure as findings not consistent with pulmonary arterial  hypertension. NYHA II symptoms, she is not volume overloaded by exam today.   - Continue Lasix  20 every other day.  BMET/BNP today.  - Continue Farxiga  10 mg daily.   2. CAD: Patient has known coronary calcification by prior CT chests.  She had a Cardiolite in 2020 with no ischemia.  She has no chest pain. - Continue Crestor  10 mg daily, check lipids today. 3. HTN: BP controlled.  4. Overweight: There is no height or weight on file to calculate BMI. - Needs to work on diet and exercise, probably could not get GLP-1 agonist at this time due to lack of diabetes.   Follow up in 6 months with APP.   I spent 21 minutes reviewing records, interviewing/examining patient, and managing orders.     Harlene HERO [facility]  05/10/2024

## 2024-05-11 ENCOUNTER — Telehealth (HOSPITAL_COMMUNITY): Payer: Self-pay

## 2024-05-11 ENCOUNTER — Encounter (HOSPITAL_COMMUNITY)

## 2024-05-11 NOTE — Telephone Encounter (Signed)
 Called to confirm/remind patient of their appointment at the [facility] on 05/12/24.   Appointment:   [x] Confirmed  [] Left mess   [] No answer/No voice mail  [] VM Full/unable to leave message  [] Phone not in service  Patient reminded to bring all medications and/or complete list.  Confirmed patient has transportation. Gave directions, instructed to utilize valet parking.

## 2024-05-12 ENCOUNTER — Encounter (HOSPITAL_COMMUNITY): Payer: Self-pay

## 2024-05-12 ENCOUNTER — Ambulatory Visit (HOSPITAL_COMMUNITY): Payer: Self-pay | Admitting: Family Medicine

## 2024-05-12 ENCOUNTER — Ambulatory Visit (HOSPITAL_COMMUNITY)
Admission: RE | Admit: 2024-05-12 | Discharge: 2024-05-12 | Disposition: A | Discharge status: Home / Self Care | Source: Ambulatory Visit | Attending: Family Medicine | Admitting: Family Medicine

## 2024-05-12 VITALS — BP 122/82 | HR 83 | Wt 218.4 lb

## 2024-05-12 DIAGNOSIS — E039 Hypothyroidism, unspecified: Secondary | ICD-10-CM | POA: Diagnosis not present

## 2024-05-12 DIAGNOSIS — I5032 Chronic diastolic (congestive) heart failure: Secondary | ICD-10-CM | POA: Diagnosis not present

## 2024-05-12 DIAGNOSIS — I10 Essential (primary) hypertension: Secondary | ICD-10-CM | POA: Diagnosis not present

## 2024-05-12 DIAGNOSIS — Z87891 Personal history of nicotine dependence: Secondary | ICD-10-CM | POA: Insufficient documentation

## 2024-05-12 DIAGNOSIS — I272 Pulmonary hypertension, unspecified: Secondary | ICD-10-CM | POA: Diagnosis not present

## 2024-05-12 DIAGNOSIS — R5383 Other fatigue: Secondary | ICD-10-CM | POA: Diagnosis not present

## 2024-05-12 DIAGNOSIS — Z6834 Body mass index (BMI) 34.0-34.9, adult: Secondary | ICD-10-CM | POA: Insufficient documentation

## 2024-05-12 DIAGNOSIS — Z79899 Other long term (current) drug therapy: Secondary | ICD-10-CM | POA: Diagnosis not present

## 2024-05-12 DIAGNOSIS — J449 Chronic obstructive pulmonary disease, unspecified: Secondary | ICD-10-CM | POA: Diagnosis not present

## 2024-05-12 DIAGNOSIS — Z7984 Long term (current) use of oral hypoglycemic drugs: Secondary | ICD-10-CM | POA: Insufficient documentation

## 2024-05-12 DIAGNOSIS — I110 Hypertensive heart disease with heart failure: Secondary | ICD-10-CM | POA: Diagnosis not present

## 2024-05-12 DIAGNOSIS — E669 Obesity, unspecified: Secondary | ICD-10-CM | POA: Insufficient documentation

## 2024-05-12 DIAGNOSIS — Z79890 Hormone replacement therapy (postmenopausal): Secondary | ICD-10-CM | POA: Insufficient documentation

## 2024-05-12 DIAGNOSIS — I2510 Atherosclerotic heart disease of native coronary artery without angina pectoris: Secondary | ICD-10-CM | POA: Diagnosis not present

## 2024-05-12 LAB — BRAIN NATRIURETIC PEPTIDE: B Natriuretic Peptide: 61.1 pg/mL (ref 0.0–100.0)

## 2024-05-12 LAB — BASIC METABOLIC PANEL WITH GFR
Anion gap: 12 (ref 5–15)
BUN: 20 mg/dL (ref 8–23)
CO2: 29 mmol/L (ref 22–32)
Calcium: 9.5 mg/dL (ref 8.9–10.3)
Chloride: 97 mmol/L — ABNORMAL LOW (ref 98–111)
Creatinine, Ser: 0.85 mg/dL (ref 0.44–1.00)
GFR, Estimated: >60 mL/min
Glucose, Bld: 109 mg/dL — ABNORMAL HIGH (ref 70–99)
Potassium: 5.7 mmol/L — ABNORMAL HIGH (ref 3.5–5.1)
Sodium: 138 mmol/L (ref 135–145)

## 2024-05-12 LAB — IRON AND TIBC
Iron: 100 ug/dL (ref 28–170)
Saturation Ratios: 23 % (ref 10.4–31.8)
TIBC: 437 ug/dL (ref 250–450)
UIBC: 337 ug/dL

## 2024-05-12 LAB — FERRITIN: Ferritin: 24 ng/mL (ref 11–307)

## 2024-05-12 LAB — CBC
HCT: 47.4 % — ABNORMAL HIGH (ref 36.0–46.0)
Hemoglobin: 15.5 g/dL — ABNORMAL HIGH (ref 12.0–15.0)
MCH: 29.4 pg (ref 26.0–34.0)
MCHC: 32.7 g/dL (ref 30.0–36.0)
MCV: 89.9 fL (ref 80.0–100.0)
Platelets: 319 K/uL (ref 150–400)
RBC: 5.27 MIL/uL — ABNORMAL HIGH (ref 3.87–5.11)
RDW: 13.5 % (ref 11.5–15.5)
WBC: 10.4 K/uL (ref 4.0–10.5)
nRBC: 0 % (ref 0.0–0.2)

## 2024-05-12 NOTE — Progress Notes (Signed)
 ReDS Vest / Clip - 05/12/24 1000       ReDS Vest / Clip   Station Marker D    Ruler Value 33    ReDS Value Range Low volume    ReDS Actual Value 30

## 2024-05-12 NOTE — Telephone Encounter (Addendum)
 Pt aware, agreeable, and verbalized understanding  Labs ordered and scheduled, Lokelma sample left up front for patient   Medication Samples have been provided to the patient.  Drug name: Lokelma       Strength: 10g        Qty: 1  LOT: [id number]   Exp.Date: 07/27  Dosing instructions: Take as directed by the heart failure clinic.  The patient has been instructed regarding the correct time, dose, and frequency of taking this medication, including desired effects and most common side effects.   Doss Cybulski M Nymir Ringler 1:38 PM 05/12/2024   ----- Message from Harlene CHRISTELLA Gainer sent at 05/12/2024 12:28 PM EDT ----- Stop ALL KCL supplements. ----- Message ----- From: Rebecka, Lab In [place] Sent: 05/12/2024  10:42 AM EDT To: Harlene CHRISTELLA Gainer, FNP

## 2024-05-12 NOTE — Patient Instructions (Signed)
 There has been no changes to your medications.  Labs done today, your results will be available in MyChart, we will contact you for abnormal readings.  Your physician has requested that you have an echocardiogram. Echocardiography is a painless test that uses sound waves to create images of your heart. It provides your doctor with information about the size and shape of your heart and how well your heart's chambers and valves are working. This procedure takes approximately one hour. There are no restrictions for this procedure. Please do NOT wear cologne, perfume, aftershave, or lotions (deodorant is allowed). Please arrive 15 minutes prior to your appointment time.  Please note: We ask at that you not bring children with you during ultrasound (echo/ vascular) testing. Due to room size and safety concerns, children are not allowed in the ultrasound rooms during exams. Our front office staff cannot provide observation of children in our lobby area while testing is being conducted. An adult accompanying a patient to their appointment will only be allowed in the ultrasound room at the discretion of the ultrasound technician under special circumstances. We apologize for any inconvenience.  Your physician recommends that you schedule a follow-up appointment in: 6 months ( March 2026) ** PLEASE CALL THE OFFICE IN JANUARY 2026 TO ARRANGE YOUR FOLLOW UP APPOINTMENT.**  If you have any questions or concerns before your next appointment please send us  a message through [facility] or call our office at [phone].    TO LEAVE A MESSAGE FOR THE NURSE SELECT OPTION 2, PLEASE LEAVE A MESSAGE INCLUDING: YOUR NAME DATE OF BIRTH CALL BACK NUMBER REASON FOR CALL**this is important as we prioritize the call backs  YOU WILL RECEIVE A CALL BACK THE SAME DAY AS LONG AS YOU CALL BEFORE 4:00 PM  At the [facility], you and your health needs are our priority. As part of our continuing mission to  provide you with exceptional heart care, we have created designated Provider Care Teams. These Care Teams include your primary Cardiologist (physician) and Advanced Practice Providers (APPs- Physician Assistants and Nurse Practitioners) who all work together to provide you with the care you need, when you need it.   You may see any of the following providers on your designated Care Team at your next follow up: Dr Toribio Fuel Dr Ezra Shuck Dr. Ria Commander Dr. Morene Brownie Amy Lenetta, NP Caffie Shed, [place] [facility] [place], [place] Beckey Coe, NP [place] Lee, NP Ellouise Class, NP Tinnie Redman, PharmD Jaun Bash, PharmD   Please be sure to bring in all your medications bottles to every appointment.    Thank you for choosing [facility]

## 2024-05-15 ENCOUNTER — Ambulatory Visit (HOSPITAL_COMMUNITY): Payer: Self-pay | Admitting: Family Medicine

## 2024-05-15 ENCOUNTER — Ambulatory Visit (HOSPITAL_COMMUNITY)
Admission: RE | Admit: 2024-05-15 | Discharge: 2024-05-15 | Disposition: A | Discharge status: Home / Self Care | Source: Ambulatory Visit | Attending: Cardiology | Admitting: Cardiology

## 2024-05-15 DIAGNOSIS — I5032 Chronic diastolic (congestive) heart failure: Secondary | ICD-10-CM | POA: Insufficient documentation

## 2024-05-15 LAB — BASIC METABOLIC PANEL WITH GFR
Anion gap: 9 (ref 5–15)
BUN: 22 mg/dL (ref 8–23)
CO2: 27 mmol/L (ref 22–32)
Calcium: 9.2 mg/dL (ref 8.9–10.3)
Chloride: 102 mmol/L (ref 98–111)
Creatinine, Ser: 0.75 mg/dL (ref 0.44–1.00)
GFR, Estimated: >60 mL/min (ref >60)
Glucose, Bld: 101 mg/dL — ABNORMAL HIGH (ref 70–99)
Potassium: 4.9 mmol/L (ref 3.5–5.1)
Sodium: 138 mmol/L (ref 135–145)

## 2024-06-01 ENCOUNTER — Ambulatory Visit (HOSPITAL_BASED_OUTPATIENT_CLINIC_OR_DEPARTMENT_OTHER)
Admission: RE | Admit: 2024-06-01 | Discharge: 2024-06-01 | Disposition: A | Discharge status: Home / Self Care | Source: Ambulatory Visit | Attending: Acute Care | Admitting: Acute Care

## 2024-06-01 DIAGNOSIS — Z87891 Personal history of nicotine dependence: Secondary | ICD-10-CM | POA: Diagnosis not present

## 2024-06-01 DIAGNOSIS — Z122 Encounter for screening for malignant neoplasm of respiratory organs: Secondary | ICD-10-CM | POA: Insufficient documentation

## 2024-06-04 ENCOUNTER — Other Ambulatory Visit: Payer: Self-pay | Admitting: Family Medicine

## 2024-06-06 ENCOUNTER — Other Ambulatory Visit (HOSPITAL_COMMUNITY): Payer: Self-pay

## 2024-06-06 ENCOUNTER — Other Ambulatory Visit: Payer: Self-pay

## 2024-06-06 MED ORDER — PROPRANOLOL HCL ER 60 MG PO CP24
60.0000 mg | ORAL_CAPSULE | Freq: Every day | ORAL | 0 refills | Status: DC
  Filled 2024-06-06: qty 30, 30d supply, fill #0

## 2024-06-09 ENCOUNTER — Ambulatory Visit (HOSPITAL_COMMUNITY)
Admission: RE | Admit: 2024-06-09 | Discharge: 2024-06-09 | Disposition: A | Discharge status: Home / Self Care | Source: Ambulatory Visit | Attending: Family Medicine | Admitting: Family Medicine

## 2024-06-09 DIAGNOSIS — I5032 Chronic diastolic (congestive) heart failure: Secondary | ICD-10-CM | POA: Diagnosis not present

## 2024-06-09 DIAGNOSIS — J449 Chronic obstructive pulmonary disease, unspecified: Secondary | ICD-10-CM | POA: Insufficient documentation

## 2024-06-09 DIAGNOSIS — I2510 Atherosclerotic heart disease of native coronary artery without angina pectoris: Secondary | ICD-10-CM | POA: Insufficient documentation

## 2024-06-09 DIAGNOSIS — I110 Hypertensive heart disease with heart failure: Secondary | ICD-10-CM | POA: Insufficient documentation

## 2024-06-09 LAB — ECHOCARDIOGRAM COMPLETE
AR max vel: 2.58 cm2
AV Area VTI: 2.39 cm2
AV Area mean vel: 2.38 cm2
AV Mean grad: 3 mmHg
AV Peak grad: 4.2 mmHg
Ao pk vel: 1.02 m/s
Area-P 1/2: 2.63 cm2
S' Lateral: 2.24 cm

## 2024-06-21 ENCOUNTER — Other Ambulatory Visit (HOSPITAL_COMMUNITY): Payer: Self-pay

## 2024-06-21 MED ORDER — TRAMADOL HCL 50 MG PO TABS
50.0000 mg | ORAL_TABLET | Freq: Four times a day (QID) | ORAL | 0 refills | Status: AC | PRN
  Filled 2024-06-21: qty 28, 7d supply, fill #0

## 2024-06-21 MED ORDER — ONDANSETRON 4 MG PO TBDP
4.0000 mg | ORAL_TABLET | Freq: Three times a day (TID) | ORAL | 0 refills | Status: AC | PRN
  Filled 2024-06-21: qty 14, 5d supply, fill #0

## 2024-06-28 ENCOUNTER — Other Ambulatory Visit (HOSPITAL_COMMUNITY): Payer: Self-pay

## 2024-06-28 MED ORDER — PROMETHAZINE HCL 12.5 MG PO TABS
12.5000 mg | ORAL_TABLET | Freq: Four times a day (QID) | ORAL | 0 refills | Status: AC
  Filled 2024-06-28 – 2024-07-15 (×2): qty 40, 5d supply, fill #0

## 2024-07-08 ENCOUNTER — Other Ambulatory Visit: Payer: Self-pay | Admitting: Family Medicine

## 2024-07-10 ENCOUNTER — Other Ambulatory Visit: Payer: Self-pay

## 2024-07-10 ENCOUNTER — Other Ambulatory Visit (HOSPITAL_COMMUNITY): Payer: Self-pay

## 2024-07-10 MED ORDER — PROPRANOLOL HCL ER 60 MG PO CP24
60.0000 mg | ORAL_CAPSULE | Freq: Every day | ORAL | 0 refills | Status: DC
  Filled 2024-07-10: qty 30, 30d supply, fill #0

## 2024-07-15 ENCOUNTER — Other Ambulatory Visit (HOSPITAL_COMMUNITY): Payer: Self-pay

## 2024-07-28 ENCOUNTER — Other Ambulatory Visit (HOSPITAL_COMMUNITY): Payer: Self-pay

## 2024-07-28 ENCOUNTER — Other Ambulatory Visit: Payer: Self-pay

## 2024-07-31 ENCOUNTER — Other Ambulatory Visit (HOSPITAL_COMMUNITY): Payer: Self-pay

## 2024-07-31 ENCOUNTER — Other Ambulatory Visit: Payer: Self-pay

## 2024-07-31 MED ORDER — CYCLOBENZAPRINE HCL 5 MG PO TABS
5.0000 mg | ORAL_TABLET | Freq: Every day | ORAL | 0 refills | Status: AC
  Filled 2024-07-31 (×2): qty 30, 30d supply, fill #0

## 2024-07-31 MED ORDER — METHYLPREDNISOLONE 4 MG PO TBPK
ORAL_TABLET | ORAL | 0 refills | Status: AC
  Filled 2024-07-31: qty 21, 6d supply, fill #0

## 2024-08-01 ENCOUNTER — Other Ambulatory Visit: Payer: Self-pay

## 2024-08-07 ENCOUNTER — Other Ambulatory Visit: Payer: Self-pay | Admitting: Family Medicine

## 2024-08-08 ENCOUNTER — Other Ambulatory Visit: Payer: Self-pay

## 2024-08-08 ENCOUNTER — Other Ambulatory Visit (HOSPITAL_COMMUNITY): Payer: Self-pay

## 2024-08-08 MED ORDER — PROPRANOLOL HCL ER 60 MG PO CP24
60.0000 mg | ORAL_CAPSULE | Freq: Every day | ORAL | 0 refills | Status: DC
  Filled 2024-08-08: qty 30, 30d supply, fill #0

## 2024-08-30 ENCOUNTER — Other Ambulatory Visit (HOSPITAL_COMMUNITY): Payer: Self-pay

## 2024-08-31 ENCOUNTER — Other Ambulatory Visit (HOSPITAL_COMMUNITY): Payer: Self-pay

## 2024-08-31 MED ORDER — NYSTATIN 100000 UNIT/GM EX CREA
1.0000 | TOPICAL_CREAM | Freq: Two times a day (BID) | CUTANEOUS | 3 refills | Status: AC
  Filled 2024-08-31: qty 30, 15d supply, fill #0

## 2024-08-31 MED ORDER — TRIAMCINOLONE ACETONIDE 0.1 % EX CREA
1.0000 | TOPICAL_CREAM | Freq: Two times a day (BID) | CUTANEOUS | 3 refills | Status: AC
  Filled 2024-08-31: qty 30, 15d supply, fill #0

## 2024-09-06 ENCOUNTER — Other Ambulatory Visit: Payer: Self-pay | Admitting: Family Medicine

## 2024-09-07 ENCOUNTER — Other Ambulatory Visit (HOSPITAL_COMMUNITY): Payer: Self-pay

## 2024-09-07 MED ORDER — PROPRANOLOL HCL ER 60 MG PO CP24
60.0000 mg | ORAL_CAPSULE | Freq: Every day | ORAL | 0 refills | Status: AC
  Filled 2024-09-07: qty 30, 30d supply, fill #0
